# Patient Record
Sex: Female | Born: 1940 | Race: Black or African American | Hispanic: No | Smoking: Never smoker
Health system: Southern US, Community
[De-identification: ages and names within clinical notes are randomized; demographics above are authoritative.]

## PROBLEM LIST (undated history)

## (undated) DIAGNOSIS — D860 Sarcoidosis of lung: Secondary | ICD-10-CM

## (undated) DIAGNOSIS — B029 Zoster without complications: Secondary | ICD-10-CM

## (undated) DIAGNOSIS — D649 Anemia, unspecified: Secondary | ICD-10-CM

## (undated) DIAGNOSIS — F419 Anxiety disorder, unspecified: Secondary | ICD-10-CM

## (undated) DIAGNOSIS — E785 Hyperlipidemia, unspecified: Secondary | ICD-10-CM

## (undated) DIAGNOSIS — I10 Essential (primary) hypertension: Secondary | ICD-10-CM

## (undated) DIAGNOSIS — C50919 Malignant neoplasm of unspecified site of unspecified female breast: Secondary | ICD-10-CM

## (undated) DIAGNOSIS — I472 Ventricular tachycardia, unspecified: Secondary | ICD-10-CM

## (undated) DIAGNOSIS — K219 Gastro-esophageal reflux disease without esophagitis: Secondary | ICD-10-CM

## (undated) DIAGNOSIS — G629 Polyneuropathy, unspecified: Secondary | ICD-10-CM

## (undated) DIAGNOSIS — Z9221 Personal history of antineoplastic chemotherapy: Secondary | ICD-10-CM

## (undated) DIAGNOSIS — I4729 Other ventricular tachycardia: Secondary | ICD-10-CM

## (undated) DIAGNOSIS — Z923 Personal history of irradiation: Secondary | ICD-10-CM

## (undated) DIAGNOSIS — R079 Chest pain, unspecified: Secondary | ICD-10-CM

## (undated) DIAGNOSIS — C801 Malignant (primary) neoplasm, unspecified: Secondary | ICD-10-CM

## (undated) DIAGNOSIS — R251 Tremor, unspecified: Secondary | ICD-10-CM

## (undated) HISTORY — PX: CHOLECYSTECTOMY: SHX55

## (undated) HISTORY — DX: Other ventricular tachycardia: I47.29

## (undated) HISTORY — DX: Chest pain, unspecified: R07.9

## (undated) HISTORY — PX: SKIN BIOPSY: SHX1

## (undated) HISTORY — DX: Tremor, unspecified: R25.1

## (undated) HISTORY — DX: Anemia, unspecified: D64.9

## (undated) HISTORY — DX: Anxiety disorder, unspecified: F41.9

## (undated) HISTORY — DX: Gastro-esophageal reflux disease without esophagitis: K21.9

## (undated) HISTORY — DX: Hyperlipidemia, unspecified: E78.5

## (undated) HISTORY — DX: Polyneuropathy, unspecified: G62.9

## (undated) HISTORY — DX: Ventricular tachycardia, unspecified: I47.20

## (undated) HISTORY — DX: Sarcoidosis of lung: D86.0

## (undated) HISTORY — PX: TUBAL LIGATION: SHX77

## (undated) HISTORY — DX: Zoster without complications: B02.9

## (undated) HISTORY — DX: Ventricular tachycardia: I47.2

---
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
## 1998-02-04 ENCOUNTER — Other Ambulatory Visit: Admission: RE | Admit: 1998-02-04 | Discharge: 1998-02-04 | Payer: Self-pay | Admitting: Emergency Medicine

## 1998-07-25 ENCOUNTER — Emergency Department (HOSPITAL_COMMUNITY): Admission: EM | Admit: 1998-07-25 | Discharge: 1998-07-25 | Payer: Self-pay | Admitting: Emergency Medicine

## 1998-08-24 ENCOUNTER — Emergency Department (HOSPITAL_COMMUNITY): Admission: EM | Admit: 1998-08-24 | Discharge: 1998-08-24 | Payer: Self-pay | Admitting: Emergency Medicine

## 1998-10-03 ENCOUNTER — Observation Stay (HOSPITAL_COMMUNITY): Admission: RE | Admit: 1998-10-03 | Discharge: 1998-10-04 | Payer: Self-pay

## 1999-04-04 ENCOUNTER — Other Ambulatory Visit: Admission: RE | Admit: 1999-04-04 | Discharge: 1999-04-04 | Payer: Self-pay | Admitting: Emergency Medicine

## 1999-12-10 ENCOUNTER — Ambulatory Visit (HOSPITAL_COMMUNITY): Admission: RE | Admit: 1999-12-10 | Discharge: 1999-12-10 | Payer: Self-pay | Admitting: Emergency Medicine

## 1999-12-12 ENCOUNTER — Ambulatory Visit (HOSPITAL_BASED_OUTPATIENT_CLINIC_OR_DEPARTMENT_OTHER): Admission: RE | Admit: 1999-12-12 | Discharge: 1999-12-12 | Payer: Self-pay

## 1999-12-12 ENCOUNTER — Encounter (INDEPENDENT_AMBULATORY_CARE_PROVIDER_SITE_OTHER): Payer: Self-pay | Admitting: Specialist

## 2000-06-03 ENCOUNTER — Encounter: Payer: Self-pay | Admitting: Emergency Medicine

## 2000-06-03 ENCOUNTER — Encounter: Admission: RE | Admit: 2000-06-03 | Discharge: 2000-06-03 | Payer: Self-pay | Admitting: Emergency Medicine

## 2000-06-09 ENCOUNTER — Encounter: Admission: RE | Admit: 2000-06-09 | Discharge: 2000-06-09 | Payer: Self-pay | Admitting: Emergency Medicine

## 2000-06-09 ENCOUNTER — Encounter: Payer: Self-pay | Admitting: Emergency Medicine

## 2001-04-05 ENCOUNTER — Encounter: Payer: Self-pay | Admitting: Emergency Medicine

## 2001-04-05 ENCOUNTER — Encounter: Admission: RE | Admit: 2001-04-05 | Discharge: 2001-04-05 | Payer: Self-pay | Admitting: Emergency Medicine

## 2001-07-26 ENCOUNTER — Ambulatory Visit (HOSPITAL_COMMUNITY): Admission: RE | Admit: 2001-07-26 | Discharge: 2001-07-26 | Payer: Self-pay | Admitting: Internal Medicine

## 2002-08-10 ENCOUNTER — Emergency Department (HOSPITAL_COMMUNITY): Admission: EM | Admit: 2002-08-10 | Discharge: 2002-08-10 | Payer: Self-pay | Admitting: Emergency Medicine

## 2002-10-19 DIAGNOSIS — C50919 Malignant neoplasm of unspecified site of unspecified female breast: Secondary | ICD-10-CM

## 2002-10-19 DIAGNOSIS — Z9221 Personal history of antineoplastic chemotherapy: Secondary | ICD-10-CM

## 2002-10-19 DIAGNOSIS — Z923 Personal history of irradiation: Secondary | ICD-10-CM

## 2002-10-19 HISTORY — DX: Personal history of irradiation: Z92.3

## 2002-10-19 HISTORY — DX: Malignant neoplasm of unspecified site of unspecified female breast: C50.919

## 2002-10-19 HISTORY — PX: OTHER SURGICAL HISTORY: SHX169

## 2002-10-19 HISTORY — DX: Personal history of antineoplastic chemotherapy: Z92.21

## 2002-10-19 HISTORY — PX: BREAST LUMPECTOMY: SHX2

## 2003-04-04 ENCOUNTER — Encounter (INDEPENDENT_AMBULATORY_CARE_PROVIDER_SITE_OTHER): Payer: Self-pay | Admitting: Diagnostic Radiology

## 2003-04-04 ENCOUNTER — Encounter (INDEPENDENT_AMBULATORY_CARE_PROVIDER_SITE_OTHER): Payer: Self-pay | Admitting: *Deleted

## 2003-04-04 ENCOUNTER — Encounter: Payer: Self-pay | Admitting: Internal Medicine

## 2003-04-04 ENCOUNTER — Encounter: Admission: RE | Admit: 2003-04-04 | Discharge: 2003-04-04 | Payer: Self-pay | Admitting: Internal Medicine

## 2003-04-04 ENCOUNTER — Other Ambulatory Visit: Admission: RE | Admit: 2003-04-04 | Discharge: 2003-04-04 | Payer: Self-pay

## 2003-04-20 ENCOUNTER — Encounter: Admission: RE | Admit: 2003-04-20 | Discharge: 2003-04-20 | Payer: Self-pay | Admitting: General Surgery

## 2003-04-20 ENCOUNTER — Encounter: Payer: Self-pay | Admitting: General Surgery

## 2003-04-24 ENCOUNTER — Encounter: Payer: Self-pay | Admitting: General Surgery

## 2003-04-24 ENCOUNTER — Encounter (INDEPENDENT_AMBULATORY_CARE_PROVIDER_SITE_OTHER): Payer: Self-pay | Admitting: *Deleted

## 2003-04-24 ENCOUNTER — Ambulatory Visit (HOSPITAL_BASED_OUTPATIENT_CLINIC_OR_DEPARTMENT_OTHER): Admission: RE | Admit: 2003-04-24 | Discharge: 2003-04-25 | Payer: Self-pay | Admitting: General Surgery

## 2003-05-15 ENCOUNTER — Ambulatory Visit: Admission: RE | Admit: 2003-05-15 | Discharge: 2003-06-29 | Payer: Self-pay | Admitting: *Deleted

## 2003-05-18 ENCOUNTER — Ambulatory Visit (HOSPITAL_COMMUNITY): Admission: RE | Admit: 2003-05-18 | Discharge: 2003-05-18 | Payer: Self-pay | Admitting: Oncology

## 2003-05-18 ENCOUNTER — Encounter: Payer: Self-pay | Admitting: Oncology

## 2003-05-23 ENCOUNTER — Encounter: Payer: Self-pay | Admitting: Cardiology

## 2003-05-23 ENCOUNTER — Ambulatory Visit: Admission: RE | Admit: 2003-05-23 | Discharge: 2003-05-23 | Payer: Self-pay | Admitting: Oncology

## 2003-06-18 ENCOUNTER — Ambulatory Visit (HOSPITAL_COMMUNITY): Admission: RE | Admit: 2003-06-18 | Discharge: 2003-06-18 | Payer: Self-pay | Admitting: General Surgery

## 2003-06-18 ENCOUNTER — Encounter: Payer: Self-pay | Admitting: General Surgery

## 2003-08-15 ENCOUNTER — Other Ambulatory Visit: Admission: RE | Admit: 2003-08-15 | Discharge: 2003-08-15 | Payer: Self-pay | Admitting: Oncology

## 2003-10-23 ENCOUNTER — Ambulatory Visit: Admission: RE | Admit: 2003-10-23 | Discharge: 2004-01-02 | Payer: Self-pay | Admitting: *Deleted

## 2003-10-29 ENCOUNTER — Ambulatory Visit (HOSPITAL_BASED_OUTPATIENT_CLINIC_OR_DEPARTMENT_OTHER): Admission: RE | Admit: 2003-10-29 | Discharge: 2003-10-29 | Payer: Self-pay | Admitting: General Surgery

## 2004-02-01 ENCOUNTER — Ambulatory Visit: Admission: RE | Admit: 2004-02-01 | Discharge: 2004-02-01 | Payer: Self-pay | Admitting: *Deleted

## 2004-04-07 ENCOUNTER — Encounter: Admission: RE | Admit: 2004-04-07 | Discharge: 2004-04-07 | Payer: Self-pay | Admitting: Oncology

## 2004-08-28 ENCOUNTER — Ambulatory Visit (HOSPITAL_COMMUNITY): Admission: RE | Admit: 2004-08-28 | Discharge: 2004-08-28 | Payer: Self-pay | Admitting: Oncology

## 2004-12-08 ENCOUNTER — Ambulatory Visit: Payer: Self-pay | Admitting: Internal Medicine

## 2005-01-05 ENCOUNTER — Ambulatory Visit: Payer: Self-pay | Admitting: Internal Medicine

## 2005-01-19 ENCOUNTER — Ambulatory Visit: Payer: Self-pay | Admitting: Oncology

## 2005-01-23 ENCOUNTER — Emergency Department (HOSPITAL_COMMUNITY): Admission: EM | Admit: 2005-01-23 | Discharge: 2005-01-23 | Payer: Self-pay | Admitting: Emergency Medicine

## 2005-02-06 ENCOUNTER — Ambulatory Visit: Payer: Self-pay | Admitting: Internal Medicine

## 2005-04-08 ENCOUNTER — Encounter: Admission: RE | Admit: 2005-04-08 | Discharge: 2005-04-08 | Payer: Self-pay | Admitting: Oncology

## 2005-04-30 ENCOUNTER — Ambulatory Visit: Payer: Self-pay | Admitting: Pulmonary Disease

## 2005-06-02 ENCOUNTER — Ambulatory Visit: Payer: Self-pay | Admitting: Internal Medicine

## 2005-07-20 ENCOUNTER — Ambulatory Visit: Payer: Self-pay | Admitting: Oncology

## 2005-07-27 ENCOUNTER — Ambulatory Visit (HOSPITAL_COMMUNITY): Admission: RE | Admit: 2005-07-27 | Discharge: 2005-07-27 | Payer: Self-pay | Admitting: Oncology

## 2005-08-26 ENCOUNTER — Ambulatory Visit: Payer: Self-pay | Admitting: Internal Medicine

## 2005-10-01 ENCOUNTER — Ambulatory Visit: Payer: Self-pay | Admitting: Internal Medicine

## 2005-11-10 ENCOUNTER — Ambulatory Visit: Payer: Self-pay | Admitting: Internal Medicine

## 2006-01-19 ENCOUNTER — Ambulatory Visit: Payer: Self-pay | Admitting: Oncology

## 2006-01-20 LAB — COMPREHENSIVE METABOLIC PANEL
AST: 30 U/L (ref 0–37)
Alkaline Phosphatase: 120 U/L — ABNORMAL HIGH (ref 39–117)
BUN: 12 mg/dL (ref 6–23)
Creatinine, Ser: 0.8 mg/dL (ref 0.4–1.2)
Glucose, Bld: 105 mg/dL — ABNORMAL HIGH (ref 70–99)
Potassium: 4.4 mEq/L (ref 3.5–5.3)
Total Bilirubin: 0.5 mg/dL (ref 0.3–1.2)

## 2006-01-20 LAB — CBC WITH DIFFERENTIAL/PLATELET
Basophils Absolute: 0 10*3/uL (ref 0.0–0.1)
EOS%: 2.4 % (ref 0.0–7.0)
Eosinophils Absolute: 0.1 10*3/uL (ref 0.0–0.5)
HGB: 12.9 g/dL (ref 11.6–15.9)
LYMPH%: 20.1 % (ref 14.0–48.0)
MCH: 28.5 pg (ref 26.0–34.0)
MCV: 85.7 fL (ref 81.0–101.0)
MONO%: 12.9 % (ref 0.0–13.0)
Platelets: 181 10*3/uL (ref 145–400)
RDW: 13.7 % (ref 11.3–14.5)

## 2006-01-20 LAB — CANCER ANTIGEN 27.29: CA 27.29: 30 U/mL (ref 0–39)

## 2006-02-23 ENCOUNTER — Ambulatory Visit: Payer: Self-pay | Admitting: Internal Medicine

## 2006-04-09 ENCOUNTER — Encounter: Admission: RE | Admit: 2006-04-09 | Discharge: 2006-04-09 | Payer: Self-pay | Admitting: Oncology

## 2006-06-30 ENCOUNTER — Ambulatory Visit: Payer: Self-pay | Admitting: Internal Medicine

## 2006-07-08 ENCOUNTER — Encounter: Admission: RE | Admit: 2006-07-08 | Discharge: 2006-07-08 | Payer: Self-pay | Admitting: Emergency Medicine

## 2006-07-19 ENCOUNTER — Ambulatory Visit: Payer: Self-pay | Admitting: Oncology

## 2006-08-17 ENCOUNTER — Ambulatory Visit (HOSPITAL_COMMUNITY): Admission: RE | Admit: 2006-08-17 | Discharge: 2006-08-17 | Payer: Self-pay | Admitting: Gastroenterology

## 2006-09-08 ENCOUNTER — Ambulatory Visit: Payer: Self-pay | Admitting: Internal Medicine

## 2006-11-30 ENCOUNTER — Ambulatory Visit: Payer: Self-pay | Admitting: Internal Medicine

## 2007-01-21 ENCOUNTER — Ambulatory Visit: Payer: Self-pay | Admitting: Oncology

## 2007-01-26 LAB — CBC WITH DIFFERENTIAL/PLATELET
BASO%: 0.3 % (ref 0.0–2.0)
Basophils Absolute: 0 10*3/uL (ref 0.0–0.1)
EOS%: 1.8 % (ref 0.0–7.0)
HGB: 13.4 g/dL (ref 11.6–15.9)
MCH: 29 pg (ref 26.0–34.0)
MCHC: 34.4 g/dL (ref 32.0–36.0)
MONO#: 0.6 10*3/uL (ref 0.1–0.9)
RDW: 13.7 % (ref 11.3–14.5)
WBC: 5.5 10*3/uL (ref 3.9–10.0)
lymph#: 1.1 10*3/uL (ref 0.9–3.3)

## 2007-03-07 ENCOUNTER — Ambulatory Visit: Payer: Self-pay | Admitting: Internal Medicine

## 2007-04-12 ENCOUNTER — Encounter: Admission: RE | Admit: 2007-04-12 | Discharge: 2007-04-12 | Payer: Self-pay | Admitting: Oncology

## 2007-05-23 ENCOUNTER — Emergency Department (HOSPITAL_COMMUNITY): Admission: EM | Admit: 2007-05-23 | Discharge: 2007-05-23 | Payer: Self-pay | Admitting: Emergency Medicine

## 2007-07-05 ENCOUNTER — Ambulatory Visit: Payer: Self-pay | Admitting: Internal Medicine

## 2007-07-18 ENCOUNTER — Ambulatory Visit: Payer: Self-pay | Admitting: Oncology

## 2007-07-20 LAB — CBC WITH DIFFERENTIAL/PLATELET
Basophils Absolute: 0 10*3/uL (ref 0.0–0.1)
Eosinophils Absolute: 0.1 10*3/uL (ref 0.0–0.5)
HGB: 13.1 g/dL (ref 11.6–15.9)
NEUT#: 2.6 10*3/uL (ref 1.5–6.5)
RDW: 13.5 % (ref 11.3–14.5)
lymph#: 0.8 10*3/uL — ABNORMAL LOW (ref 0.9–3.3)

## 2007-07-20 LAB — COMPREHENSIVE METABOLIC PANEL
Albumin: 3.6 g/dL (ref 3.5–5.2)
BUN: 14 mg/dL (ref 6–23)
CO2: 33 mEq/L — ABNORMAL HIGH (ref 19–32)
Calcium: 9.4 mg/dL (ref 8.4–10.5)
Chloride: 100 mEq/L (ref 96–112)
Glucose, Bld: 110 mg/dL — ABNORMAL HIGH (ref 70–99)
Potassium: 3.6 mEq/L (ref 3.5–5.3)

## 2007-07-20 LAB — CANCER ANTIGEN 27.29: CA 27.29: 19 U/mL (ref 0–39)

## 2007-11-04 ENCOUNTER — Ambulatory Visit: Payer: Self-pay | Admitting: Internal Medicine

## 2007-11-04 DIAGNOSIS — F419 Anxiety disorder, unspecified: Secondary | ICD-10-CM | POA: Insufficient documentation

## 2007-11-04 DIAGNOSIS — D860 Sarcoidosis of lung: Secondary | ICD-10-CM | POA: Insufficient documentation

## 2007-11-04 DIAGNOSIS — I471 Supraventricular tachycardia: Secondary | ICD-10-CM

## 2007-11-04 DIAGNOSIS — D869 Sarcoidosis, unspecified: Secondary | ICD-10-CM

## 2007-11-04 DIAGNOSIS — C50419 Malignant neoplasm of upper-outer quadrant of unspecified female breast: Secondary | ICD-10-CM

## 2007-11-04 DIAGNOSIS — D649 Anemia, unspecified: Secondary | ICD-10-CM | POA: Insufficient documentation

## 2007-11-04 DIAGNOSIS — K219 Gastro-esophageal reflux disease without esophagitis: Secondary | ICD-10-CM | POA: Insufficient documentation

## 2007-11-04 DIAGNOSIS — F411 Generalized anxiety disorder: Secondary | ICD-10-CM

## 2008-01-16 ENCOUNTER — Ambulatory Visit: Payer: Self-pay | Admitting: Oncology

## 2008-01-17 ENCOUNTER — Inpatient Hospital Stay (HOSPITAL_COMMUNITY): Admission: EM | Admit: 2008-01-17 | Discharge: 2008-01-18 | Payer: Self-pay | Admitting: Emergency Medicine

## 2008-01-18 ENCOUNTER — Encounter: Payer: Self-pay | Admitting: Internal Medicine

## 2008-01-25 ENCOUNTER — Encounter: Payer: Self-pay | Admitting: Internal Medicine

## 2008-01-25 LAB — COMPREHENSIVE METABOLIC PANEL
ALT: 32 U/L (ref 0–35)
AST: 30 U/L (ref 0–37)
CO2: 28 mEq/L (ref 19–32)
Calcium: 9.5 mg/dL (ref 8.4–10.5)
Chloride: 102 mEq/L (ref 96–112)
Sodium: 141 mEq/L (ref 135–145)
Total Bilirubin: 0.4 mg/dL (ref 0.3–1.2)
Total Protein: 7.3 g/dL (ref 6.0–8.3)

## 2008-01-25 LAB — CBC WITH DIFFERENTIAL/PLATELET
BASO%: 0.4 % (ref 0.0–2.0)
EOS%: 1.7 % (ref 0.0–7.0)
MCHC: 33.9 g/dL (ref 32.0–36.0)
MONO#: 0.5 10*3/uL (ref 0.1–0.9)
RBC: 4.38 10*6/uL (ref 3.70–5.32)
WBC: 5.2 10*3/uL (ref 3.9–10.0)
lymph#: 0.8 10*3/uL — ABNORMAL LOW (ref 0.9–3.3)

## 2008-01-25 LAB — LACTATE DEHYDROGENASE: LDH: 163 U/L (ref 94–250)

## 2008-04-12 ENCOUNTER — Encounter: Admission: RE | Admit: 2008-04-12 | Discharge: 2008-04-12 | Payer: Self-pay | Admitting: Internal Medicine

## 2008-07-12 ENCOUNTER — Ambulatory Visit: Payer: Self-pay | Admitting: Oncology

## 2008-07-19 LAB — CBC WITH DIFFERENTIAL/PLATELET
BASO%: 0.1 % (ref 0.0–2.0)
Basophils Absolute: 0 10*3/uL (ref 0.0–0.1)
EOS%: 2.4 % (ref 0.0–7.0)
Eosinophils Absolute: 0.2 10*3/uL (ref 0.0–0.5)
HCT: 40 % (ref 34.8–46.6)
HGB: 13.3 g/dL (ref 11.6–15.9)
LYMPH%: 18.8 % (ref 14.0–48.0)
MCH: 28.4 pg (ref 26.0–34.0)
MCHC: 33.1 g/dL (ref 32.0–36.0)
MCV: 85.7 fL (ref 81.0–101.0)
MONO#: 0.8 10*3/uL (ref 0.1–0.9)
MONO%: 11.9 % (ref 0.0–13.0)
NEUT#: 4.4 10*3/uL (ref 1.5–6.5)
NEUT%: 66.8 % (ref 39.6–76.8)
Platelets: 183 10*3/uL (ref 145–400)
RBC: 4.67 10*6/uL (ref 3.70–5.32)
RDW: 13.5 % (ref 11.3–14.5)
WBC: 6.6 10*3/uL (ref 3.9–10.0)
lymph#: 1.2 10*3/uL (ref 0.9–3.3)

## 2008-07-20 LAB — COMPREHENSIVE METABOLIC PANEL
Albumin: 4.1 g/dL (ref 3.5–5.2)
CO2: 28 mEq/L (ref 19–32)
Calcium: 9.4 mg/dL (ref 8.4–10.5)
Chloride: 98 mEq/L (ref 96–112)
Glucose, Bld: 100 mg/dL — ABNORMAL HIGH (ref 70–99)
Potassium: 3.7 mEq/L (ref 3.5–5.3)
Sodium: 138 mEq/L (ref 135–145)
Total Protein: 7.4 g/dL (ref 6.0–8.3)

## 2008-07-20 LAB — CANCER ANTIGEN 27.29: CA 27.29: 24 U/mL (ref 0–39)

## 2008-07-30 ENCOUNTER — Ambulatory Visit: Payer: Self-pay | Admitting: Internal Medicine

## 2008-07-30 DIAGNOSIS — J441 Chronic obstructive pulmonary disease with (acute) exacerbation: Secondary | ICD-10-CM

## 2008-12-20 ENCOUNTER — Telehealth (INDEPENDENT_AMBULATORY_CARE_PROVIDER_SITE_OTHER): Payer: Self-pay | Admitting: *Deleted

## 2008-12-20 ENCOUNTER — Emergency Department (HOSPITAL_COMMUNITY): Admission: EM | Admit: 2008-12-20 | Discharge: 2008-12-20 | Payer: Self-pay | Admitting: Emergency Medicine

## 2008-12-25 ENCOUNTER — Encounter: Payer: Self-pay | Admitting: Internal Medicine

## 2009-01-07 ENCOUNTER — Ambulatory Visit: Payer: Self-pay | Admitting: Internal Medicine

## 2009-01-22 ENCOUNTER — Ambulatory Visit: Payer: Self-pay | Admitting: Oncology

## 2009-01-24 LAB — COMPREHENSIVE METABOLIC PANEL
Albumin: 4.2 g/dL (ref 3.5–5.2)
Alkaline Phosphatase: 124 U/L — ABNORMAL HIGH (ref 39–117)
BUN: 19 mg/dL (ref 6–23)
Glucose, Bld: 117 mg/dL — ABNORMAL HIGH (ref 70–99)
Potassium: 3.3 mEq/L — ABNORMAL LOW (ref 3.5–5.3)
Total Bilirubin: 0.3 mg/dL (ref 0.3–1.2)

## 2009-01-24 LAB — CBC WITH DIFFERENTIAL/PLATELET
Basophils Absolute: 0 10*3/uL (ref 0.0–0.1)
Eosinophils Absolute: 0.2 10*3/uL (ref 0.0–0.5)
HCT: 37.3 % (ref 34.8–46.6)
HGB: 12.6 g/dL (ref 11.6–15.9)
LYMPH%: 20.9 % (ref 14.0–49.7)
MCV: 85.1 fL (ref 79.5–101.0)
MONO%: 11.4 % (ref 0.0–14.0)
NEUT#: 4.3 10*3/uL (ref 1.5–6.5)
NEUT%: 65.2 % (ref 38.4–76.8)
Platelets: 191 10*3/uL (ref 145–400)

## 2009-01-25 ENCOUNTER — Encounter: Payer: Self-pay | Admitting: Internal Medicine

## 2009-01-31 ENCOUNTER — Encounter: Payer: Self-pay | Admitting: Internal Medicine

## 2009-04-15 ENCOUNTER — Encounter: Admission: RE | Admit: 2009-04-15 | Discharge: 2009-04-15 | Payer: Self-pay | Admitting: Oncology

## 2009-06-02 ENCOUNTER — Emergency Department (HOSPITAL_COMMUNITY): Admission: EM | Admit: 2009-06-02 | Discharge: 2009-06-02 | Payer: Self-pay | Admitting: Emergency Medicine

## 2009-08-28 ENCOUNTER — Telehealth: Payer: Self-pay | Admitting: Internal Medicine

## 2009-08-30 ENCOUNTER — Ambulatory Visit: Payer: Self-pay | Admitting: Internal Medicine

## 2009-08-30 DIAGNOSIS — G250 Essential tremor: Secondary | ICD-10-CM

## 2010-02-27 ENCOUNTER — Ambulatory Visit: Payer: Self-pay | Admitting: Internal Medicine

## 2010-02-27 DIAGNOSIS — H409 Unspecified glaucoma: Secondary | ICD-10-CM | POA: Insufficient documentation

## 2010-04-08 DIAGNOSIS — H6120 Impacted cerumen, unspecified ear: Secondary | ICD-10-CM | POA: Insufficient documentation

## 2010-04-16 ENCOUNTER — Encounter: Admission: RE | Admit: 2010-04-16 | Discharge: 2010-04-16 | Payer: Self-pay | Admitting: Surgery

## 2010-05-05 ENCOUNTER — Emergency Department (HOSPITAL_COMMUNITY): Admission: EM | Admit: 2010-05-05 | Discharge: 2010-05-05 | Payer: Self-pay | Admitting: Emergency Medicine

## 2010-07-22 ENCOUNTER — Telehealth (INDEPENDENT_AMBULATORY_CARE_PROVIDER_SITE_OTHER): Payer: Self-pay | Admitting: *Deleted

## 2010-08-02 ENCOUNTER — Emergency Department (HOSPITAL_COMMUNITY)
Admission: EM | Admit: 2010-08-02 | Discharge: 2010-08-02 | Payer: Self-pay | Source: Home / Self Care | Admitting: Emergency Medicine

## 2010-11-09 ENCOUNTER — Encounter: Payer: Self-pay | Admitting: Oncology

## 2010-11-18 NOTE — Assessment & Plan Note (Signed)
Summary: rov 6 months///kp   Primary Provider/Referring Provider:  R. Avva  CC:  Follow up visit-sarcoidosis;"breathing okay"..  History of Present Illness:  01/07/09- Sarcoid, chronic bronchitis, hx breast ca 2 weeks ago went to ER for rash. they attrib to sarcoid and put her on pred taper. Had seen Dr Felipa Eth first and was given triamcinolone cream which darkened her skin. Went to Dr Terri Piedra for derm bx with result pending. Rash mostly on arms, and some on left leg. Pruritic at first but no longer. Chest ok without cough, fever or night sweats, no swollen glands.  August 30, 2009- Sarcoid, chronic bronchits, hx breast ca, tachycardia Had sharp pains across shoulder blades. She saw cardiologist Dr Lynnea Ferrier yest -"OK" . He had recommended surgical evaluation ? ablation, but for now she is on diltiazem which makes her tired. Pain is now gone. It started while sitting on bed 2 days ago. She is off prednisone, and never filled Xopenex HFA, Denies wheeze or cough. Tremor is chronic- bothersome. We discussed trial of temazepam. Needs flu vax  Feb 27, 2010- Sarcoid, chronic bronchitis, hx breast ca, tachycardia CXR- 08/30/09- Stable sarcoid and XRT changes in right lung. Had heart cath - Dr Lynnea Ferrier- last Fall, and stress test in April " both good". Coughs a little with scant phlegm.. Notes some dyspnea with exertion but unchanged. Denies chest pain, nodes, fever, blood.      Current Medications (verified): 1)  Lotemax 0.5 %  Susp (Loteprednol Etabonate) .... As Directed 2)  Timoptic 0.5 % Soln (Timolol Maleate) .... Place 1 Drop Into Left Eye Every Morning 3)  Hydrochlorothiazide 12.5 Mg  Tabs (Hydrochlorothiazide) .... Take 1 Tablet By Mouth Once A Day 4)  Diovan 160 Mg  Tabs (Valsartan) .... Take 1 Tablet By Mouth Once A Day 5)  Omeprazole 20 Mg Cpdr (Omeprazole) .... Take 1 By Mouth Once Daily For Reflux 6)  Aspirin 81 Mg Tbec (Aspirin) .... Take 1 By Mouth Once Daily 7)  Diazepam 2 Mg  Tabs (Diazepam) .[name].. 1 or 2, Up To Three Times A Day As Needed Tremor  Allergies (verified): 1)  ! Claritin 2)  ! 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  Married- separated x 30 yrs  Risk Factors: Smoking Status: never (01/07/2009)  Review of Systems      See HPI       The patient complains of dyspnea.  The patient denies fever, chills, cough, hempotysis, wheezing, chest discomfort, pleuritic chest pain, cyanosis, weakness, fatigue, joint pain/swelling, muscle pain, photosensitivity, dry eyes, dry mouth, and Raynaud's phenomenon.    Vital Signs:  Patient profile:   70 year old female Height:      65 inches Weight:      144 pounds BMI:     24.05 O2 Sat:      100 % on Room air Pulse rate:   96 / minute BP sitting:   120 / 68  (left arm) Cuff size:   regular  Vitals Entered By: Reynaldo Minium CMA (Feb 27, 2010 9:49 AM)  O2 Flow:  Room air  Physical Exam  Additional Exam:  General: A/Ox3; pleasant and cooperative, NAD, SKIN:clear NODES: no  lymphadenopathy HEENT: [place]/AT, EOM- WNL, Conjuctivae- clear, PERRLA, TM-WNL, Nose- clear, Throat- clear and wnl, missing teeth NECK: Supple w/ fair ROM, JVD- none, normal carotid impulses w/o bruits Thyroid- normal to palpation CHEST: Unlabored diffuse wheeze and squeeks right posterior - she doesn't hear it, old Port A Cath scar left ant chest. HEART: RRR, no m/g/r heard ABDOMEN: ZOX:WRUE, nl pulses, no edema  NEURO: Grossly intact to observation        Impression &  Recommendations:  Problem # 1:  PULMONARY SARCOIDOSIS (ICD-135) Old scarring but relatively asymptomatic. Watching bronchoconstrictor potential from her timoptic, but she seems not to be having a problem. PFT was reviewed from 2009. I don't think we would gain much for her with a bronchodilator. We will continue to watch at long intervals.  Medications Added to Medication List This Visit: 1)  Aspirin 81 Mg Tbec (Aspirin) .... Take 1 by mouth once daily  Other Orders: Est. Patient Level III (45409)  Patient Instructions: 1)  Please schedule a follow-up appointment in 1 year. 2)  Call for help sooner if needed.

## 2010-11-18 NOTE — Progress Notes (Signed)
Summary: diazepam rx > ok per CDY  Phone Note Call from Patient   Caller: Patient Call For: young Summary of Call: pt says her rx ran out [facility] diazepam. [facility] [address] [address] pt # [id number] Initial call taken by: Tivis Ringer, CNA,  July 22, 2010 2:12 PM  Follow-up for Phone Call        pt last seen by [facility] 02/27/2010.  Next appt scheduled for 02/28/2011.  Pt was last given rx for Diazepam 08/30/2009 for # 30 x 5 refills.  Please advise if ok to refill or not.  Thank you. Arman Filter LPN  July 22, 2010 2:22 PM   Additional Follow-up for Phone Call Additional follow up Details #1::        OK to refill the diazepam script as we did las November Additional Follow-up by: Waymon Budge MD,  July 22, 2010 5:25 PM    Additional Follow-up for Phone Call Additional follow up Details #2::    called spoke with patient, advised of pending rx okayed per CDY.  pt verbalized her understanding. Boone Master CNA/MA  July 22, 2010 5:30 PM   Prescriptions: DIAZEPAM 2 MG TABS (DIAZEPAM) 1 or 2, up to three times a day as needed tremor  #30 x 5   Entered by:   Boone Master CNA/MA   Authorized by:   Waymon Budge MD   Signed by:   Boone Master CNA/MA on 07/22/2010   Method used:   Telephoned to ...       [facility]  [phone]* (retail)       [address] [place], [place]  [address]       Ph: [phone] or [phone]       Fax: [phone]   RxID:   [id number]

## 2010-12-29 ENCOUNTER — Ambulatory Visit (INDEPENDENT_AMBULATORY_CARE_PROVIDER_SITE_OTHER): Payer: Medicare Other | Admitting: Internal Medicine

## 2010-12-29 ENCOUNTER — Ambulatory Visit (INDEPENDENT_AMBULATORY_CARE_PROVIDER_SITE_OTHER)
Admission: RE | Admit: 2010-12-29 | Discharge: 2010-12-29 | Disposition: A | Discharge status: Home / Self Care | Payer: Medicare Other | Source: Ambulatory Visit | Attending: Internal Medicine | Admitting: Internal Medicine

## 2010-12-29 ENCOUNTER — Encounter: Payer: Self-pay | Admitting: Internal Medicine

## 2010-12-29 ENCOUNTER — Other Ambulatory Visit: Payer: Self-pay | Admitting: Internal Medicine

## 2010-12-29 DIAGNOSIS — D869 Sarcoidosis, unspecified: Secondary | ICD-10-CM

## 2010-12-29 DIAGNOSIS — J42 Unspecified chronic bronchitis: Secondary | ICD-10-CM

## 2010-12-29 DIAGNOSIS — C50919 Malignant neoplasm of unspecified site of unspecified female breast: Secondary | ICD-10-CM

## 2010-12-29 DIAGNOSIS — K219 Gastro-esophageal reflux disease without esophagitis: Secondary | ICD-10-CM

## 2010-12-30 ENCOUNTER — Telehealth (INDEPENDENT_AMBULATORY_CARE_PROVIDER_SITE_OTHER): Payer: Self-pay | Admitting: *Deleted

## 2010-12-31 LAB — DIFFERENTIAL
Basophils Absolute: 0 10*3/uL (ref 0.0–0.1)
Basophils Relative: 0 % (ref 0–1)
Eosinophils Absolute: 0.1 10*3/uL (ref 0.0–0.7)
Eosinophils Relative: 2 % (ref 0–5)
Monocytes Absolute: 0.6 10*3/uL (ref 0.1–1.0)
Neutro Abs: 5.1 10*3/uL (ref 1.7–7.7)

## 2010-12-31 LAB — URINALYSIS, ROUTINE W REFLEX MICROSCOPIC
Glucose, UA: NEGATIVE mg/dL
Protein, ur: NEGATIVE mg/dL

## 2010-12-31 LAB — URINE CULTURE
Colony Count: 75000
Culture  Setup Time: 201110151920

## 2010-12-31 LAB — CBC
HCT: 39.9 % (ref 36.0–46.0)
MCH: 28.2 pg (ref 26.0–34.0)
MCHC: 33.1 g/dL (ref 30.0–36.0)
MCV: 85.1 fL (ref 78.0–100.0)
RDW: 14.1 % (ref 11.5–15.5)

## 2010-12-31 LAB — URINE MICROSCOPIC-ADD ON

## 2010-12-31 LAB — GLUCOSE, CAPILLARY: Glucose-Capillary: 114 mg/dL — ABNORMAL HIGH (ref 70–99)

## 2011-01-03 LAB — URINALYSIS, ROUTINE W REFLEX MICROSCOPIC
Bilirubin Urine: NEGATIVE
Ketones, ur: NEGATIVE mg/dL
Nitrite: NEGATIVE
Urobilinogen, UA: 0.2 mg/dL (ref 0.0–1.0)

## 2011-01-03 LAB — COMPREHENSIVE METABOLIC PANEL
ALT: 19 U/L (ref 0–35)
Albumin: 3.9 g/dL (ref 3.5–5.2)
Alkaline Phosphatase: 105 U/L (ref 39–117)
BUN: 13 mg/dL (ref 6–23)
Chloride: 102 mEq/L (ref 96–112)
Glucose, Bld: 115 mg/dL — ABNORMAL HIGH (ref 70–99)
Potassium: 3.4 mEq/L — ABNORMAL LOW (ref 3.5–5.1)
Sodium: 141 mEq/L (ref 135–145)
Total Bilirubin: 1 mg/dL (ref 0.3–1.2)

## 2011-01-03 LAB — GLUCOSE, CAPILLARY: Glucose-Capillary: 125 mg/dL — ABNORMAL HIGH (ref 70–99)

## 2011-01-06 NOTE — Progress Notes (Signed)
Summary: pt would like results of chest xray from yesterday  Phone Note Call from Patient   Caller: Patient Call For: Young Summary of Call: Patient phoned stated that she was seen yesterday and had a chest xray and she would like the results. She can be reached at [phone] Initial call taken by: Vedia Coffer,  December 30, 2010 3:11 PM  Follow-up for Phone Call        called and spoke with pt.  pt aware of cxr results.  Arman Filter LPN  December 30, 2010 3:37 PM

## 2011-01-06 NOTE — Assessment & Plan Note (Signed)
Summary: cough///kp   Primary Provider/Referring Provider:  R. Avva  CC:  Acute visit-cough-white in color and pain on both sides under rib cage area and as well as chills; denies any SOB or wheezing or fever.[name]  History of Present Illness: August 30, 2009- Sarcoid, chronic bronchits, hx breast ca, tachycardia Had sharp pains across shoulder blades. She saw cardiologist Dr Lynnea Ferrier yest -"OK" . He had recommended surgical evaluation ? ablation, but for now she is on diltiazem which makes her tired. Pain is now gone. It started while sitting on bed 2 days ago. She is off prednisone, and never filled Xopenex HFA, Denies wheeze or cough. Tremor is chronic- bothersome. We discussed trial of temazepam. Needs flu vax  Feb 27, 2010- Sarcoid, chronic bronchitis, hx breast ca, tachycardia CXR- 08/30/09- Stable sarcoid and XRT changes in right lung. Had heart cath - Dr Lynnea Ferrier- last Fall, and stress test in April " both good". Coughs a little with scant phlegm.. Notes some dyspnea with exertion but unchanged. Denies chest pain, nodes, fever, blood.  01-15-2011- Sarcoid, chronic bronchitis, hx breast ca, tachycardia Nurse-CC: Acute visit-cough-white in color and pain on both sides under rib cage area, as well as chills; denies any SOB or wheezing or fever. acute- 2 weeks w/o distinct onset. Pains under lateral rib edges, dry cough, eyes water, blowing nose. No sneeze or shortness of breath, sore throat, fever, nodes or hearburn. Sputum white. Chill w/o fever. GI ok.        Preventive Screening-Counseling & Management  Alcohol-Tobacco     Smoking Status: never  Current Medications (verified): 1)  Lotemax 0.5 %  Susp (Loteprednol Etabonate) .... As Directed 2)  Timoptic 0.5 % Soln (Timolol Maleate) .... Place 1 Drop Into Left Eye Every Morning 3)  Hydrochlorothiazide 25 Mg Tabs (Hydrochlorothiazide) .... Take 1 By Mouth Once Daily 4)  Diovan 320 Mg Tabs (Valsartan) .... Take 1 By Mouth  Once Daily 5)  Omeprazole 20 Mg Cpdr (Omeprazole) .... Take 1 By Mouth Once Daily For Reflux 6)  Aspirin 81 Mg Tbec (Aspirin) .... Take 1 By Mouth Once Daily 7)  Simvastatin 40 Mg Tabs (Simvastatin) .... Take 1 By Mouth At Bedtime  Allergies (verified): 1)  ! Claritin 2)  ! 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  The patient denies anorexia, fever, weight gain, vision loss, decreased hearing, hoarseness, syncope, peripheral edema, prolonged cough, headaches, hemoptysis, abdominal pain, melena, severe indigestion/heartburn, muscle weakness, difficulty walking, depression, unusual weight change, and abnormal bleeding.         Dieting weigtht down 5 lbs  Vital Signs:  Patient profile:   70 year old female Height:      65 inches Weight:      139.25 pounds BMI:     23.26 O2 Sat:  99 % on Room air Pulse rate:   87 / minute BP sitting:   126 / 78  (left arm) Cuff size:   regular  Vitals Entered By: Reynaldo Minium CMA (December 29, 2010 10:10 AM)  O2 Flow:  Room air CC: Acute visit-cough-white in color and pain on both sides under rib cage area, as well as chills; denies any SOB or  wheezing or fever.   Physical Exam  Additional Exam:  General: A/Ox3; pleasant and cooperative, NAD, SKIN:clear NODES: no lymphadenopathy HEENT: [place]/AT, EOM- WNL, Conjuctivae- clear, PERRLA, TM-WNL, Nose- clear, Throat- clear and wnl, missing teeth NECK: Supple w/ fair ROM, JVD- none, normal carotid impulses w/o bruits Thyroid- normal to palpation CHEST:Unlabored, faint wheeze, no rub. , old Port A Cath scar left ant chest. HEART: RRR, no m/g/r heard ABDOMEN:not overweight ZOX:WRUE, nl pulses, no edema  NEURO: Grossly intact to observation        Impression & Recommendations:  Problem # 1:  PULMONARY SARCOIDOSIS (ICD-135) Doubt reactivation. current symptoms more typical of a mild bronchitis from viral or allergic trigger.  I reassured her. she doesn't feel sick or worried. We will get CXr and treat symptomatically with tylenol, heating pad and fluids.   Problem # 2:  ESOPHAGEAL REFLUX (ICD-530.81) I reminded her ot typical reflux symptoms and prevention measures.  Her updated medication list for this problem includes:    Omeprazole 20 Mg Cpdr (Omeprazole) .[name]... Take 1 by mouth once daily for reflux  Problem # 3:  CARCINOMA, BREAST (ICD-174.9) She says there has been no sign of recurence and she is off treatment now.   Medications Added to Medication List This Visit: 1)  Hydrochlorothiazide 25 Mg Tabs (Hydrochlorothiazide) .... Take 1 by mouth once daily 2)  Diovan 320 Mg Tabs (Valsartan) .... Take 1 by mouth once daily 3)  Simvastatin 40 Mg Tabs (Simvastatin) .... Take 1 by mouth at bedtime  Other Orders: Est. Patient Level III (45409) T-2 View CXR (71020TC)  Patient Instructions: 1)  Keep scheduled appointment in May unless needed sooner. 2)  A chest x-ray has been recommended.  Your imaging study may require preauthorization.  3)  Treat for comfort- rest, fluids, tylenol, heating pad as needed.

## 2011-01-27 ENCOUNTER — Encounter (HOSPITAL_COMMUNITY): Payer: Self-pay | Admitting: Radiology

## 2011-01-27 ENCOUNTER — Emergency Department (HOSPITAL_COMMUNITY): Payer: Medicare Other

## 2011-01-27 ENCOUNTER — Emergency Department (HOSPITAL_COMMUNITY)
Admission: EM | Admit: 2011-01-27 | Discharge: 2011-01-27 | Disposition: A | Discharge status: Home / Self Care | Payer: Medicare Other | Attending: Emergency Medicine | Admitting: Emergency Medicine

## 2011-01-27 DIAGNOSIS — F411 Generalized anxiety disorder: Secondary | ICD-10-CM | POA: Insufficient documentation

## 2011-01-27 DIAGNOSIS — R079 Chest pain, unspecified: Secondary | ICD-10-CM | POA: Insufficient documentation

## 2011-01-27 DIAGNOSIS — Z79899 Other long term (current) drug therapy: Secondary | ICD-10-CM | POA: Insufficient documentation

## 2011-01-27 DIAGNOSIS — D869 Sarcoidosis, unspecified: Secondary | ICD-10-CM | POA: Insufficient documentation

## 2011-01-27 DIAGNOSIS — I10 Essential (primary) hypertension: Secondary | ICD-10-CM | POA: Insufficient documentation

## 2011-01-27 DIAGNOSIS — R0989 Other specified symptoms and signs involving the circulatory and respiratory systems: Secondary | ICD-10-CM | POA: Insufficient documentation

## 2011-01-27 DIAGNOSIS — R0609 Other forms of dyspnea: Secondary | ICD-10-CM | POA: Insufficient documentation

## 2011-01-27 DIAGNOSIS — R799 Abnormal finding of blood chemistry, unspecified: Secondary | ICD-10-CM | POA: Insufficient documentation

## 2011-01-27 DIAGNOSIS — M25519 Pain in unspecified shoulder: Secondary | ICD-10-CM | POA: Insufficient documentation

## 2011-01-27 DIAGNOSIS — Z853 Personal history of malignant neoplasm of breast: Secondary | ICD-10-CM | POA: Insufficient documentation

## 2011-01-27 DIAGNOSIS — M79609 Pain in unspecified limb: Secondary | ICD-10-CM | POA: Insufficient documentation

## 2011-01-27 HISTORY — DX: Essential (primary) hypertension: I10

## 2011-01-27 HISTORY — DX: Malignant (primary) neoplasm, unspecified: C80.1

## 2011-01-27 LAB — POCT I-STAT, CHEM 8
BUN: 15 mg/dL (ref 6–23)
Calcium, Ion: 1.06 mmol/L — ABNORMAL LOW (ref 1.12–1.32)
HCT: 42 % (ref 36.0–46.0)
Hemoglobin: 14.3 g/dL (ref 12.0–15.0)
Sodium: 136 mEq/L (ref 135–145)
TCO2: 27 mmol/L (ref 0–100)

## 2011-01-27 LAB — DIFFERENTIAL
Basophils Relative: 0 % (ref 0–1)
Eosinophils Absolute: 0.1 10*3/uL (ref 0.0–0.7)
Eosinophils Relative: 2 % (ref 0–5)
Monocytes Relative: 7 % (ref 3–12)
Neutrophils Relative %: 67 % (ref 43–77)

## 2011-01-27 LAB — CBC
MCH: 27.4 pg (ref 26.0–34.0)
MCHC: 32.7 g/dL (ref 30.0–36.0)
MCV: 83.6 fL (ref 78.0–100.0)
Platelets: 164 10*3/uL (ref 150–400)
RBC: 4.64 MIL/uL (ref 3.87–5.11)
RDW: 13.4 % (ref 11.5–15.5)

## 2011-01-27 LAB — POCT CARDIAC MARKERS
CKMB, poc: <1 ng/mL — ABNORMAL LOW (ref 1.0–8.0)
Troponin i, poc: <0.05 ng/mL (ref 0.00–0.09)

## 2011-01-27 MED ORDER — IOHEXOL 300 MG/ML  SOLN
70.0000 mL | Freq: Once | INTRAMUSCULAR | Status: AC | PRN
  Administered 2011-01-27: 70 mL via INTRAVENOUS

## 2011-02-27 ENCOUNTER — Ambulatory Visit: Payer: Self-pay | Admitting: Internal Medicine

## 2011-03-03 NOTE — Discharge Summary (Signed)
NAMEHYDIE, LANGAN NO.:  [id number]   MEDICAL RECORD NO.:  [id number]          PATIENT TYPE:  INP   LOCATION:  3709                         FACILITY:  [facility]   PHYSICIAN:  Lezlie Octave, N.P.     DATE OF BIRTH:  08-Sep-1941   DATE OF ADMISSION:  01/17/2008  DATE OF DISCHARGE:  01/18/2008                               DISCHARGE SUMMARY   Ms. Danielle Peters is a 70 year old African American female patient who  was seen in the office of Dr. Ritta Slot, she was in SVT.  In the  office, she was given carotid massage which did not bring her heart  rate.  She was given metoprolol 50 mg and waited for 1 hour and a half.  However, her heart rate still did not break then she was admitted to the  hospital.  In the ER, she was again in SVT, which was probably atrial  flutter, and she was given 20 mg bolus of diltiazem, and she converted  to sinus rhythm.  It was decided to keep her overnight and adjust her  medications.  We started p.o. diltiazem and beta-blocker, held her HCTZ  and her Diovan.  The following day, she was seen by Dr. Lynnea Ferrier, who  decided she could be discharged home with outpatient follow up.   LABORATORY DATA:  Hemoglobin 13, hematocrit 38.5, WBCs 5.6, and  platelets 140.  Sodium 140, potassium 3.5, BUN 9, creatinine 0.82,  chloride 104, and CO2 30.  Her AST was 58 and ALT was 28.  CK-MBs and  troponins were negative x3.  TSH was 0.812.  Chest x-ray showed a  bihilar adenopathy, compatible sarcoid, scarring in the right anterior  lung, probably due to the prior radiation and no cardiopulmonary  disease.   DISCHARGE MEDICATIONS:  1. Metoprolol 25 mg twice per day.  2. Diltiazem 30 mg twice per day.  3. Aspirin 81 mg a day.  4. Lotemax eye drops.  5. Atenolol eye drops.  6. Prilosec 20 mg a day.   She will follow up in our office for Persantine Myoview on January 30, 2008, and then follow up with Dr. Lynnea Ferrier on January 31, 2008.   DISCHARGE  DIAGNOSES:  1. PA flutter with rapid ventricular response.  2. History of supraventricular tachycardia since 2004.  3. Right breast lumpectomy for breast cancer.  4. Sarcoidosis followed by Dr. Jetty Duhamel.  5. Hypertension.  6. Gastroesophageal reflux disease.      Lezlie Octave, N.P.     BB/MEDQ  D:  02/24/2008  T:  02/25/2008  Job:  [id number]   cc:   Joni Fears D. Young, MD, FCCP, FACP  Avvva R. Joylene Draft, MD

## 2011-03-03 NOTE — Assessment & Plan Note (Signed)
[facility]                             PULMONARY OFFICE NOTE   NAME:Grisso, BELLAMARIE PFLUG                     MRN:          [id number]  DATE:07/05/2007                            DOB:          1941-05-02    PROBLEM LIST:  1. Pulmonary sarcoidosis.  2. Right breast cancer/radiation therapy/chemotherapy.  3. Anemia.  4. Esophageal reflux.  5. Paroxysmal ventricular tachycardia.  6. Anxiety.   HISTORY:  She went to the emergency room in early August because of  chest pain.  Workup there included a chest x-ray with no acute findings  and a chest CT, negative for pulmonary embolism, but now showing changes  of sarcoid.  She was diagnosed with musculoskeletal pain.  She now  thinks she has a cold, mainly because she has been coughing some with  the current cool weather.  There is no fever, adenopathy, sore throat,  or significant sputum.  Her nose has been a bit congested.  She is going  to an aerobic exercise class once a week at the [facility].   MEDICATIONS:  1. Mobic 15 mg.  2. Lotemax.  3. Timolol eye drops.  4. Hydrochlorothiazide 12.5 mg.  5. Diovan 160 mg .  6. Lorazepam 0.5 mg b.i.d.  7. Celexa 20 mg.  8. Flexeril p.r.n. use.  9. Codeine cough syrup.  10.Atrovent inhaler.   Drug intolerant to ASPIRIN, CLARITIN, and ALBUTEROL which had sent her  to the emergency room with tachycardia in the past.   OBJECTIVE:  Weight is up 138 pounds, BP 124/80, pulse 84, room air  saturation 99%.  Squeaks and rhonchi in both posterior bases.  Work of breathing is not  increased.  I do not hear pleural rub or dullness.  I do not find  adenopathy.  Heart sounds are regular without murmur or gallop.  There is no tremor.  Oropharynx is clear.  Nasal airway is not  substantially obstructed.   IMPRESSION:  Anxiety component persists.  She does have significant  pulmonary parenchymal scarring from stage IV sarcoid.  Minor increase in  cough now is  nonspecific, but I do not think it requires we do very  much.  She does ask that we change her cough medicine prescription back  to Tussionex, and after some discussion I did so.   PLAN:  Schedule pulmonary function test.  Tussionex 5 ml q.12 h. p.r.n.  occasional use for cough.  Schedule return 4 months, earlier p.r.n.     Clinton D. Maple Hudson, MD, Tonny Bollman, FACP  Electronically Signed    CDY/MedQ  DD: 07/05/2007  DT: 07/06/2007  Job #: [id number]   cc:   Larina Earthly, M.D.

## 2011-03-03 NOTE — Assessment & Plan Note (Signed)
[facility]                             PULMONARY OFFICE NOTE   NAME:Danielle Peters, Danielle ELLIOTT                     MRN:          [id number]  DATE:03/07/2007                            DOB:          May 04, 1941    PULMONARY OFFICE FOLLOWUP.   PROBLEM LIST:  1. Pulmonary sarcoidosis.  2. Right breast cancer/radiation therapy/chemotherapy.  3. Anemia.  4. Esophageal reflux.  5. Paroxysmal ventricular tachycardia.  6. Anxiety.   HISTORY:  We gave her some 2-mg diazepam for anxiety in February.  She  tried it.  Dr. Felipa Eth preferred that she try Celexa but she says that  gives heartburn.  I would rather leave this management to him.  She  notices some aching in the left intercostal margin and says chest x-ray  and EKG were done to evaluate and that she is due for a followup chest x-  ray.  She was told the blood work is quote excellent.  She had an  upper endoscopy with Dr. Randa Evens in September.   MEDICATIONS:  Her list is reviewed and charted.  Significant today for  Atrovent HFA used p.r.n.  She does timolol eye drops which can aggravate  bronchospasm but she seems to be tolerating it well.   OBJECTIVE:  Weight 122 pounds, blood pressure 130/84, pulse 85, room air  saturation 97%.  A few scattered squeaks and wheezes.  Heart sounds:  Regular without murmur.  I do not find adenopathy.  She is tender to  pressure along the left side of her sternum consistent with  costochondritis, and this seems to duplicate the soreness she was  talking about currently.   IMPRESSION:  1. Burned out sarcoid with mediastinal and hilar adenopathy and some      nodular pleural thickening in the right apex which needs to be      watched.  Chest x-rays are being done elsewhere.  2. Musculoskeletal chest wall pain consistent with costochondritis.   PLAN:  We are scheduling a pulmonary function test.  She will return in  four months.  She is encouraged to follow closely with Dr.  Felipa Eth.    Clinton D. Maple Hudson, MD, Tonny Bollman, FACP  Electronically Signed   CDY/MedQ  DD: 03/14/2007  DT: 03/14/2007  Job #: [id number]   cc:   Larina Earthly, M.D.

## 2011-03-06 NOTE — Op Note (Signed)
[name], Danielle Peters                        ACCOUNT NO.:  [id number]   MEDICAL RECORD NO.:  [id number]                   PATIENT TYPE:  AMB   LOCATION:  DSC                                  FACILITY:  [facility]   PHYSICIAN:  Rose Phi. Maple Hudson, M.D.                DATE OF BIRTH:  18-Jul-1941   DATE OF PROCEDURE:  DATE OF DISCHARGE:                                 OPERATIVE REPORT   PREOPERATIVE DIAGNOSIS:  Carcinoma of the breast.   POSTOPERATIVE DIAGNOSIS:  Carcinoma of the breast.   OPERATION PERFORMED:  Removal of Port-A-Cath.   SURGEON:  Rose Phi. Maple Hudson, M.D.   ANESTHESIA:  Local.   DESCRIPTION OF PROCEDURE:  The patient was placed on the operating table and  the left upper chest prepped and draped in the usual fashion.  After  infiltrating the area with 1% Xylocaine with Adrenalin, an incision was made  and we exposed the port.  I could identify the two sutures holding it in  place, divided those, grasped the catheter and removed it and then removed  the port.  There was no bleeding.  Subcuticular closure with 4-0 Monocryl  and Steri-Strips.  Dressing applied.  The patient was then allowed to go  home.                                               Rose Phi. Maple Hudson, M.D.    PRY/MEDQ  D:  10/29/2003  T:  10/29/2003  Job:  [id number]

## 2011-03-24 ENCOUNTER — Other Ambulatory Visit: Payer: Self-pay | Admitting: Internal Medicine

## 2011-03-24 DIAGNOSIS — Z1231 Encounter for screening mammogram for malignant neoplasm of breast: Secondary | ICD-10-CM

## 2011-04-20 ENCOUNTER — Ambulatory Visit
Admission: RE | Admit: 2011-04-20 | Discharge: 2011-04-20 | Disposition: A | Discharge status: Home / Self Care | Payer: Medicare Other | Source: Ambulatory Visit | Attending: Internal Medicine | Admitting: Internal Medicine

## 2011-04-20 DIAGNOSIS — Z1231 Encounter for screening mammogram for malignant neoplasm of breast: Secondary | ICD-10-CM

## 2011-07-13 LAB — DIFFERENTIAL
Basophils Absolute: 0
Basophils Relative: 0
Eosinophils Absolute: 0
Monocytes Absolute: 0.5
Monocytes Relative: 8
Neutro Abs: 5.3
Neutrophils Relative %: 76

## 2011-07-13 LAB — CARDIAC PANEL(CRET KIN+CKTOT+MB+TROPI)
Relative Index: INVALID
Relative Index: INVALID
Total CK: 78
Troponin I: 0.05

## 2011-07-13 LAB — COMPREHENSIVE METABOLIC PANEL
ALT: 28
Alkaline Phosphatase: 107
BUN: 11
CO2: 24
Chloride: 101
Glucose, Bld: 128 — ABNORMAL HIGH
Potassium: 4.1
Sodium: 138
Total Bilirubin: 0.9
Total Protein: 7.9

## 2011-07-13 LAB — TSH: TSH: 0.88

## 2011-07-13 LAB — CBC
HCT: 45.3
Hemoglobin: 15.1 — ABNORMAL HIGH
RBC: 5.32 — ABNORMAL HIGH
WBC: 7

## 2011-07-13 LAB — PROTIME-INR
INR: 0.9
Prothrombin Time: 12.7

## 2011-07-14 ENCOUNTER — Telehealth: Payer: Self-pay | Admitting: Internal Medicine

## 2011-07-14 LAB — BASIC METABOLIC PANEL
BUN: 9
CO2: 30
Calcium: 9.3
Chloride: 104
Creatinine, Ser: 0.82

## 2011-07-14 LAB — CBC
MCHC: 33.7
MCV: 84.7
Platelets: 140 — ABNORMAL LOW
RDW: 13.2

## 2011-07-14 LAB — CARDIAC PANEL(CRET KIN+CKTOT+MB+TROPI)
Relative Index: INVALID
Troponin I: 0.03

## 2011-07-14 NOTE — Telephone Encounter (Signed)
Spoke with patient-aware to be here Thursday at 11am for 1115am appt with CY for allergies.

## 2011-07-22 ENCOUNTER — Ambulatory Visit: Payer: Medicare Other | Admitting: Rehabilitative and Restorative Service Providers"

## 2011-07-23 ENCOUNTER — Encounter: Payer: Self-pay | Admitting: Internal Medicine

## 2011-07-23 ENCOUNTER — Ambulatory Visit (INDEPENDENT_AMBULATORY_CARE_PROVIDER_SITE_OTHER)
Admission: RE | Admit: 2011-07-23 | Discharge: 2011-07-23 | Disposition: A | Discharge status: Home / Self Care | Payer: Medicare Other | Source: Ambulatory Visit | Attending: Internal Medicine | Admitting: Internal Medicine

## 2011-07-23 ENCOUNTER — Other Ambulatory Visit: Payer: Medicare Other

## 2011-07-23 ENCOUNTER — Ambulatory Visit (INDEPENDENT_AMBULATORY_CARE_PROVIDER_SITE_OTHER): Payer: Medicare Other | Admitting: Internal Medicine

## 2011-07-23 VITALS — BP 138/80 | HR 86 | Ht 65.0 in | Wt 142.0 lb

## 2011-07-23 DIAGNOSIS — D869 Sarcoidosis, unspecified: Secondary | ICD-10-CM

## 2011-07-23 DIAGNOSIS — C50919 Malignant neoplasm of unspecified site of unspecified female breast: Secondary | ICD-10-CM

## 2011-07-23 DIAGNOSIS — Z23 Encounter for immunization: Secondary | ICD-10-CM

## 2011-07-23 NOTE — Progress Notes (Signed)
07/23/11- 70 year old female never smoker followed for sarcoid, chronic bronchitis complicated by history of right breast cancer, tachycardia, GERD, anxiety, glaucoma.. Last here- 12/29/2010. She is here today with concern about an intermittent pain at the inferior aspect of her right scapula, noticed over the past month. It is increased by stress. She also feels tender across the lower left inferior costal margin and she notices occasional heartburn. She recently restarted exercise classes at the [facility]. He denies fever productive cough sore throat nodes or exertional chest pain. She recently had cardiology followup at [facility] where she was told heart problems might be from sarcoid. She doesn't understand anything specific about that.  ROS- Constitutional:   No-   weight loss, night sweats, fevers, chills, fatigue, lassitude. HEENT:   No-  headaches, difficulty swallowing, tooth/dental problems, sore throat,       No-  sneezing, itching, ear ache, nasal congestion, post nasal drip,  CV:  No-   chest pain, orthopnea, PND, swelling in lower extremities, anasarca, dizziness, palpitations Resp: No-   shortness of breath with exertion or at rest.              No-   productive cough,  No non-productive cough,  No-  coughing up of blood.              No-   change in color of mucus.  No- wheezing.   Skin: No-   rash or lesions. GI:  No-   heartburn, indigestion, abdominal pain, nausea, vomiting, diarrhea,                 change in bowel habits, loss of appetite GU: No-   dysuria, change in color of urine, nocturia x3.  No- flank pain. MS:  No-   joint pain or swelling.  No- decreased range of motion.  No- back pain. 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 No depression or anxiety.  No memory loss.  Obj General- Alert, Oriented, Affect-appropriate, Distress- none acute Skin- rash-none, lesions- none, excoriation-  none Lymphadenopathy- none Head- atraumatic            Eyes- Gross vision intact, PERRLA, conjunctivae clear secretions            Ears- Hearing, canals-normal            Nose- Clear, no-Septal dev, mucus, polyps, erosion, perforation             Throat- Mallampati II , mucosa clear , drainage- none, tonsils- atrophic. Missing teeth Neck- flexible , trachea midline, no stridor , thyroid nl, carotid no bruit Chest - symmetrical excursion , unlabored           Heart/CV- RRR with no extra beats , no murmur , no gallop  , no rub, nl s1 s2                           - JVD- none , edema- none, stasis changes- none, varices- none           Lung- coarse wet sounding rhonchi bilaterally to the mid lung zones. Work of breathing is not increased. cough- none , dullness-none, rub- none           Chest wall-  Abd- tender-no, distended-no, bowel sounds-present, HSM- no Br/ Gen/ Rectal- Not done, not indicated Extrem- cyanosis- none, clubbing, none, atrophy- none, strength- nl Neuro- grossly intact to  observation, except for resting tremor of both hands

## 2011-07-23 NOTE — Assessment & Plan Note (Signed)
Treated with radiation therapy chemotherapy and lumpectomy 2004

## 2011-07-23 NOTE — Patient Instructions (Addendum)
Order- lab- ACE level for dx sarcoid                    CXR  Flu vax

## 2011-07-23 NOTE — Assessment & Plan Note (Signed)
At her age I would expect sarcoid to be burned out. We need to understand better. 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 There may be some radiation change underlying the treatment area of her right breast. She has had similar musculoskeletal pains for at least 2 years and I don't think this is related to the sarcoid, or progressive. Plan angiotensin-converting enzyme level, chest x-ray, flu vaccine.

## 2011-08-03 LAB — POCT I-STAT CREATININE
Creatinine, Ser: 0.8
Operator id: 285841

## 2011-08-03 LAB — POCT CARDIAC MARKERS
CKMB, poc: 1.3
Myoglobin, poc: 35.8
Operator id: 285841
Troponin i, poc: <0.05

## 2011-08-03 LAB — I-STAT 8, (EC8 V) (CONVERTED LAB)
Acid-Base Excess: 5 — ABNORMAL HIGH
BUN: 11
Bicarbonate: 30.1 — ABNORMAL HIGH
Chloride: 102
Glucose, Bld: 116 — ABNORMAL HIGH
HCT: 47 — ABNORMAL HIGH
Hemoglobin: 16 — ABNORMAL HIGH
Operator id: 285841
Potassium: 3.9
Sodium: 136
TCO2: 31
pCO2, Ven: 42.9 — ABNORMAL LOW
pH, Ven: 7.455 — ABNORMAL HIGH

## 2011-09-03 ENCOUNTER — Ambulatory Visit (INDEPENDENT_AMBULATORY_CARE_PROVIDER_SITE_OTHER): Payer: Medicare Other | Admitting: Internal Medicine

## 2011-09-03 ENCOUNTER — Encounter: Payer: Self-pay | Admitting: Internal Medicine

## 2011-09-03 VITALS — BP 122/66 | HR 85 | Ht 65.0 in | Wt 142.6 lb

## 2011-09-03 DIAGNOSIS — D869 Sarcoidosis, unspecified: Secondary | ICD-10-CM

## 2011-09-03 NOTE — Patient Instructions (Signed)
Consider Delsym or your favorite otc cough syrup if needed  Please call as needed  Ask your doctor if another medicine could be tried for tremor  We will recheck your chest xray and sarcoid test next time unless you have problems sooner.

## 2011-09-03 NOTE — Progress Notes (Signed)
07/23/11- 70 year old female never smoker followed for sarcoid, chronic bronchitis complicated by history of right breast cancer, tachycardia, GERD, anxiety, glaucoma.. Last here- 12/29/2010. She is here today with concern about an intermittent pain at the inferior aspect of her right scapula, noticed over the past month. It is increased by stress. She also feels tender across the lower left inferior costal margin and she notices occasional heartburn. She recently restarted exercise classes at the [facility]. He denies fever productive cough sore throat nodes or exertional chest pain. She recently had cardiology followup at [facility] heart and vascular where she was told heart problems might be from sarcoid. She doesn't understand anything specific about that.  09/03/11-  70 year old female never smoker followed for sarcoid, chronic bronchitis complicated by history of right breast cancer, tachycardia, GERD, anxiety, glaucoma.. She has felt well with no acute issues. She does comment on annoying tremor of her hands. Treated by neurology with primidone which caused headache and rapid heart rate. Cold air increased her cough a little bit. She does not think she has an infection and denies fever, sweats, swollen glands, pain or purulent discharge. ACE level -07/23/11- 61 which may indicate low grade activity despite her age. CXR- 07/23/2011-patchy density right middle lobe, suspected to be sarcoid by the radiologist. We discussed the fact that this is in the field of radiation therapy for right breast cancer and maybe radiation change in the underlying lung.   ROS- see HPI Constitutional:   No-   weight loss, night sweats, fevers, chills, fatigue, lassitude. HEENT:   No-  headaches, difficulty swallowing, tooth/dental problems, sore throat,       No-  sneezing, itching, ear ache, nasal congestion, post nasal drip,  CV:  No-   chest pain, orthopnea, PND, swelling in lower extremities, anasarca,  dizziness, palpitations Resp: No-   shortness of breath with exertion or at rest.              No-   productive cough,  No non-productive cough,  No-  coughing up of blood.              No-   change in color of mucus.  No- wheezing.   Skin: No-   rash or lesions. GI:  No-   heartburn, indigestion, abdominal pain, nausea, vomiting, diarrhea,                 change in bowel habits, loss of appetite GU: No-   dysuria, change in color of urine, nocturia x3.  No- flank pain. MS:  No-   joint pain or swelling.  No- decreased range of motion.  No- back pain. 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 No depression or anxiety.  No memory loss.  Obj General- Alert, Oriented, Affect-appropriate, Distress- none acute Skin- rash-none, lesions- none, excoriation- none Lymphadenopathy- none Head- atraumatic            Eyes- Gross vision intact, PERRLA, conjunctivae clear secretions            Ears- Hearing, canals-normal            Nose- Clear, no-Septal dev, mucus, polyps, erosion, perforation             Throat- Mallampati II , mucosa clear , drainage- none, tonsils- atrophic. Missing teeth Neck- flexible , trachea midline, no stridor , thyroid nl, carotid no bruit Chest - symmetrical excursion , unlabored  Heart/CV- RRR with no extra beats , no murmur , no gallop  , no rub, nl s1 s2                           - JVD- none , edema- none, stasis changes- none, varices- none           Lung- coarse wet sounding rhonchi especially in the right mid lung. Work of breathing is not increased. cough- none , dullness-none, rub- none           Chest wall-  Abd- tender-no, distended-no, bowel sounds-present, HSM- no Br/ Gen/ Rectal- Not done, not indicated Extrem- cyanosis- none, clubbing, none, atrophy- none, strength- nl Neuro- grossly intact to observation, except for resting tremor of both hands

## 2011-09-06 NOTE — Assessment & Plan Note (Signed)
Right middle lobe infiltrate of uncertain etiology. Sarcoid is possible but at this age, less likely despite her mildly elevated ACE level. This seems to relate to be seen progressive changes related to her radiation therapy, but we need to watch for definite evidence of progression. She understands and agrees to this.

## 2011-12-04 ENCOUNTER — Ambulatory Visit (INDEPENDENT_AMBULATORY_CARE_PROVIDER_SITE_OTHER)
Admission: RE | Admit: 2011-12-04 | Discharge: 2011-12-04 | Disposition: A | Discharge status: Home / Self Care | Payer: Medicare Other | Source: Ambulatory Visit | Attending: Internal Medicine | Admitting: Internal Medicine

## 2011-12-04 ENCOUNTER — Encounter: Payer: Self-pay | Admitting: Internal Medicine

## 2011-12-04 ENCOUNTER — Ambulatory Visit (INDEPENDENT_AMBULATORY_CARE_PROVIDER_SITE_OTHER): Payer: Medicare Other | Admitting: Internal Medicine

## 2011-12-04 ENCOUNTER — Other Ambulatory Visit: Payer: Medicare Other

## 2011-12-04 VITALS — BP 126/74 | HR 85 | Ht 65.0 in | Wt 141.0 lb

## 2011-12-04 DIAGNOSIS — J42 Unspecified chronic bronchitis: Secondary | ICD-10-CM

## 2011-12-04 DIAGNOSIS — H409 Unspecified glaucoma: Secondary | ICD-10-CM

## 2011-12-04 DIAGNOSIS — D869 Sarcoidosis, unspecified: Secondary | ICD-10-CM

## 2011-12-04 NOTE — Progress Notes (Signed)
07/23/11- 71 year old female never smoker followed for sarcoid, chronic bronchitis complicated by history of right breast cancer, tachycardia, GERD, anxiety, glaucoma.. Last here- 12/29/2010. She is here today with concern about an intermittent pain at the inferior aspect of her right scapula, noticed over the past month. It is increased by stress. She also feels tender across the lower left inferior costal margin and she notices occasional heartburn. She recently restarted exercise classes at the [facility]. She denies fever productive cough sore throat nodes or exertional chest pain. She recently had cardiology followup at [facility] heart and vascular where she was told heart problems might be from sarcoid. She doesn't understand anything specific about that.  09/03/11-  71 year old female never smoker followed for sarcoid, chronic bronchitis complicated by history of right breast cancer, tachycardia, GERD, anxiety, glaucoma.. She has felt well with no acute issues. She does comment on annoying tremor of her hands. Treated by neurology with primidone which caused headache and rapid heart rate. Cold air increased her cough a little bit. She does not think she has an infection and denies fever, sweats, swollen glands, pain or purulent discharge. ACE level -07/23/11- 61 which may indicate low grade activity despite her age. CXR- 07/23/2011-patchy density right middle lobe, suspected to be sarcoid by the radiologist. We discussed the fact that this is in the field of radiation therapy for right breast cancer and may be radiation change in the underlying lung.  12/04/11-  71 year old female never smoker followed for sarcoid, chronic bronchitis complicated by history of right breast cancer, tachycardia, GERD, anxiety, glaucoma.[name]  PCP Dr Felipa Eth She keeps a mild cough which she feels in the left upper chest, productive of small amounts of white sputum with no pain fever or blood. Lites night sweats especially  if she is before bedtime.  Cough syrup causes "funny breathing" without exertional dyspnea. [facility] With exercise she gets an ache in the left upper chest which will persist for about a day. Continues Timoptic eyedrops for glaucoma.  ROS- see HPI Constitutional:   No-   weight loss, night sweats, fevers, chills, fatigue, lassitude. HEENT:   No-  headaches, difficulty swallowing, tooth/dental problems, sore throat,       No-  sneezing, itching, ear ache, nasal congestion, post nasal drip,  CV:  + chest pain, orthopnea, PND, swelling in lower extremities, anasarca, dizziness, palpitations Resp: No-   shortness of breath with exertion or at rest.             +  productive cough,  No non-productive cough,  No-  coughing up of blood.              No-   change in color of mucus.  No- wheezing.   Skin: No-   rash or lesions. GI:  No-   heartburn, indigestion, abdominal pain, nausea, vomiting, diarrhea,                 change in bowel habits, loss of appetite GU:. MS:  No-   joint pain or swelling.  No- decreased range of motion.  No- back pain. 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 No depression or anxiety.  No memory loss.  Obj General- Alert, Oriented, Affect-appropriate, Distress- none acute Skin- rash-none, lesions- none, excoriation- none Lymphadenopathy- none Head- atraumatic            Eyes- Gross vision intact, PERRLA, conjunctivae clear secretions  Ears- Hearing, canals-normal            Nose- Clear, no-Septal dev, mucus, polyps, erosion, perforation             Throat- Mallampati II , mucosa clear , drainage- none, tonsils- atrophic. Missing teeth Neck- flexible , trachea midline, no stridor , thyroid nl, carotid no bruit Chest - symmetrical excursion , unlabored           Heart/CV- RRR with no extra beats , no murmur , no gallop  , no rub, nl s1 s2                           - JVD- none ,  edema- none, stasis changes- none, varices- none           Lung- bilateral diffuse squeaks, unlabored. Work of breathing is not increased. cough- none , dullness-none, rub- none           Chest wall-  Abd-  Br/ Gen/ Rectal- Not done, not indicated Extrem- cyanosis- none, clubbing, none, atrophy- none, strength- nl Neuro- grossly intact to observation, except for resting tremor of both hands

## 2011-12-04 NOTE — Patient Instructions (Signed)
Order - lab ACE level                         dx sarcoid              CXR                Schedule PFT and 6 MWT

## 2011-12-05 LAB — ANGIOTENSIN CONVERTING ENZYME: Angiotensin-Converting Enzyme: 5 U/L — ABNORMAL LOW (ref 8–52)

## 2011-12-07 ENCOUNTER — Encounter: Payer: Self-pay | Admitting: Internal Medicine

## 2011-12-07 NOTE — Assessment & Plan Note (Signed)
We will continue to track for sarcoid activity but her age makes it unlikely.

## 2011-12-07 NOTE — Assessment & Plan Note (Signed)
We discussed Timoptic as a potentiator of bronchospasm.

## 2011-12-07 NOTE — Assessment & Plan Note (Signed)
Chronic bronchitis, partly due to her sarcoid scarring. Question if density seen in right lung reflected radiation therapy.

## 2011-12-21 ENCOUNTER — Ambulatory Visit (INDEPENDENT_AMBULATORY_CARE_PROVIDER_SITE_OTHER): Payer: Medicare Other | Admitting: Internal Medicine

## 2011-12-21 DIAGNOSIS — R0600 Dyspnea, unspecified: Secondary | ICD-10-CM

## 2011-12-21 DIAGNOSIS — D869 Sarcoidosis, unspecified: Secondary | ICD-10-CM

## 2011-12-21 DIAGNOSIS — R0609 Other forms of dyspnea: Secondary | ICD-10-CM

## 2011-12-21 LAB — PULMONARY FUNCTION TEST

## 2011-12-21 NOTE — Progress Notes (Signed)
PFT done today. 

## 2011-12-25 NOTE — Progress Notes (Signed)
Documentation for 6 minute walk test 

## 2012-01-01 ENCOUNTER — Ambulatory Visit (INDEPENDENT_AMBULATORY_CARE_PROVIDER_SITE_OTHER): Payer: Medicare Other | Admitting: Internal Medicine

## 2012-01-01 ENCOUNTER — Encounter: Payer: Self-pay | Admitting: Internal Medicine

## 2012-01-01 VITALS — BP 130/74 | HR 94 | Ht 65.0 in | Wt 140.8 lb

## 2012-01-01 DIAGNOSIS — J42 Unspecified chronic bronchitis: Secondary | ICD-10-CM

## 2012-01-01 DIAGNOSIS — D869 Sarcoidosis, unspecified: Secondary | ICD-10-CM

## 2012-01-01 MED ORDER — BECLOMETHASONE DIPROPIONATE 80 MCG/ACT IN AERS: 2.0000 | INHALATION_SPRAY | Freq: Two times a day (BID) | RESPIRATORY_TRACT | Status: DC

## 2012-01-01 MED ORDER — BENZONATATE 200 MG PO CAPS: 200.0000 mg | ORAL_CAPSULE | Freq: Three times a day (TID) | ORAL | Status: AC | PRN

## 2012-01-01 NOTE — Patient Instructions (Signed)
Script for benzonatate pearls to use for cough as needed  Sample and script for Qvar steroid inhaler - 2 puffs then rinse mouth well, twice a day, every day  I will ask your opthalmologist to consider using something for your glaucoma more selective that Timolol drops, to see if your cough gets better.

## 2012-01-01 NOTE — Progress Notes (Signed)
07/23/11- 71 year old female never smoker followed for sarcoid, chronic bronchitis complicated by history of right breast cancer, tachycardia, GERD, anxiety, glaucoma.. Last here- 12/29/2010. She is here today with concern about an intermittent pain at the inferior aspect of her right scapula, noticed over the past month. It is increased by stress. She also feels tender across the lower left inferior costal margin and she notices occasional heartburn. She recently restarted exercise classes at the [facility]. She denies fever productive cough sore throat nodes or exertional chest pain. She recently had cardiology followup at [facility] heart and vascular where she was told heart problems might be from sarcoid. She doesn't understand anything specific about that.  09/03/11-  71 year old female never smoker followed for sarcoid, chronic bronchitis complicated by history of right breast cancer, tachycardia, GERD, anxiety, glaucoma.. She has felt well with no acute issues. She does comment on annoying tremor of her hands. Treated by neurology with primidone which caused headache and rapid heart rate. Cold air increased her cough a little bit. She does not think she has an infection and denies fever, sweats, swollen glands, pain or purulent discharge. ACE level -07/23/11- 61 which may indicate low grade activity despite her age. CXR- 07/23/2011-patchy density right middle lobe, suspected to be sarcoid by the radiologist. We discussed the fact that this is in the field of radiation therapy for right breast cancer and may be radiation change in the underlying lung.  12/04/11-  71 year old female never smoker followed for sarcoid, chronic bronchitis complicated by history of right breast cancer, tachycardia, GERD, anxiety, glaucoma.[name]  PCP Dr Felipa Eth She keeps a mild cough which she feels in the left upper chest, productive of small amounts of white sputum with no pain fever or blood. Lites night sweats especially  if she is before bedtime.  Cough syrup causes "funny breathing", without exertional dyspnea. She does aerobics at the [employer]. With exercise she gets an ache in the left upper chest which will persist for about a day. Continues Timoptic eyedrops for glaucoma.  01/01/12- 71 year old female never smoker followed for sarcoid, chronic bronchitis complicated by history of right breast cancer, tachycardia, GERD, anxiety, glaucoma.[name]  PCP Dr Felipa Eth CXR 12/08/11- images reviewed with her Comparison: Chest x-ray of 07/23/2011  Findings: Vague opacity in the right mid lung and hilar and  mediastinal adenopathy is stable in this patient with a history of  sarcoidosis. No definite active process is seen. Heart size is  stable. No bony abnormality is noted.  IMPRESSION:  Stable hilar - medial and adenopathy with the opacity in the right  mid lung in this patient with sarcoidosis. No active process.  Original Report Authenticated By: Juline Patch, M.D.  Still coughs, white phlegm or dry. Primidone was changed to diastolic for tremor without increased chest tightness or wheeze. Continues timolol for glaucoma. PFT-12/21/11- normal spirometry with insignificant response to bronchodilator, normal lung volumes, diffusion at lower limits of normal. 6 minute walk test-96%, 96%, 99%, 324 m. Excellent oxygenation with no desaturation during this exercise.  ROS- see HPI Constitutional:   No-   weight loss, night sweats, fevers, chills, fatigue, lassitude. HEENT:   No-  headaches, difficulty swallowing, tooth/dental problems, sore throat,       No-  sneezing, itching, ear ache, nasal congestion, post nasal drip,  CV:  + chest pain, orthopnea, PND, swelling in lower extremities, anasarca, dizziness, palpitations Resp: No-   shortness of breath with exertion or at rest.             +  productive cough,  No non-productive cough,  No-  coughing up of blood.              No-   change in color of mucus.  No- wheezing.     Skin: No-   rash or lesions. GI:  No-   heartburn, indigestion, abdominal pain, nausea, vomiting,  GU:. MS:  No-   joint pain or swelling.  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 No depression or anxiety.  No memory loss.  Obj General- Alert, Oriented, Affect-appropriate, Distress- none acute Skin- rash-none, lesions- none, excoriation- none Lymphadenopathy- none Head- atraumatic            Eyes- Gross vision intact, PERRLA, conjunctivae clear secretions            Ears- Hearing, canals-normal            Nose- Clear, no-Septal dev, mucus, polyps, erosion, perforation             Throat- Mallampati II , mucosa clear , drainage- none, tonsils- atrophic. Missing teeth Neck- flexible , trachea midline, no stridor , thyroid nl, carotid no bruit Chest - symmetrical excursion , unlabored           Heart/CV- RRR with no extra beats , no murmur , no gallop  , no rub, nl s1 s2                           - JVD- none , edema- none, stasis changes- none, varices- none           Lung- bilateral diffuse "wet" squeaks, unlabored. Work of breathing is not increased. cough- none , dullness-none, rub- none           Chest wall-  Abd- No HSM Br/ Gen/ Rectal- Not done, not indicated Extrem- cyanosis- none, clubbing, none, atrophy- none, strength- nl Neuro- grossly intact to observation, except for resting tremor of both hands

## 2012-01-05 NOTE — Assessment & Plan Note (Addendum)
Breath sounds indicate generalized bronchitis despite her well-preserved pulmonary function scores. Glaucoma prevents trial of anticholinergic meds. I suggested she ask her ophthalmologist whether a beta blocker more selective than timolol could be tried. Adrenergic bronchodilators have made no difference. Pla Plan-trial of steroid inhaler. Benzonatate for cough.

## 2012-01-05 NOTE — Assessment & Plan Note (Addendum)
Inactive Persistent density through right mid lung is not likely to be sarcoid which is usually more symmetrical. I think it is residual from radiation therapy to right breast.

## 2012-02-09 ENCOUNTER — Ambulatory Visit (INDEPENDENT_AMBULATORY_CARE_PROVIDER_SITE_OTHER): Payer: Medicare Other | Admitting: Internal Medicine

## 2012-02-09 ENCOUNTER — Encounter: Payer: Self-pay | Admitting: Internal Medicine

## 2012-02-09 VITALS — BP 118/68 | HR 74 | Ht 65.0 in | Wt 140.8 lb

## 2012-02-09 DIAGNOSIS — D869 Sarcoidosis, unspecified: Secondary | ICD-10-CM

## 2012-02-09 DIAGNOSIS — C50919 Malignant neoplasm of unspecified site of unspecified female breast: Secondary | ICD-10-CM

## 2012-02-09 NOTE — Progress Notes (Signed)
07/23/11- 71 year old female never smoker followed for sarcoid, chronic bronchitis complicated by history of right breast cancer, tachycardia, GERD, anxiety, glaucoma.. Last here- 12/29/2010. She is here today with concern about an intermittent pain at the inferior aspect of her right scapula, noticed over the past month. It is increased by stress. She also feels tender across the lower left inferior costal margin and she notices occasional heartburn. She recently restarted exercise classes at the [facility]. She denies fever productive cough sore throat nodes or exertional chest pain. She recently had cardiology followup at [facility] heart and vascular where she was told heart problems might be from sarcoid. She doesn't understand anything specific about that.  09/03/11-  71 year old female never smoker followed for sarcoid, chronic bronchitis complicated by history of right breast cancer, tachycardia, GERD, anxiety, glaucoma.. She has felt well with no acute issues. She does comment on annoying tremor of her hands. Treated by neurology with primidone which caused headache and rapid heart rate. Cold air increased her cough a little bit. She does not think she has an infection and denies fever, sweats, swollen glands, pain or purulent discharge. ACE level -07/23/11- 61 which may indicate low grade activity despite her age. CXR- 07/23/2011-patchy density right middle lobe, suspected to be sarcoid by the radiologist. We discussed the fact that this is in the field of radiation therapy for right breast cancer and may be radiation change in the underlying lung.  12/04/11-  71 year old female never smoker followed for sarcoid, chronic bronchitis complicated by history of right breast cancer, tachycardia, GERD, anxiety, glaucoma.[name]  PCP Dr Felipa Eth She keeps a mild cough which she feels in the left upper chest, productive of small amounts of white sputum with no pain fever or blood. Lites night sweats especially  if she is before bedtime.  Cough syrup causes "funny breathing", without exertional dyspnea. She does aerobics at the [employer] With exercise she gets an ache in the left upper chest which will persist for about a day. Continues Timoptic eyedrops for glaucoma.  01/01/12- 71 year old female never smoker followed for sarcoid, chronic bronchitis complicated by history of right breast cancer, tachycardia, GERD, anxiety, glaucoma.[name]  PCP Dr Felipa Eth CXR 12/08/11- images reviewed with her Comparison: Chest x-ray of 07/23/2011  Findings: Vague opacity in the right mid lung and hilar and  mediastinal adenopathy is stable in this patient with a history of  sarcoidosis. No definite active process is seen. Heart size is  stable. No bony abnormality is noted.  IMPRESSION:  Stable hilar - medial and adenopathy with the opacity in the right  mid lung in this patient with sarcoidosis. No active process.  Original Report Authenticated By: Juline Patch, M.D.  Still coughs, white phlegm or dry. Primidone was changed to diastolic for tremor without increased chest tightness or wheeze. Continues timolol for glaucoma. PFT-12/21/11- normal spirometry with insignificant response to bronchodilator, normal lung volumes, diffusion at lower limits of normal. 6 minute walk test-96%, 96%, 99%, 324 m. Excellent oxygenation with no desaturation during this exercise.  02/09/12- 71 year old female never smoker followed for sarcoid, chronic bronchitis complicated by history of right breast cancer, tachycardia, GERD, anxiety, glaucoma.[name]  PCP Dr Felipa Eth No longer feels she needs Qvar inhaler. No longer needs benzonatate Perles. Not much cough. She did not need to ask her eye doctor about Timolol eyedrops because she was not coughing. Asks about a tender point at the left lower costal margin Last ACE level 12/04/11- 5(L)  ROS- see HPI Constitutional:  No-   weight loss, night sweats, fevers, chills, fatigue, lassitude. HEENT:   No-   headaches, difficulty swallowing, tooth/dental problems, sore throat,       No-  sneezing, itching, ear ache, nasal congestion, post nasal drip,  CV:  + chest pain, orthopnea, PND, swelling in lower extremities, anasarca, dizziness, palpitations Resp: No-   shortness of breath with exertion or at rest.             +  productive cough,  No non-productive cough,  No-  coughing up of blood.              No-   change in color of mucus.  No- wheezing.   Skin: No-   rash or lesions. GI:  No-   heartburn, indigestion, abdominal pain, nausea, vomiting,  GU:. MS:  No-   joint pain or swelling.  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 No depression or anxiety.  No memory loss.  Obj General- Alert, Oriented, Affect-appropriate, Distress- none acute Skin- rash-none, lesions- none, excoriation- none Lymphadenopathy- none Head- atraumatic            Eyes- Gross vision intact, PERRLA, conjunctivae clear secretions            Ears- Hearing, canals-normal            Nose- Clear, no-Septal dev, mucus, polyps, erosion, perforation             Throat- Mallampati II , mucosa clear , drainage- none, tonsils- atrophic. Missing teeth Neck- flexible , trachea midline, no stridor , thyroid nl, carotid no bruit Chest - symmetrical excursion , unlabored           Heart/CV- RRR with no extra beats , no murmur , no gallop  , no rub, nl s1 s2                           - JVD- none , edema- none, stasis changes- none, varices- none           Lung- bilateral diffuse "wet" squeaks still heard, unlabored. Work of breathing is not increased. cough- none , dullness-none, rub- none           Chest wall-  Abd- No HSM Br/ Gen/ Rectal- Not done, not indicated Extrem- cyanosis- none, clubbing, none, atrophy- none, strength- nl Neuro- grossly intact to observation, except for resting tremor of both hands

## 2012-02-12 NOTE — Patient Instructions (Signed)
No changes. Please call as needed.

## 2012-02-12 NOTE — Assessment & Plan Note (Signed)
Chronic bronchitic wet squeaks on chest exam most likely reflect sarcoid scarring of her airways. Nothing seems progressive. I would try Spiriva but with her glaucoma we elected not to do anything as long as she is comfortable.

## 2012-02-12 NOTE — Assessment & Plan Note (Signed)
Previous radiation of right chest wall may explain the parenchymal density deep to that area. I think post radiation changes are better explanation that localized sarcoid for her.

## 2012-02-19 DIAGNOSIS — B351 Tinea unguium: Secondary | ICD-10-CM | POA: Insufficient documentation

## 2012-03-28 ENCOUNTER — Other Ambulatory Visit: Payer: Self-pay | Admitting: Internal Medicine

## 2012-03-28 DIAGNOSIS — M79621 Pain in right upper arm: Secondary | ICD-10-CM

## 2012-03-28 DIAGNOSIS — Z853 Personal history of malignant neoplasm of breast: Secondary | ICD-10-CM

## 2012-04-01 ENCOUNTER — Ambulatory Visit
Admission: RE | Admit: 2012-04-01 | Discharge: 2012-04-01 | Disposition: A | Discharge status: Home / Self Care | Payer: Medicare Other | Source: Ambulatory Visit | Attending: Internal Medicine | Admitting: Internal Medicine

## 2012-04-01 ENCOUNTER — Other Ambulatory Visit: Payer: Self-pay | Admitting: Internal Medicine

## 2012-04-01 DIAGNOSIS — Z853 Personal history of malignant neoplasm of breast: Secondary | ICD-10-CM

## 2012-04-01 DIAGNOSIS — M79621 Pain in right upper arm: Secondary | ICD-10-CM

## 2012-04-01 DIAGNOSIS — Z1231 Encounter for screening mammogram for malignant neoplasm of breast: Secondary | ICD-10-CM

## 2012-04-22 ENCOUNTER — Ambulatory Visit
Admission: RE | Admit: 2012-04-22 | Discharge: 2012-04-22 | Disposition: A | Discharge status: Home / Self Care | Payer: Medicare Other | Source: Ambulatory Visit | Attending: Internal Medicine | Admitting: Internal Medicine

## 2012-04-22 DIAGNOSIS — Z853 Personal history of malignant neoplasm of breast: Secondary | ICD-10-CM

## 2012-04-22 DIAGNOSIS — Z1231 Encounter for screening mammogram for malignant neoplasm of breast: Secondary | ICD-10-CM

## 2012-07-11 ENCOUNTER — Ambulatory Visit: Payer: Medicare Other | Admitting: Internal Medicine

## 2012-12-16 ENCOUNTER — Telehealth: Payer: Self-pay | Admitting: Internal Medicine

## 2012-12-16 NOTE — Telephone Encounter (Signed)
Spoke with patient-seen by Dr Felipa Eth at [facility] and dx'd with PNA and was told to follow up with [facility] for this; CXR and EKG done there as well as pt given ABX and cough syrup. Pt added on Monday 12-19-12 at 11:15am slot.

## 2012-12-16 NOTE — Telephone Encounter (Signed)
Katie please advise if you can work the pt in on Monday.  Pt is requesting this date instead of appt on Tuesday.  thanks

## 2012-12-19 ENCOUNTER — Ambulatory Visit (INDEPENDENT_AMBULATORY_CARE_PROVIDER_SITE_OTHER): Payer: Medicare Other | Admitting: Internal Medicine

## 2012-12-19 ENCOUNTER — Encounter: Payer: Self-pay | Admitting: Internal Medicine

## 2012-12-19 ENCOUNTER — Ambulatory Visit (INDEPENDENT_AMBULATORY_CARE_PROVIDER_SITE_OTHER)
Admission: RE | Admit: 2012-12-19 | Discharge: 2012-12-19 | Disposition: A | Discharge status: Home / Self Care | Payer: Medicare Other | Source: Ambulatory Visit | Attending: Internal Medicine | Admitting: Internal Medicine

## 2012-12-19 VITALS — BP 138/80 | HR 114 | Ht 65.0 in | Wt 140.6 lb

## 2012-12-19 DIAGNOSIS — J42 Unspecified chronic bronchitis: Secondary | ICD-10-CM

## 2012-12-19 MED ORDER — LEVALBUTEROL HCL 0.63 MG/3ML IN NEBU
0.6300 mg | INHALATION_SOLUTION | Freq: Once | RESPIRATORY_TRACT | Status: AC
  Administered 2012-12-19: 0.63 mg via RESPIRATORY_TRACT

## 2012-12-19 MED ORDER — METHYLPREDNISOLONE ACETATE 80 MG/ML IJ SUSP
80.0000 mg | Freq: Once | INTRAMUSCULAR | Status: AC
  Administered 2012-12-19: 80 mg via INTRAMUSCULAR

## 2012-12-19 NOTE — Patient Instructions (Addendum)
Order- CXR  Dx sarcoid, recent pneumonia  Neb xop   Depo 85  Ok to continue using the Qvar inhaler for now  We will ask Dr Vicente Males office for report of the chest xray he did recently

## 2012-12-19 NOTE — Progress Notes (Signed)
07/23/11- 72 year old female never smoker followed for sarcoid, chronic bronchitis complicated by history of right breast cancer, tachycardia, GERD, anxiety, glaucoma.. Last here- 12/29/2010. She is here today with concern about an intermittent pain at the inferior aspect of her right scapula, noticed over the past month. It is increased by stress. She also feels tender across the lower left inferior costal margin and she notices occasional heartburn. She recently restarted exercise classes at the [facility]. She denies fever productive cough sore throat nodes or exertional chest pain. She recently had cardiology followup at [facility] where she was told heart problems might be from sarcoid. She doesn't understand anything specific about that.  09/03/11-  72 year old female never smoker followed for sarcoid, chronic bronchitis complicated by history of right breast cancer, tachycardia, GERD, anxiety, glaucoma.. She has felt well with no acute issues. She does comment on annoying tremor of her hands. Treated by neurology with primidone which caused headache and rapid heart rate. Cold air increased her cough a little bit. She does not think she has an infection and denies fever, sweats, swollen glands, pain or purulent discharge. ACE level -07/23/11- 61 which may indicate low grade activity despite her age. CXR- 07/23/2011-patchy density right middle lobe, suspected to be sarcoid by the radiologist. We discussed the fact that this is in the field of radiation therapy for right breast cancer and may be radiation change in the underlying lung.  12/04/11-  73 year old female never smoker followed for sarcoid, chronic bronchitis complicated by history of right breast cancer, tachycardia, GERD, anxiety, glaucoma.[name]  PCP Dr Dagmar Hait She keeps a mild cough which she feels in the left upper chest, productive of small amounts of white sputum with no pain fever or blood. Lites night sweats especially  if she is before bedtime.  Cough syrup causes "funny breathing", without exertional dyspnea. She does aerobics at the [employer]. With exercise she gets an ache in the left upper chest which will persist for about a day. Continues Timoptic eyedrops for glaucoma.  01/01/12- 72 year old female never smoker followed for sarcoid, chronic bronchitis complicated by history of right breast cancer, tachycardia, GERD, anxiety, glaucoma.[name]  PCP Dr Dagmar Hait CXR 12/08/11- images reviewed with her Comparison: Chest x-ray of 07/23/2011  Findings: Vague opacity in the right mid lung and hilar and  mediastinal adenopathy is stable in this patient with a history of  sarcoidosis. No definite active process is seen. Heart size is  stable. No bony abnormality is noted.  IMPRESSION:  Stable hilar - medial and adenopathy with the opacity in the right  mid lung in this patient with sarcoidosis. No active process.  Original Report Authenticated By: Joretta Bachelor, M.D.  Still coughs, white phlegm or dry. Primidone was changed to diastolic for tremor without increased chest tightness or wheeze. Continues timolol for glaucoma. PFT-12/21/11- normal spirometry with insignificant response to bronchodilator, normal lung volumes, diffusion at lower limits of normal. 6 minute walk test-96%, 96%, 99%, 324 m. Excellent oxygenation with no desaturation during this exercise.  02/09/12- 72 year old female never smoker followed for sarcoid, chronic bronchitis complicated by history of right breast cancer, tachycardia, GERD, anxiety, glaucoma.[name]  PCP Dr Dagmar Hait No longer feels she needs Qvar inhaler. No longer needs benzonatate Perles. Not much cough. She did not need to ask her eye doctor about Timolol eyedrops because she was not coughing. Asks about a tender point at the left lower costal margin Last ACE level 12/04/11- 5(L)  12/19/12- 72 year old female never smoker followed  for sarcoid, chronic bronchitis complicated by history of right  breast cancer, tachycardia, GERD, anxiety, glaucoma.[name]  PCP Dr Felipa Eth ACUTE VISIT: recently dx'd by  Dr. Felipa Eth with PNA; wanted to be rechecked by CY and at the request of Dr. Felipa Eth. His CXR report not yet available.  She had incr cough with white sputum, no fever starting 2 weeks ago. Finished 7 days of levaquin 5 days ago. Now little dry cough. Using Qvar- had been wheezing. Feels washed out. Bilateral lower rib ache from cough. CXR 12/08/11 IMPRESSION:  Stable hilar - medial and adenopathy with the opacity in the right  mid lung in this patient with sarcoidosis. No active process.  Original Report Authenticated By: Juline Patch, M.D.  ROS- see HPI Constitutional:   No-   weight loss, night sweats, fevers, chills,+ fatigue, lassitude. HEENT:   No-  headaches, difficulty swallowing, tooth/dental problems, sore throat,       No-  sneezing, itching, ear ache, nasal congestion, post nasal drip,  CV:  + chest pain, orthopnea, PND, swelling in lower extremities, anasarca, dizziness, palpitations Resp: No-   shortness of breath with exertion or at rest.             +  productive cough,  No non-productive cough,  No-  coughing up of blood.              No-   change in color of mucus.  No- wheezing.   Skin: No-   rash or lesions. GI:  No-   heartburn, indigestion, abdominal pain, nausea, vomiting,  GU:. MS:  No-   joint pain or swelling.  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 No depression or anxiety.  No memory loss.  Obj General- Alert, Oriented, Affect-appropriate, Distress- none acute Skin- rash-none, lesions- none, excoriation- none Lymphadenopathy- none Head- atraumatic            Eyes- Gross vision intact, PERRLA, conjunctivae clear secretions            Ears- Hearing, canals-normal            Nose- Clear, no-Septal dev, mucus, polyps, erosion, perforation             Throat- Mallampati II , mucosa clear , drainage- none, tonsils- atrophic.  Missing teeth Neck- flexible , trachea midline, no stridor , thyroid nl, carotid no bruit Chest - symmetrical excursion , unlabored           Heart/CV- RRR with no extra beats , no murmur , no gallop  , no rub, nl s1 s2                           - JVD- none , edema- none, stasis changes- none, varices- none           Lung- + bilateral coarse, unlabored wheeze,  Work of breathing is not increased. cough- none , dullness-none, rub- none           Chest wall- Scar R breast Abd- No HSM Br/ Gen/ Rectal- Not done, not indicated Extrem- cyanosis- none, clubbing, none, atrophy- none, strength- nl Neuro- Tremor getting worse.

## 2012-12-20 ENCOUNTER — Ambulatory Visit: Payer: Medicare Other | Admitting: Internal Medicine

## 2012-12-20 NOTE — Assessment & Plan Note (Signed)
Coarse breath sounds may be from recent infection/ bronchitis or pneumonia. Underlying sarcoid scarring.  Plan- neb xop, depo

## 2012-12-20 NOTE — Assessment & Plan Note (Signed)
Acute exacerbation. 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 We will get CXR now that can show status of pneumonia and radiation related density by conmparison with prior studies in our system.

## 2012-12-21 ENCOUNTER — Telehealth: Payer: Self-pay | Admitting: Internal Medicine

## 2012-12-21 NOTE — Progress Notes (Signed)
Quick Note:  Spoke with pt and notified of results per Dr. Young. Pt verbalized understanding and denied any questions.  ______ 

## 2012-12-21 NOTE — Telephone Encounter (Signed)
Notes Recorded by Waymon Budge, MD on 12/19/2012 at 8:04 PM No active process. Stable post radiation changes in the right lung, but no signof pneumonia now  Spoke with pt and notified of results per Dr. Sherene Sires. Pt verbalized understanding and denied any questions

## 2013-01-09 ENCOUNTER — Telehealth: Payer: Self-pay | Admitting: Internal Medicine

## 2013-01-09 MED ORDER — PREDNISONE 20 MG PO TABS: 20.0000 mg | ORAL_TABLET | Freq: Every day | ORAL | Status: DC

## 2013-01-09 NOTE — Telephone Encounter (Signed)
Per CY: try changing claritin to allegra 180mg  (or fexofenadine) and prednisone 20mg  #5, 1 daily x 5 days then stop.  Called spoke with patient, advised of CY's recs as stated above.  Pt verbalized her understanding and denied any questions.  Pt was advised to call if she develops discolored mucus.  Allegra is otc and prednisone has been sent to verified pharmacy.

## 2013-01-09 NOTE — Telephone Encounter (Signed)
Called, spoke with pt.  C/o watery eyes, runny nose with clear drainage, sneezing, and nonprod cough.  Symptoms started on Friday.  Denies PND, increased SOB, wheezing, or chest tightness.  Has taken claritin with not much relief and used hycodan with some relief.  Requesting further recs.  Dr. Maple Hudson, pls advise.  Thank you.  Last OV with CDY: 12/19/12; asked to f/u in 3 months Pending OV with [facility] 03/21/13  [facility] [address]  Allergies verified with pt: Allergies  Allergen Reactions  . Dextromethorphan Polistirex Er Other (See Comments)    SOB  . Primidone     Severe Headaches  . Albuterol     REACTION: tachycardia  . Clonazepam Hives  . Loratadine

## 2013-03-02 ENCOUNTER — Encounter: Payer: Self-pay | Admitting: Cardiovascular Disease

## 2013-03-02 ENCOUNTER — Ambulatory Visit (INDEPENDENT_AMBULATORY_CARE_PROVIDER_SITE_OTHER): Payer: Medicare Other | Admitting: Cardiovascular Disease

## 2013-03-02 VITALS — BP 130/84 | HR 94 | Resp 16 | Ht 63.0 in | Wt 137.8 lb

## 2013-03-02 DIAGNOSIS — F411 Generalized anxiety disorder: Secondary | ICD-10-CM

## 2013-03-02 DIAGNOSIS — I472 Ventricular tachycardia, unspecified: Secondary | ICD-10-CM

## 2013-03-02 DIAGNOSIS — I10 Essential (primary) hypertension: Secondary | ICD-10-CM

## 2013-03-02 DIAGNOSIS — G252 Other specified forms of tremor: Secondary | ICD-10-CM

## 2013-03-02 DIAGNOSIS — G250 Essential tremor: Secondary | ICD-10-CM

## 2013-03-02 DIAGNOSIS — M79604 Pain in right leg: Secondary | ICD-10-CM

## 2013-03-02 DIAGNOSIS — D869 Sarcoidosis, unspecified: Secondary | ICD-10-CM

## 2013-03-02 DIAGNOSIS — M79609 Pain in unspecified limb: Secondary | ICD-10-CM

## 2013-03-02 MED ORDER — TRAMADOL-ACETAMINOPHEN 37.5-325 MG PO TABS: 1.0000 | ORAL_TABLET | Freq: Four times a day (QID) | ORAL | Status: DC | PRN

## 2013-03-02 MED ORDER — LOSARTAN POTASSIUM 50 MG PO TABS: 50.0000 mg | ORAL_TABLET | Freq: Every day | ORAL | Status: DC

## 2013-03-02 NOTE — Assessment & Plan Note (Signed)
Dyspnea seems to be at baseline

## 2013-03-02 NOTE — Assessment & Plan Note (Signed)
No improvement since she was last seen. She is reluctant to start medical therapy

## 2013-03-02 NOTE — Assessment & Plan Note (Signed)
At her previous appointment we switched from Diovan to losartan for financial reasons. She feels dizzy on the current dose of losartan and typical blood pressure that she has seen when checking it at the drug store has been around 115/70. Have asked her to reduce the dose of losartan to 50 mg once daily

## 2013-03-02 NOTE — Assessment & Plan Note (Signed)
The symptoms do not suggest intermittent claudication and she has excellent pedal pulses. Will evaluate for deep venous thrombosis of the left lower extremity. I prescribed her Ultracet for symptom relief

## 2013-03-02 NOTE — Patient Instructions (Signed)
Return for ultrasound of right leg to evaluate for venous clot. No change in medications.

## 2013-03-02 NOTE — Assessment & Plan Note (Signed)
No clinical episodes of syncope or palpitations since her last appointment
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
## 2013-03-03 ENCOUNTER — Ambulatory Visit (HOSPITAL_COMMUNITY)
Admission: RE | Admit: 2013-03-03 | Discharge: 2013-03-03 | Disposition: A | Discharge status: Home / Self Care | Payer: Medicare Other | Source: Ambulatory Visit | Attending: Cardiovascular Disease | Admitting: Cardiovascular Disease

## 2013-03-03 DIAGNOSIS — M79609 Pain in unspecified limb: Secondary | ICD-10-CM | POA: Insufficient documentation

## 2013-03-03 DIAGNOSIS — M79604 Pain in right leg: Secondary | ICD-10-CM

## 2013-03-03 NOTE — Progress Notes (Signed)
Lower extremity venous duplex limited complete. GMG

## 2013-03-21 ENCOUNTER — Encounter: Payer: Self-pay | Admitting: Internal Medicine

## 2013-03-21 ENCOUNTER — Ambulatory Visit (INDEPENDENT_AMBULATORY_CARE_PROVIDER_SITE_OTHER): Payer: Medicare Other | Admitting: Internal Medicine

## 2013-03-21 VITALS — BP 130/80 | HR 117 | Ht 65.0 in | Wt 141.6 lb

## 2013-03-21 DIAGNOSIS — D869 Sarcoidosis, unspecified: Secondary | ICD-10-CM

## 2013-03-21 DIAGNOSIS — J42 Unspecified chronic bronchitis: Secondary | ICD-10-CM

## 2013-03-21 DIAGNOSIS — F411 Generalized anxiety disorder: Secondary | ICD-10-CM

## 2013-03-21 MED ORDER — LORAZEPAM 0.5 MG PO TABS: ORAL_TABLET | ORAL | Status: DC

## 2013-03-21 NOTE — Patient Instructions (Addendum)
Script for lorazepam/ Ativan for occasional use for nerves as needed, instead of valium/diazepam  Please call as needed

## 2013-03-21 NOTE — Progress Notes (Signed)
07/23/11- 72 year old female never smoker followed for sarcoid, chronic bronchitis complicated by history of right breast cancer, tachycardia, GERD, anxiety, glaucoma.. Last here- 12/29/2010. She is here today with concern about an intermittent pain at the inferior aspect of her right scapula, noticed over the past month. It is increased by stress. She also feels tender across the lower left inferior costal margin and she notices occasional heartburn. She recently restarted exercise classes at the [facility]. She denies fever productive cough sore throat nodes or exertional chest pain. She recently had cardiology followup at [facility] where she was told heart problems might be from sarcoid. She doesn't understand anything specific about that.  09/03/11-  72 year old female never smoker followed for sarcoid, chronic bronchitis complicated by history of right breast cancer, tachycardia, GERD, anxiety, glaucoma.. She has felt well with no acute issues. She does comment on annoying tremor of her hands. Treated by neurology with primidone which caused headache and rapid heart rate. Cold air increased her cough a little bit. She does not think she has an infection and denies fever, sweats, swollen glands, pain or purulent discharge. ACE level -07/23/11- 61 which may indicate low grade activity despite her age. CXR- 07/23/2011-patchy density right middle lobe, suspected to be sarcoid by the radiologist. We discussed the fact that this is in the field of radiation therapy for right breast cancer and may be radiation change in the underlying lung.  12/04/11-  73 year old female never smoker followed for sarcoid, chronic bronchitis complicated by history of right breast cancer, tachycardia, GERD, anxiety, glaucoma.[name]  PCP Dr Dagmar Hait She keeps a mild cough which she feels in the left upper chest, productive of small amounts of white sputum with no pain fever or blood. Lites night sweats especially  if she is before bedtime.  Cough syrup causes "funny breathing", without exertional dyspnea. She does aerobics at the [employer]. With exercise she gets an ache in the left upper chest which will persist for about a day. Continues Timoptic eyedrops for glaucoma.  01/01/12- 72 year old female never smoker followed for sarcoid, chronic bronchitis complicated by history of right breast cancer, tachycardia, GERD, anxiety, glaucoma.[name]  PCP Dr Dagmar Hait CXR 12/08/11- images reviewed with her Comparison: Chest x-ray of 07/23/2011  Findings: Vague opacity in the right mid lung and hilar and  mediastinal adenopathy is stable in this patient with a history of  sarcoidosis. No definite active process is seen. Heart size is  stable. No bony abnormality is noted.  IMPRESSION:  Stable hilar - medial and adenopathy with the opacity in the right  mid lung in this patient with sarcoidosis. No active process.  Original Report Authenticated By: Joretta Bachelor, M.D.  Still coughs, white phlegm or dry. Primidone was changed to diastolic for tremor without increased chest tightness or wheeze. Continues timolol for glaucoma. PFT-12/21/11- normal spirometry with insignificant response to bronchodilator, normal lung volumes, diffusion at lower limits of normal. 6 minute walk test-96%, 96%, 99%, 324 m. Excellent oxygenation with no desaturation during this exercise.  02/09/12- 72 year old female never smoker followed for sarcoid, chronic bronchitis complicated by history of right breast cancer, tachycardia, GERD, anxiety, glaucoma.[name]  PCP Dr Dagmar Hait No longer feels she needs Qvar inhaler. No longer needs benzonatate Perles. Not much cough. She did not need to ask her eye doctor about Timolol eyedrops because she was not coughing. Asks about a tender point at the left lower costal margin Last ACE level 12/04/11- 5(L)  12/19/12- 72 year old female never smoker followed  for sarcoid, chronic bronchitis complicated by history of right  breast cancer, tachycardia, GERD, anxiety, glaucoma.[name]  PCP Dr Felipa Eth ACUTE VISIT: recently dx'd by  Dr. Felipa Eth with PNA; wanted to be rechecked by CY and at the request of Dr. Felipa Eth. His CXR report not yet available.  She had incr cough with white sputum, no fever starting 2 weeks ago. Finished 7 days of levaquin 5 days ago. Now little dry cough. Using Qvar- had been wheezing. Feels washed out. Bilateral lower rib ache from cough. CXR 12/08/11 IMPRESSION:  Stable hilar - medial and adenopathy with the opacity in the right  mid lung in this patient with sarcoidosis. No active process.  Original Report Authenticated By: Juline Patch, M.D.  03/21/13- 72 year old female never smoker followed for sarcoid, chronic bronchitis complicated by history of right breast cancer/ XRT, tachycardia, GERD, anxiety, glaucoma.[name]  PCP Dr Felipa Eth Husband just died of lung cancer She had Valium but dislikes the rebound and asks if I could give her something for anxiety for a few days. Breathing has been stable with some cough, no sputum, no fever. CXR 12/21/12 IMPRESSION:  No active disease. Stable hilar adenopathy. Again noted post  radiation changes in right upper lobe.  Original Report Authenticated By: Natasha Mead, M.D.   ROS- see HPI Constitutional:   No-   weight loss, night sweats, fevers, chills,+ fatigue, lassitude. HEENT:   No-  headaches, difficulty swallowing, tooth/dental problems, sore throat,       No-  sneezing, itching, ear ache, nasal congestion, post nasal drip,  CV:  No- chest pain, orthopnea, PND, swelling in lower extremities, anasarca, dizziness, palpitations Resp: No-   shortness of breath with exertion or at rest.             No-  productive cough,  + non-productive cough,  No-  coughing up of blood.              No-   change in color of mucus.  No- wheezing.   Skin: No-   rash or lesions. GI:  No-   heartburn, indigestion, abdominal pain, nausea, vomiting,  GU:. MS:  No-   joint pain or swelling.   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 No depression or anxiety.  No memory loss.  Obj General- Alert, Oriented, Affect-appropriate, Distress- none acute Skin- rash-none, lesions- none, excoriation- none Lymphadenopathy- none Head- atraumatic            Eyes- Gross vision intact, PERRLA, conjunctivae clear secretions            Ears- Hearing, canals-normal            Nose- Clear, no-Septal dev, mucus, polyps, erosion, perforation             Throat- Mallampati II , mucosa clear , drainage- none, tonsils- atrophic. Missing teeth Neck- flexible , trachea midline, no stridor , thyroid nl, carotid no bruit Chest - symmetrical excursion , unlabored           Heart/CV- RRR with no extra beats , no murmur , no gallop  , no rub, nl s1 s2                           - JVD- none , edema- none, stasis changes- none, varices- none           Lung- + bilateral unlabored squeaks,  Work of breathing  is not increased. cough- none , dullness-none, rub- none           Chest wall- Scar R breast Abd- No HSM Br/ Gen/ Rectal- Not done, not indicated Extrem- cyanosis- none, clubbing, none, atrophy- none, strength- nl Neuro- Tremor getting worse.

## 2013-03-27 ENCOUNTER — Encounter: Payer: Self-pay | Admitting: Cardiovascular Disease

## 2013-03-31 ENCOUNTER — Other Ambulatory Visit: Payer: Self-pay

## 2013-03-31 DIAGNOSIS — Z1231 Encounter for screening mammogram for malignant neoplasm of breast: Secondary | ICD-10-CM

## 2013-04-02 NOTE — Assessment & Plan Note (Signed)
Residual hilar adenopathy and scarring ,but no active process on chest x-ray Clinically in long-term remission.

## 2013-04-02 NOTE — Assessment & Plan Note (Signed)
She has kept some cough and noisy breath sounds, but nothing new

## 2013-04-02 NOTE — Assessment & Plan Note (Signed)
Situational exacerbation due to husband's death. Plan-try Ativan instead of Valium, directed at both anxiety and tremor

## 2013-05-05 ENCOUNTER — Ambulatory Visit: Payer: Medicare Other

## 2013-05-15 ENCOUNTER — Telehealth: Payer: Self-pay | Admitting: Cardiovascular Disease

## 2013-05-15 NOTE — Telephone Encounter (Signed)
Returned call and informed pt per instructions by MD/PA.  Pt verbalized understanding and agreed w/ plan.  

## 2013-05-15 NOTE — Telephone Encounter (Signed)
Agree that symptoms sound more likely to be musculoskeletal. Note very recent normal nuclear perfusion study.

## 2013-05-15 NOTE — Telephone Encounter (Signed)
Returned call.  Pt c/o pain under breasts near ribcage and in both arms x 3 days.  Stated she has been doing exercises and didn't know if that has anything to do with it.  Stated she has been working out for a couple of weeks, about three times a week.  Pt described pain as aching.  Stated she had pain in her R arm on yesterday and that is the side she had her breast surgery on.  Denied SOB w/ CP.  C/o tingling in hands and is constant.  Denied any change in tingling w/ CP.  Rated CP 5/10 now on pain scale.  Denied having NTG.  Denied abd or back pain.  Stated pain is just on both sides under her breasts by the ribcage.  Pt informed symptoms sound like they may be more musculoskeletal and RN will discuss with MD/PA/NP for further instructions.  Pt verbalized understanding and agreed w/ plan.

## 2013-05-15 NOTE — Telephone Encounter (Signed)
Patient states pain in chest and ribcage since Saturday.  Both arms hurt. No SOB. Wants to talk to the nurse.

## 2013-05-18 ENCOUNTER — Ambulatory Visit
Admission: RE | Admit: 2013-05-18 | Discharge: 2013-05-18 | Disposition: A | Discharge status: Home / Self Care | Payer: Medicare Other | Source: Ambulatory Visit

## 2013-05-18 DIAGNOSIS — Z1231 Encounter for screening mammogram for malignant neoplasm of breast: Secondary | ICD-10-CM

## 2013-06-13 ENCOUNTER — Telehealth: Payer: Self-pay | Admitting: Internal Medicine

## 2013-06-13 NOTE — Telephone Encounter (Signed)
I spoke with the pt and she is c/o having a rash and itching on her arms and under her eyes. It is a small red rash that itches. Pt denies any swelling. 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 Carron Curie, CMA

## 2013-06-21 ENCOUNTER — Encounter: Payer: Self-pay | Admitting: Internal Medicine

## 2013-06-21 ENCOUNTER — Ambulatory Visit (INDEPENDENT_AMBULATORY_CARE_PROVIDER_SITE_OTHER): Payer: Medicare Other | Admitting: Internal Medicine

## 2013-06-21 VITALS — BP 124/70 | HR 98 | Ht 65.5 in | Wt 144.2 lb

## 2013-06-21 DIAGNOSIS — G250 Essential tremor: Secondary | ICD-10-CM

## 2013-06-21 DIAGNOSIS — L299 Pruritus, unspecified: Secondary | ICD-10-CM

## 2013-06-21 DIAGNOSIS — D869 Sarcoidosis, unspecified: Secondary | ICD-10-CM

## 2013-06-21 DIAGNOSIS — J42 Unspecified chronic bronchitis: Secondary | ICD-10-CM

## 2013-06-21 MED ORDER — LORAZEPAM 0.5 MG PO TABS: ORAL_TABLET | ORAL | Status: DC

## 2013-06-21 NOTE — Patient Instructions (Addendum)
Ok to try off Qvar, to see how much difference it makes. If it helps, then you can continue it.  Any dry skin lotion at the drug store may help the itching.  Given short term script for loraxzepam to help with nervousness and tremor until you can see the neurologist. They and Dr Felipa Eth should be the ones to treat those problems.

## 2013-06-21 NOTE — Progress Notes (Signed)
07/23/11- 72 year old female never smoker followed for sarcoid, chronic bronchitis complicated by history of right breast cancer, tachycardia, GERD, anxiety, glaucoma.. Last here- 12/29/2010. She is here today with concern about an intermittent pain at the inferior aspect of her right scapula, noticed over the past month. It is increased by stress. She also feels tender across the lower left inferior costal margin and she notices occasional heartburn. She recently restarted exercise classes at the [facility]. She denies fever productive cough sore throat nodes or exertional chest pain. She recently had cardiology followup at [facility] where she was told heart problems might be from sarcoid. She doesn't understand anything specific about that.  09/03/11-  72 year old female never smoker followed for sarcoid, chronic bronchitis complicated by history of right breast cancer, tachycardia, GERD, anxiety, glaucoma.. She has felt well with no acute issues. She does comment on annoying tremor of her hands. Treated by neurology with primidone which caused headache and rapid heart rate. Cold air increased her cough a little bit. She does not think she has an infection and denies fever, sweats, swollen glands, pain or purulent discharge. ACE level -07/23/11- 61 which may indicate low grade activity despite her age. CXR- 07/23/2011-patchy density right middle lobe, suspected to be sarcoid by the radiologist. We discussed the fact that this is in the field of radiation therapy for right breast cancer and may be radiation change in the underlying lung.  12/04/11-  73 year old female never smoker followed for sarcoid, chronic bronchitis complicated by history of right breast cancer, tachycardia, GERD, anxiety, glaucoma.[name]  PCP Dr Dagmar Hait She keeps a mild cough which she feels in the left upper chest, productive of small amounts of white sputum with no pain fever or blood. Lites night sweats especially  if she is before bedtime.  Cough syrup causes "funny breathing", without exertional dyspnea. She does aerobics at the [employer]. With exercise she gets an ache in the left upper chest which will persist for about a day. Continues Timoptic eyedrops for glaucoma.  01/01/12- 72 year old female never smoker followed for sarcoid, chronic bronchitis complicated by history of right breast cancer, tachycardia, GERD, anxiety, glaucoma.[name]  PCP Dr Dagmar Hait CXR 12/08/11- images reviewed with her Comparison: Chest x-ray of 07/23/2011  Findings: Vague opacity in the right mid lung and hilar and  mediastinal adenopathy is stable in this patient with a history of  sarcoidosis. No definite active process is seen. Heart size is  stable. No bony abnormality is noted.  IMPRESSION:  Stable hilar - medial and adenopathy with the opacity in the right  mid lung in this patient with sarcoidosis. No active process.  Original Report Authenticated By: Joretta Bachelor, M.D.  Still coughs, white phlegm or dry. Primidone was changed to diastolic for tremor without increased chest tightness or wheeze. Continues timolol for glaucoma. PFT-12/21/11- normal spirometry with insignificant response to bronchodilator, normal lung volumes, diffusion at lower limits of normal. 6 minute walk test-96%, 96%, 99%, 324 m. Excellent oxygenation with no desaturation during this exercise.  02/09/12- 72 year old female never smoker followed for sarcoid, chronic bronchitis complicated by history of right breast cancer, tachycardia, GERD, anxiety, glaucoma.[name]  PCP Dr Dagmar Hait No longer feels she needs Qvar inhaler. No longer needs benzonatate Perles. Not much cough. She did not need to ask her eye doctor about Timolol eyedrops because she was not coughing. Asks about a tender point at the left lower costal margin Last ACE level 12/04/11- 5(L)  12/19/12- 72 year old female never smoker followed  for sarcoid, chronic bronchitis complicated by history of right  breast cancer, tachycardia, GERD, anxiety, glaucoma.[name]  PCP Dr Felipa Eth ACUTE VISIT: recently dx'd by  Dr. Felipa Eth with PNA; wanted to be rechecked by CY and at the request of Dr. Felipa Eth. His CXR report not yet available.  She had incr cough with white sputum, no fever starting 2 weeks ago. Finished 7 days of levaquin 5 days ago. Now little dry cough. Using Qvar- had been wheezing. Feels washed out. Bilateral lower rib ache from cough. CXR 12/08/11 IMPRESSION:  Stable hilar - medial and adenopathy with the opacity in the right  mid lung in this patient with sarcoidosis. No active process.  Original Report Authenticated By: Juline Patch, M.D.  03/21/13- 72 year old female never smoker followed for sarcoid, chronic bronchitis complicated by history of right breast cancer/ XRT, tachycardia, GERD, anxiety, glaucoma.[name]  PCP Dr Felipa Eth Husband just died of lung cancer She had Valium but dislikes the rebound and asks if I could give her something for anxiety for a few days. Breathing has been stable with some cough, no sputum, no fever. CXR 12/21/12 IMPRESSION:  No active disease. Stable hilar adenopathy. Again noted post  radiation changes in right upper lobe.  Original Report Authenticated By: Natasha Mead, M.D.  06/21/13- 72 year old female never smoker followed for sarcoid, chronic bronchitis complicated by history of right breast cancer/ XRT, tachycardia, GERD, anxiety, glaucoma.[name]  PCP Dr Felipa Eth  FOLLOWS FOR:  Dry cough. Denies wheezing or SOB otherwise unchanged since last OV Denies change in her dry cough without significant shortness of breath.  Valium did not help her tremor-has a neurology appointment. New complaint of diffuse itching especially upper chest and hands without rash. Tried Aquaphor. Wants to delay flu vaccine.  ROS- see HPI Constitutional:   No-   weight loss, night sweats, fevers, chills,+ fatigue, lassitude. HEENT:   No-  headaches, difficulty swallowing, tooth/dental problems, sore throat,        No-  sneezing, itching, ear ache, nasal congestion, post nasal drip,  CV:  No- chest pain, orthopnea, PND, swelling in lower extremities, anasarca, dizziness, palpitations Resp: No-   shortness of breath with exertion or at rest.             No-  productive cough,  + non-productive cough,  No-  coughing up of blood.              No-   change in color of mucus.  No- wheezing.   Skin: +pruritus without rash GI:  No-   heartburn, indigestion, abdominal pain, nausea, vomiting,  GU:. MS:  No-   joint pain or swelling.  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 No depression or anxiety.  No memory loss.  Obj General- Alert, Oriented, Affect-appropriate, Distress- none acute Skin- rash-none, lesions- none, excoriation- none Lymphadenopathy- none Head- atraumatic            Eyes- Gross vision intact, PERRLA, conjunctivae clear secretions            Ears- Hearing, canals-normal            Nose- Clear, no-Septal dev, mucus, polyps, erosion, perforation             Throat- Mallampati II , mucosa clear , drainage- none, tonsils- atrophic. Missing teeth Neck- flexible , trachea midline, no stridor , thyroid nl, carotid no bruit Chest - symmetrical excursion , unlabored  Heart/CV- RRR with no extra beats , no murmur , no gallop  , no rub, nl s1 s2                           - JVD- none , edema- none, stasis changes- none, varices- none           Lung- + bilateral unlabored squeaks,  Work of breathing is not increased. cough- none ,                     dullness-none, rub- none           Chest wall- treatment Scar R breast Abd- No HSM Br/ Gen/ Rectal- Not done, not indicated Extrem- cyanosis- none, clubbing, none, atrophy- none, strength- nl Neuro- Tremor getting worse.

## 2013-06-28 ENCOUNTER — Telehealth: Payer: Self-pay | Admitting: *Deleted

## 2013-06-28 DIAGNOSIS — L299 Pruritus, unspecified: Secondary | ICD-10-CM | POA: Insufficient documentation

## 2013-06-28 NOTE — Telephone Encounter (Signed)
Left pt a reminder message about appt on 06-29-13.

## 2013-06-28 NOTE — Assessment & Plan Note (Signed)
We have felt most of the parenchymal density in right lung was secondary to her cancer therapy. Any sarcoid is likely in long-term remission

## 2013-06-28 NOTE — Assessment & Plan Note (Signed)
Mild dry cough has been fairly well controlled. Plan-she is going to try off of Qvar, unsure if it is doing anything

## 2013-06-28 NOTE — Assessment & Plan Note (Signed)
This may just be dry skin with localized areas of itching. Plan-skin moisturizers as discussed

## 2013-06-28 NOTE — Assessment & Plan Note (Signed)
Plan- short-term lorazepam prescription just as a bridge until her neurology appointment

## 2013-06-29 ENCOUNTER — Ambulatory Visit (INDEPENDENT_AMBULATORY_CARE_PROVIDER_SITE_OTHER): Payer: Medicare Other | Admitting: Nurse Practitioner

## 2013-06-29 ENCOUNTER — Encounter: Payer: Self-pay | Admitting: Nurse Practitioner

## 2013-06-29 VITALS — BP 142/79 | HR 99 | Ht 63.5 in | Wt 141.0 lb

## 2013-06-29 DIAGNOSIS — I10 Essential (primary) hypertension: Secondary | ICD-10-CM

## 2013-06-29 DIAGNOSIS — F411 Generalized anxiety disorder: Secondary | ICD-10-CM

## 2013-06-29 DIAGNOSIS — G250 Essential tremor: Secondary | ICD-10-CM

## 2013-06-29 NOTE — Patient Instructions (Addendum)
Pt to continue Antivan as needed for tremor, she has only taken 1 dose Try using weighted utensils when eating F/U in 3 months

## 2013-06-29 NOTE — Progress Notes (Signed)
[facility]  PATIENT: Danielle Peters DOB: May 08, 1941   REASON FOR VISIT: Essential tremor   HISTORY OF PRESENT ILLNESS:Danielle Peters, 72 year old black female returns for followup. She has a history of benign essential tremor was initially evaluated by Dr. Terrace Arabia in 2011.  She has tried primadone but only intermittently, at most was taking half tablets of 50 mg once a day, without significant improvement, but with complaints of headache and therefore the medication was stopped .Her tremor causes  social embarrassment, and interferes with her ability to  perform  ADLs particularly feeding herself. Previous medications  include diazepam, she is no longer taking it, she was taking  Zoloft for her depression and anxiety. She has also been on clonazepam and got a rash. She is driving without problems. She denied loss of smell, REM sleep disorder, has anxiety symptoms, denied family history of tremor. No gait instability. She was recently placed on Ativan by her pulmonologist for her tremor, she is only taken one dose since it was ordered. She goes to the [facility] 3 times a week for an aerobics class.   History: Patient is a 72 year old right-handed [place] American female, she is accompanied by her daughter at today's clinical visit. 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 She presenting with few years history of bilateral hands tremor, getting worse when she stretched out her hands, or using utensils, she has describe difficulty feeding herself, cause a lot of social embarrasement, she denies significant gait difficulty, but described one episode of falling in October 2011, without clear triggering event, she denied loss of consciousness, but stumbled  and fell, she complains bilateral knee pain, which has posed limitation for ambulating, She denies incontinence, she denied bilateral lower exremity paresthesia weakness, but does complain fingertips numbness tingling. She denied loss of smell, REM sleep disorder, has anxiety symptoms, denied family history of tremor   REVIEW OF SYSTEMS: Full 14 system review of systems performed and notable only for:  Constitutional: N/A  Cardiovascular: N/A  Ear/Nose/Throat: Hearing loss left ear  Skin: N/A  Eyes: N/A  Respiratory: N/A  Gastroitestinal: N/A  Hematology/Lymphatic: N/A  Endocrine: N/A Musculoskeletal:N/A  Allergy/Immunology: N/A  Neurological: Tremor Psychiatric: N/A   ALLERGIES: Allergies  Allergen Reactions  . Dextromethorphan Polistirex Er Other (See Comments)    SOB  . Primidone     Severe Headaches  . Albuterol     REACTION: tachycardia  . Asa [Aspirin]     Adult dose  . Clonazepam Hives  . Loratadine   . Simvastatin     Myalgias     HOME MEDICATIONS: Outpatient Prescriptions Prior to Visit  Medication Sig Dispense Refill  . aspirin 81 MG tablet Take 81 mg by mouth daily.        . diazepam (VALIUM) 5 MG tablet Take 0.5 tablets by mouth every 8 (eight) hours as needed.      . hydrochlorothiazide 25 MG tablet Take 25 mg by mouth daily.       [name] LORazepam (ATIVAN) 0.5 MG tablet 1/2-1 tab every 8 hours if needed for nerves and tremor  20 tablet  0  . losartan (COZAAR) 50 MG tablet Take 1 tablet (50 mg total) by mouth daily.  30 tablet  11  . loteprednol (LOTEMAX) 0.5 % ophthalmic suspension As directed       .  meclizine (ANTIVERT) 25 MG tablet 1 tablet daily.      [name] omeprazole (PRILOSEC) 20 MG capsule Take 20 mg by mouth 2 (two) times daily.       . beclomethasone (QVAR) 80 MCG/ACT inhaler Inhale 2 puffs into the lungs 2 (two) times daily. Rinse mouth well  1 Inhaler  2   No facility-administered medications prior to visit.    PAST MEDICAL HISTORY: Past Medical  History  Diagnosis Date  . Cancer     breast ca  . Hypertension   . Pulmonary sarcoidosis   . Anxiety   . Esophageal reflux   . Anemia   . 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 Dyslipidemia     myaligia with statins  . Tremor     upper extremities  . Chest pain     echo 01/08/10- nl lv; mild aortic sclerosis; myoview 01/08/10- no ischemia    PAST SURGICAL HISTORY: Past Surgical History  Procedure Laterality Date  . Left breast lumpectomy,chemo  2004    xrt  . Skin biopsy      FAMILY HISTORY: History reviewed. No pertinent family history.  SOCIAL HISTORY: History   Social History  . Marital Status: Divorced    Spouse Name: N/A    Number of Children: 2  . Years of Education: 12   Occupational History  . school Engineer, petroleum    Social History Main Topics  . Smoking status: Never Smoker   . Smokeless tobacco: Never Used  . Alcohol Use: No  . Drug Use: No  . Sexual Activity: Not on file   Other Topics Concern  . Not on file   Social History Narrative   Patient lives at home alone. Patient is retired.Pateint has high school education. And went to [employer].   Right handed.   Caffeine- one cup daily.     PHYSICAL EXAM  Filed Vitals:   06/29/13 0856  BP: 142/79  Pulse: 99  Height: 5' 3.5" (1.613 m)  Weight: 141 lb (63.957 kg)   Body mass index is 24.58 kg/(m^2).  Generalized: Well developed, in no acute distress  Head: normocephalic and atraumatic,. Oropharynx benign  Neck: Supple, no carotid bruits  Cardiac: Regular rate rhythm, no murmur  Musculoskeletal: No deformity   Neurological examination   Mentation: Alert oriented to time, place, history taking. Follows all commands speech and language fluent  Cranial nerve II-XII: Pupils were equal round reactive to light extraocular movements were full, visual field were full on confrontational test. Facial sensation and  strength were normal. hearing loss left ear. Uvula tongue midline. head turning and shoulder shrug and were normal and symmetric.Tongue protrusion into cheek strength was normal. Motor: normal bulk and tone, full strength in the BUE, BLE, fine finger movements normal, no pronator drift. No focal weakness. Mild bilateral essential tremor right more than left Sensory: normal and symmetric to light touch, pinprick, and  vibration  Coordination: finger-nose-finger, heel-to-shin bilaterally, no dysmetria Reflexes: Brachioradialis 2/2, biceps 2/2, triceps 2/2, patellar 2/2, Achilles 2/2, plantar responses were flexor bilaterally. Gait and Station: Rising up from seated position without assistance, normal stance,  moderate stride, good arm swing, smooth turning, able to perform tiptoe, and heel walking without difficulty. Tandem without difficulty   DIAGNOSTIC DATA (LABS, IMAGING, TESTING) None to review    ASSESSMENT AND PLAN  72 y.o. year old female  has a past medical history of Cancer;  Hypertension; Pulmonary sarcoidosis; Anxiety; Esophageal reflux; Anemia; Ventricular tachycardia (paroxysmal); Dyslipidemia; Tremor. She has stopped her Valium since last seen. She's been placed on Ativan by her pulmonologist but has only taken one dose so she does not know if this will be effective for her tremor. She is asking about going back on primidone, however discussed with her that she had severe headaches previously, reviewed all the medications that she has failed in the past with her. Additional 15 minutes spent face-to-face with patient, discussing   her options for tremor control. She is going to try the Ativan for now Try using weighted utensils when eating F/U in 3 months  Nilda Riggs, [facility], [facility], APRN  [facility] [address], [address] [place], [place] [address] [phone]

## 2013-07-21 ENCOUNTER — Encounter: Payer: Self-pay | Admitting: Cardiovascular Disease

## 2013-07-21 ENCOUNTER — Ambulatory Visit (INDEPENDENT_AMBULATORY_CARE_PROVIDER_SITE_OTHER): Payer: Medicare Other | Admitting: Cardiovascular Disease

## 2013-07-21 VITALS — BP 130/70 | HR 110 | Resp 20 | Ht 63.5 in | Wt 139.0 lb

## 2013-07-21 DIAGNOSIS — I10 Essential (primary) hypertension: Secondary | ICD-10-CM

## 2013-07-21 DIAGNOSIS — G250 Essential tremor: Secondary | ICD-10-CM

## 2013-07-21 MED ORDER — HYDROCHLOROTHIAZIDE 25 MG PO TABS: 12.5000 mg | ORAL_TABLET | Freq: Every day | ORAL | Status: DC

## 2013-07-21 NOTE — Progress Notes (Signed)
Patient ID: Danielle Peters, female   DOB: 1941/06/01, 72 y.o.   MRN: [id number]     Reason for office visit HTN, history of SVT  Danielle Peters continues to have problems with dizziness only when she is upright for longer periods of time. She has to hold onto the railing when singing at church. She has also noticed some heart racing, especially after she takes her morning antihypertensive medications. She no longer takes inhalers. Her neurologist has switched her from primidone to buspirone for worsening essential tremor.  Her blood pressure drops by roughly 15-20 mm Hg when she stands up in our office. Her weight is unchanged.  She was quite upset when she came into the office today, as she almost had an accident in the parking lot. Her heart rate was fast when she first checked in, reduced to roughly 90 beats per minute by the time I saw her.  Allergies  Allergen Reactions  . Dextromethorphan Polistirex Er Other (See Comments)    SOB  . Primidone     Severe Headaches  . Albuterol     REACTION: tachycardia  . Asa [Aspirin]     Adult dose  . Clonazepam Hives  . Loratadine   . Simvastatin     Myalgias     Current Outpatient Prescriptions  Medication Sig Dispense Refill  . aspirin 81 MG tablet Take 81 mg by mouth every other day.       . fluticasone (CUTIVATE) 0.05 % cream       . hydrochlorothiazide 25 MG tablet Take 25 mg by mouth daily.       . meclizine (ANTIVERT) 25 MG tablet 1 tablet daily.      [name] omeprazole (PRILOSEC) 20 MG capsule Take 20 mg by mouth 2 (two) times daily.       . prednisoLONE sodium phosphate (INFLAMASE FORTE) 1 % ophthalmic solution 1 drop. One drop in both eye every morning.      . traMADol-acetaminophen (ULTRACET) 37.5-325 MG per tablet Take 1 tablet by mouth every 6 (six) hours as needed for pain.      . valsartan (DIOVAN) 80 MG tablet Take 80 mg by mouth daily.      . busPIRone (BUSPAR) 7.5 MG tablet Take 7.5 mg by mouth 2 (two) times daily.        No current facility-administered medications for this visit.    Past Medical History  Diagnosis Date  . Cancer     breast ca  . Hypertension   . Pulmonary sarcoidosis   . Anxiety   . Esophageal reflux   . Anemia   . 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 Dyslipidemia     myaligia with statins  . Tremor     upper extremities  . Chest pain     echo 01/08/10- nl lv; mild aortic sclerosis; myoview 01/08/10- no ischemia    Past Surgical History  Procedure Laterality Date  . Left breast lumpectomy,chemo  2004    xrt  . Skin biopsy      No family history of cardiovascular illness or other inheritable disorders. Her mother died at age 78.[name]  History   Social History  . Marital Status: Divorced    Spouse Name: N/A    Number of Children: 2  . Years of Education: 12   Occupational History  . school Engineer, petroleum    Social History Main Topics  .  Smoking status: Never Smoker   . Smokeless tobacco: Never Used  . Alcohol Use: No  . Drug Use: No  . Sexual Activity: Not on file   Other Topics Concern  . Not on file   Social History Narrative   Patient lives at home alone. Patient is retired.Pateint has high school education. And went to [employer].   Right handed.   Caffeine- one cup daily.    Review of systems: The patient specifically denies any chest pain at rest or with exertion, dyspnea at rest or with exertion, orthopnea, paroxysmal nocturnal dyspnea, syncope, palpitations, focal neurological deficits, intermittent claudication, lower extremity edema, unexplained weight gain, cough, hemoptysis or wheezing.  The patient also denies abdominal pain, nausea, vomiting, dysphagia, diarrhea, constipation, polyuria, polydipsia, dysuria, hematuria, frequency, urgency, abnormal bleeding or bruising, fever, chills, unexpected weight changes, mood swings, change in skin or hair texture, change in voice  quality, auditory or visual problems, allergic reactions or rashes, new musculoskeletal complaints other than usual "aches and pains".   PHYSICAL EXAM BP 130/70  Pulse 110  Resp 20  Ht 5' 3.5" (1.613 m)  Wt 139 lb (63.05 kg)  BMI 24.23 kg/m2  General: Alert, oriented x3, no distress Head: no evidence of trauma, PERRL, EOMI, no exophtalmos or lid lag, no myxedema, no xanthelasma; normal ears, nose and oropharynx Neck: normal jugular venous pulsations and no hepatojugular reflux; brisk carotid pulses without delay and no carotid bruits Chest: clear to auscultation, no signs of consolidation by percussion or palpation, normal fremitus, symmetrical and full respiratory excursions Cardiovascular: normal position and quality of the apical impulse, regular rhythm, normal first and second heart sounds, no murmurs, rubs or gallops Abdomen: no tenderness or distention, no masses by palpation, no abnormal pulsatility or arterial bruits, normal bowel sounds, no hepatosplenomegaly Extremities: no clubbing, cyanosis or edema; 2+ radial, ulnar and brachial pulses bilaterally; 2+ right femoral, posterior tibial and dorsalis pedis pulses; 2+ left femoral, posterior tibial and dorsalis pedis pulses; no subclavian or femoral bruits Neurological: grossly nonfocal except prominent symmetrical tremor of both hands both at rest and intentional   EKG: Sinus tachycardia otherwise normal  Lipid Panel  No results found for this basename: chol, trig, hdl, cholhdl, vldl, ldlcalc    BMET    Component Value Date/Time   NA 136 01/27/2011 1554   K 3.7 01/27/2011 1554   CL 101 01/27/2011 1554   CO2 31 05/05/2010 1438   GLUCOSE 115* 01/27/2011 1554   BUN 15 01/27/2011 1554   CREATININE 1.2 01/27/2011 1554   CALCIUM 10.0 05/05/2010 1438   GFRNONAA 59* 05/05/2010 1438   GFRAA  Value: >60        The eGFR has been calculated using the MDRD equation. This calculation has not been validated in all clinical situations. eGFR's  persistently <60 mL/min signify possible Chronic Kidney Disease. 05/05/2010 1438     ASSESSMENT AND PLAN TREMOR, ESSENTIAL She asked for my opinion regarding her essential tremor and whether she should switch back to lorazepam. It seems that this medication did help her. She is worried about addiction potential. I have told her that she should take her neurologist advised on this topic and that she should give any new medication prescribed for the tremor at chance to fully work. In the end he'll be a trial and error approach and should use a medicine that provides the most relief. She is aware of the fact that there is no cure for her essential tremor.  Essential hypertension  I think her dizziness is caused by orthostatic hypotension, in turn potentiated by diuretic therapy. I've asked her to decrease the hydrochlorothiazide to only 12.5 mg daily.  Paroxysmal supraventricular tachycardia Her "racing heart" today is clearly sinus tachycardia. The pattern of her palpitations is also consistent with sinus tachycardia since the episodes have a gradual onset and termination and they are clearly associated with increased adrenergic tone. I do not think she has a recurrence of true SVT.   Orders Placed This Encounter  Procedures  . EKG 12-Lead   Meds ordered this encounter  Medications  . busPIRone (BUSPAR) 7.5 MG tablet    Sig: Take 7.5 mg by mouth 2 (two) times daily.  . traMADol-acetaminophen (ULTRACET) 37.5-325 MG per tablet    Sig: Take 1 tablet by mouth every 6 (six) hours as needed for pain.    Junious Silk, MD, [facility] [phone] office [phone] pager

## 2013-07-21 NOTE — Patient Instructions (Signed)
Your physician has recommended you make the following change in your medication: reduce hydrochlorothiazide to 12.5 mg daily Your physician recommends that you schedule a follow-up appointment in: 12 months

## 2013-07-21 NOTE — Assessment & Plan Note (Signed)
Her "racing heart" today is clearly sinus tachycardia. The pattern of her palpitations is also consistent with sinus tachycardia since the episodes have a gradual onset and termination and they are clearly associated with increased adrenergic tone. I do not think she has a recurrence of true SVT.

## 2013-07-21 NOTE — Assessment & Plan Note (Signed)
I think her dizziness is caused by orthostatic hypotension, in turn potentiated by diuretic therapy. I've asked her to decrease the hydrochlorothiazide to only 12.5 mg daily.

## 2013-07-21 NOTE — Assessment & Plan Note (Signed)
She asked for my opinion regarding her essential tremor and whether she should switch back to lorazepam. It seems that this medication did help her. She is worried about addiction potential. I have told her that she should take her neurologist advised on this topic and that she should give any new medication prescribed for the tremor at chance to fully work. In the end he'll be a trial and error approach and should use a medicine that provides the most relief. She is aware of the fact that there is no cure for her essential tremor.

## 2013-07-24 ENCOUNTER — Encounter: Payer: Self-pay | Admitting: Cardiovascular Disease

## 2013-09-04 ENCOUNTER — Encounter: Payer: Self-pay | Admitting: Podiatry

## 2013-09-04 ENCOUNTER — Ambulatory Visit (INDEPENDENT_AMBULATORY_CARE_PROVIDER_SITE_OTHER): Payer: Medicare Other | Admitting: Podiatry

## 2013-09-04 VITALS — BP 163/78 | HR 94 | Resp 12 | Ht 63.0 in | Wt 144.0 lb

## 2013-09-04 DIAGNOSIS — B351 Tinea unguium: Secondary | ICD-10-CM

## 2013-09-04 DIAGNOSIS — M79609 Pain in unspecified limb: Secondary | ICD-10-CM

## 2013-09-05 NOTE — Progress Notes (Signed)
Subjective:     Patient ID: Danielle Peters, female   DOB: [birth year], 72 y.o.   MRN: [id number]  HPI patient states cut my toenails they are bothering me and painful   Review of Systems     Objective:   Physical Exam Neurovascular status intact with thick nail disease 1-5 both feet with mycotic   infection occurring within nailbeds Assessment:     Mycotic nail infection with pain 1-5 both feet    Plan:     Debridement nailbeds 1-5 both feet with no iatrogenic bleeding noted

## 2013-09-28 ENCOUNTER — Encounter: Payer: Self-pay | Admitting: Neurology

## 2013-09-28 ENCOUNTER — Ambulatory Visit (INDEPENDENT_AMBULATORY_CARE_PROVIDER_SITE_OTHER): Payer: Medicare Other | Admitting: Neurology

## 2013-09-28 VITALS — BP 148/85 | HR 110 | Ht 63.0 in | Wt 145.0 lb

## 2013-09-28 DIAGNOSIS — G250 Essential tremor: Secondary | ICD-10-CM

## 2013-09-28 DIAGNOSIS — I10 Essential (primary) hypertension: Secondary | ICD-10-CM

## 2013-09-28 DIAGNOSIS — F411 Generalized anxiety disorder: Secondary | ICD-10-CM

## 2013-09-28 MED ORDER — TRAMADOL-ACETAMINOPHEN 37.5-325 MG PO TABS: ORAL_TABLET | ORAL | Status: DC

## 2013-09-28 MED ORDER — PRIMIDONE 50 MG PO TABS: 50.0000 mg | ORAL_TABLET | Freq: Three times a day (TID) | ORAL | Status: DC

## 2013-09-28 NOTE — Progress Notes (Signed)
[facility]  HISTORY OF PRESENT ILLNESS: Mrs. 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  She presenting with few years history of bilateral hands tremor, getting worse when she stretched out her hands, or using utensils, she has describe difficulty feeding herself, cause a lot of social embarrasement, she denies significant gait difficulty.  She denied bilateral lower exremity paresthesia weakness, but does complain fingertips numbness tingling.  She denied loss of smell, REM sleep disorder, has anxiety symptoms, denied family history of tremor  Over the past few years, we have tried primadone, while taking 50mg  1/2 once a day , there was no significant improvement at that time, she also complains of headaches, but also tried diazepam, Ativan, without significant improvement  Today she complains continued bilateral hand shaking, causing social embarrassment, design treatment, stating tramadol compared to the others seems to help her more,  REVIEW OF SYSTEMS: Full 14 system review of systems performed and notable only for tremor, dizziness, Dizziness, tremor  ALLERGIES: Allergies  Allergen Reactions  . Dextromethorphan Polistirex Er Other (See Comments)    SOB  . Primidone     Severe Headaches  . Albuterol     REACTION: tachycardia  . Asa [Aspirin]     Adult dose  . Clonazepam Hives  . Loratadine   . Simvastatin     Myalgias     HOME MEDICATIONS: Outpatient Prescriptions Prior to Visit  Medication Sig Dispense Refill  . aspirin 81 MG tablet Take 81 mg by mouth every other day.       . busPIRone (BUSPAR) 7.5 MG tablet Take 7.5  mg by mouth 2 (two) times daily.      . fluticasone (CUTIVATE) 0.05 % cream       . gabapentin (NEURONTIN) 300 MG capsule 2 (two) times daily.       . hydrochlorothiazide (HYDRODIURIL) 25 MG tablet Take 0.5 tablets (12.5 mg total) by mouth daily.  45 tablet  3  . meclizine (ANTIVERT) 25 MG tablet 1 tablet daily.      [name] omeprazole (PRILOSEC) 20 MG capsule Take 20 mg by mouth 2 (two) times daily.       . traMADol-acetaminophen (ULTRACET) 37.5-325 MG per tablet Take 1 tablet by mouth every 6 (six) hours as needed for pain.      . valsartan (DIOVAN) 80 MG tablet Take 80 mg by mouth daily.      . prednisoLONE sodium phosphate (INFLAMASE FORTE) 1 % ophthalmic solution 1 drop. One drop in both eye every morning.       No facility-administered medications prior to visit.    PAST MEDICAL HISTORY: Past Medical History  Diagnosis Date  . Cancer     breast ca  . Hypertension   . Pulmonary sarcoidosis   . Anxiety   . Esophageal reflux   . Anemia   . 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 Dyslipidemia     myaligia with statins  . Tremor     upper extremities  . Chest pain     echo 01/08/10- nl lv; mild aortic sclerosis; myoview 01/08/10- no ischemia    PAST SURGICAL HISTORY: Past Surgical History  Procedure Laterality  Date  . Left breast lumpectomy,chemo  2004    xrt  . Skin biopsy      FAMILY HISTORY: No family history on file.  SOCIAL HISTORY: History   Social History  . Marital Status: Divorced    Spouse Name: N/A    Number of Children: 2  . Years of Education: 12   Occupational History  . school Engineer, petroleum    Social History Main Topics  . Smoking status: Never Smoker   . Smokeless tobacco: Never Used  . Alcohol Use: No  . Drug Use: No  . Sexual Activity: Not on file   Other Topics Concern  . Not on file   Social History Narrative   Patient lives at home alone. Patient is retired.Pateint  has high school education. And went to [employer].   Right handed.   Caffeine- one cup daily.     PHYSICAL EXAM  Filed Vitals:   09/28/13 1309  BP: 148/85  Pulse: 110  Height: 5\' 3"  (1.6 m)  Weight: 145 lb (65.772 kg)   Body mass index is 25.69 kg/(m^2).  Generalized: Well developed, in no acute distress  Head: normocephalic and atraumatic,. Oropharynx benign  Neck: Supple, no carotid bruits  Cardiac: Regular rate rhythm, no murmur  Musculoskeletal: No deformity   Neurological examination   Mentation: Alert oriented to time, place, history taking. Follows all commands speech and language fluent  Cranial nerve II-XII: Pupils were equal round reactive to light extraocular movements were full, visual field were full on confrontational test. Facial sensation and strength were normal. hearing loss left ear. Uvula tongue midline. head turning and shoulder shrug and were normal and symmetric.Tongue protrusion into cheek strength was normal. She has occasional head No-No tremor. Motor: normal bulk and tone, full strength in bilateral upper and lower extremity muscles, she has bilateral upper extremity postural tremor, right more than left,   There was no significant rigidity  Sensory: normal and symmetric to light touch, pinprick, and  vibration  Coordination: finger-nose-finger, heel-to-shin bilaterally, no dysmetria Reflexes: Brachioradialis 2/2, biceps 2/2, triceps 2/2, patellar 2/2, Achilles 2/2, plantar responses were flexor bilaterally. Gait and Station: Rising up from seated position without assistance,  Leaning forward,  moderate stride, good arm swing, smooth turning, able to perform tiptoe, and heel walking without difficulty. Tandem without difficulty   ASSESSMENT AND PLAN  72 y.o. year old female with essential tremor, there was no parkinsonian features on examination  1. She wants to retry primidone again, I had prescribed 50 mg 3 times a day potential side effects was  explained,, 2.  may also consider beta blocker propranolol at next visit,  3 tramadol as needed for headaches  4, Return to clinic with Eber Jones in 6 months   Levert Feinstein, M.D. [facility] [address], [address] [place], [place] [address] [phone]

## 2013-09-28 NOTE — Patient Instructions (Signed)
Primidone 50 mg, one tablet every morning for one week, then one tablet twice a day for one week,, then one tablet 3 times a day

## 2013-09-28 NOTE — Addendum Note (Signed)
Addended by: Levert Feinstein on: 09/28/2013 01:44 PM   Modules accepted: Orders

## 2013-09-29 ENCOUNTER — Other Ambulatory Visit: Payer: Self-pay

## 2013-09-29 NOTE — Telephone Encounter (Signed)
Rx faxed

## 2013-11-07 DIAGNOSIS — R002 Palpitations: Secondary | ICD-10-CM | POA: Insufficient documentation

## 2013-11-10 ENCOUNTER — Ambulatory Visit (INDEPENDENT_AMBULATORY_CARE_PROVIDER_SITE_OTHER)
Admission: RE | Admit: 2013-11-10 | Discharge: 2013-11-10 | Disposition: A | Discharge status: Home / Self Care | Payer: Medicare Other | Source: Ambulatory Visit | Attending: Internal Medicine | Admitting: Internal Medicine

## 2013-11-10 ENCOUNTER — Encounter: Payer: Self-pay | Admitting: Internal Medicine

## 2013-11-10 ENCOUNTER — Ambulatory Visit (INDEPENDENT_AMBULATORY_CARE_PROVIDER_SITE_OTHER): Payer: Medicare Other | Admitting: Internal Medicine

## 2013-11-10 VITALS — BP 122/66 | HR 99 | Ht 65.5 in | Wt 145.6 lb

## 2013-11-10 DIAGNOSIS — D869 Sarcoidosis, unspecified: Secondary | ICD-10-CM

## 2013-11-10 DIAGNOSIS — G250 Essential tremor: Secondary | ICD-10-CM

## 2013-11-10 DIAGNOSIS — R0789 Other chest pain: Secondary | ICD-10-CM

## 2013-11-10 DIAGNOSIS — G252 Other specified forms of tremor: Secondary | ICD-10-CM

## 2013-11-10 DIAGNOSIS — Z853 Personal history of malignant neoplasm of breast: Secondary | ICD-10-CM

## 2013-11-10 MED ORDER — DIAZEPAM 2 MG PO TABS: 2.0000 mg | ORAL_TABLET | Freq: Four times a day (QID) | ORAL | Status: DC | PRN

## 2013-11-10 NOTE — Progress Notes (Signed)
07/23/11- 73 year old female never smoker followed for sarcoid, chronic bronchitis complicated by history of right breast cancer, tachycardia, GERD, anxiety, glaucoma.. Last here- 12/29/2010. She is here today with concern about an intermittent pain at the inferior aspect of her right scapula, noticed over the past month. It is increased by stress. She also feels tender across the lower left inferior costal margin and she notices occasional heartburn. She recently restarted exercise classes at the [facility]. She denies fever productive cough sore throat nodes or exertional chest pain. She recently had cardiology followup at [facility] where she was told heart problems might be from sarcoid. She doesn't understand anything specific about that.  09/03/11-  73 year old female never smoker followed for sarcoid, chronic bronchitis complicated by history of right breast cancer, tachycardia, GERD, anxiety, glaucoma.. She has felt well with no acute issues. She does comment on annoying tremor of her hands. Treated by neurology with primidone which caused headache and rapid heart rate. Cold air increased her cough a little bit. She does not think she has an infection and denies fever, sweats, swollen glands, pain or purulent discharge. ACE level -07/23/11- 61 which may indicate low grade activity despite her age. CXR- 07/23/2011-patchy density right middle lobe, suspected to be sarcoid by the radiologist. We discussed the fact that this is in the field of radiation therapy for right breast cancer and may be radiation change in the underlying lung.  12/04/11-  73 year old female never smoker followed for sarcoid, chronic bronchitis complicated by history of right breast cancer, tachycardia, GERD, anxiety, glaucoma.[name]  PCP Dr Dagmar Hait She keeps a mild cough which she feels in the left upper chest, productive of small amounts of white sputum with no pain fever or blood. Lites night sweats especially  if she is before bedtime.  Cough syrup causes "funny breathing", without exertional dyspnea. She does aerobics at the [employer]. With exercise she gets an ache in the left upper chest which will persist for about a day. Continues Timoptic eyedrops for glaucoma.  01/01/12- 73 year old female never smoker followed for sarcoid, chronic bronchitis complicated by history of right breast cancer, tachycardia, GERD, anxiety, glaucoma.[name]  PCP Dr Dagmar Hait CXR 12/08/11- images reviewed with her Comparison: Chest x-ray of 07/23/2011  Findings: Vague opacity in the right mid lung and hilar and  mediastinal adenopathy is stable in this patient with a history of  sarcoidosis. No definite active process is seen. Heart size is  stable. No bony abnormality is noted.  IMPRESSION:  Stable hilar - medial and adenopathy with the opacity in the right  mid lung in this patient with sarcoidosis. No active process.  Original Report Authenticated By: Joretta Bachelor, M.D.  Still coughs, white phlegm or dry. Primidone was changed to diastolic for tremor without increased chest tightness or wheeze. Continues timolol for glaucoma. PFT-12/21/11- normal spirometry with insignificant response to bronchodilator, normal lung volumes, diffusion at lower limits of normal. 6 minute walk test-96%, 96%, 99%, 324 m. Excellent oxygenation with no desaturation during this exercise.  02/09/12- 73 year old female never smoker followed for sarcoid, chronic bronchitis complicated by history of right breast cancer, tachycardia, GERD, anxiety, glaucoma.[name]  PCP Dr Dagmar Hait No longer feels she needs Qvar inhaler. No longer needs benzonatate Perles. Not much cough. She did not need to ask her eye doctor about Timolol eyedrops because she was not coughing. Asks about a tender point at the left lower costal margin Last ACE level 12/04/11- 5(L)  12/19/12- 73 year old female never smoker followed  for sarcoid, chronic bronchitis complicated by history of right  breast cancer, tachycardia, GERD, anxiety, glaucoma.[name]  PCP Dr Dagmar Hait ACUTE VISIT: recently dx'd by  Dr. Dagmar Hait with PNA; wanted to be rechecked by CY and at the request of Dr. Dagmar Hait. His CXR report not yet available.  She had incr cough with white sputum, no fever starting 2 weeks ago. Finished 7 days of levaquin 5 days ago. Now little dry cough. Using Qvar- had been wheezing. Feels washed out. Bilateral lower rib ache from cough. CXR 12/08/11 IMPRESSION:  Stable hilar - medial and adenopathy with the opacity in the right  mid lung in this patient with sarcoidosis. No active process.  Original Report Authenticated By: Joretta Bachelor, M.D.  03/21/13- 73 year old female never smoker followed for sarcoid, chronic bronchitis complicated by history of right breast cancer/ XRT, tachycardia, GERD, anxiety, glaucoma.[name]  PCP Dr Dagmar Hait Husband just died of lung cancer She had Valium but dislikes the rebound and asks if I could give her something for anxiety for a few days. Breathing has been stable with some cough, no sputum, no fever. CXR 12/21/12 IMPRESSION:  No active disease. Stable hilar adenopathy. Again noted post  radiation changes in right upper lobe.  Original Report Authenticated By: Lahoma Crocker, M.D.  06/21/13- 73 year old female never smoker followed for sarcoid, chronic bronchitis complicated by history of right breast cancer/ XRT, tachycardia, GERD, anxiety, glaucoma.[name]  PCP Dr Dagmar Hait  FOLLOWS FOR:  Dry cough. Denies wheezing or SOB otherwise unchanged since last OV Denies change in her dry cough without significant shortness of breath.  Valium did not help her tremor-has a neurology appointment. New complaint of diffuse itching especially upper chest and hands without rash. Tried Aquaphor. Wants to delay flu vaccine.  11/10/13- 73 year old female never smoker followed for sarcoid, chronic bronchitis complicated by history of right breast cancer/ XRT, tachycardia, GERD, anxiety, glaucoma, tremor.[name]  PCP Dr  Dagmar Hait ACUTE VISIT: has been having chest pain x 2 weeks and breathing hard. Runny nose and eyes as well. Seen by PCP-EKG neg. Patient describes aching pain left anterior chest wall, originally intermittent and now constant. PCP did swab negative for flu and recommended Tylenol. She took tramadol for headache, and says that's when the chest started hurting. Tremor is bothering her more. Neurologist in November restarted primidone which has not helped, although she admits not following directions. She does not plan to followup with that neurologist.  ROS- see HPI Constitutional:   No-   weight loss, night sweats, fevers, chills,+ fatigue, lassitude. HEENT:   No-  headaches, difficulty swallowing, tooth/dental problems, sore throat,       No-  sneezing, itching, ear ache, nasal congestion, post nasal drip,  CV:  + chest pain, orthopnea, PND, swelling in lower extremities, anasarca, dizziness, palpitations Resp: No-   shortness of breath with exertion or at rest.             No-  productive cough,  + non-productive cough,  No-  coughing up of blood.              No-   change in color of mucus.  No- wheezing.   Skin: no-pruritus or rash  GI:  No-   heartburn, indigestion, abdominal pain, nausea, vomiting,  GU:. MS:  No-   joint pain or swelling.  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 No depression or anxiety.  No memory loss.  Obj General- Alert, Oriented, Affect-appropriate,  Distress- none acute Skin- rash-none, lesions- none, excoriation- none Lymphadenopathy- none Head- atraumatic            Eyes- Gross vision intact, PERRLA, conjunctivae clear secretions            Ears- Hearing, canals-normal            Nose- Clear, no-Septal dev, mucus, polyps, erosion, perforation             Throat- Mallampati II , mucosa clear , drainage- none, tonsils- atrophic. Missing teeth Neck- flexible , trachea midline, no stridor , thyroid nl, carotid no  bruit Chest - symmetrical excursion , unlabored           Heart/CV- RRR with no extra beats , no murmur , no gallop  , no rub, nl s1 s2                           - JVD- none , edema- none, stasis changes- none, varices- none           Lung- + bilateral unlabored squeaks,  Work of breathing is not increased. cough- none ,                     dullness-none, rub- none           Chest wall- treatment Scar R breast, L upper anterior chest Abd- No HSM Br/ Gen/ Rectal- Not done, not indicated Extrem- cyanosis- none, clubbing, none, atrophy- none, strength- nl Neuro- +Tremor getting worse.

## 2013-11-10 NOTE — Patient Instructions (Addendum)
Order- CXR dx atypical left anterior chest pain, sarcoid, hx breast Ca  Script to try diazepam for tremor.

## 2013-12-04 ENCOUNTER — Ambulatory Visit (INDEPENDENT_AMBULATORY_CARE_PROVIDER_SITE_OTHER): Payer: Medicare Other | Admitting: Podiatry

## 2013-12-04 ENCOUNTER — Encounter: Payer: Self-pay | Admitting: Podiatry

## 2013-12-04 VITALS — BP 156/82 | HR 100 | Resp 12

## 2013-12-04 DIAGNOSIS — M79609 Pain in unspecified limb: Secondary | ICD-10-CM

## 2013-12-04 DIAGNOSIS — B351 Tinea unguium: Secondary | ICD-10-CM

## 2013-12-04 NOTE — Progress Notes (Signed)
Subjective:     Patient ID: Danielle Peters, female   DOB: 05-02-1941, 73 y.o.   MRN: [id number]  HPI patient presents with nail pain and thickness 1-5 of both feet that she cannot cut herself   Review of Systems     Objective:   Physical Exam Neurovascular status unchanged patient is well oriented x3 with thick painful nail bed 1-5 of both feet    Assessment:     Mycotic nail infection with pain 1-5 both feet    Plan:     Debridement painful nailbeds 1-5 both feet with no bleeding noted

## 2013-12-09 DIAGNOSIS — R0789 Other chest pain: Secondary | ICD-10-CM | POA: Insufficient documentation

## 2013-12-09 NOTE — Assessment & Plan Note (Signed)
She only took primidone intermittently and did not build to maintenance as the neurologist her record. She can go back to that, but I offered to let her try low-dose Valium which had helped her in past years.

## 2013-12-09 NOTE — Assessment & Plan Note (Addendum)
This is not angina. Doubt PE. Possibly musculoskeletal. Plan-chest x-ray

## 2013-12-09 NOTE — Assessment & Plan Note (Signed)
Chronic scarring but disease is inactive

## 2013-12-11 ENCOUNTER — Ambulatory Visit: Payer: Medicare Other | Admitting: Internal Medicine

## 2013-12-19 ENCOUNTER — Ambulatory Visit: Payer: Medicare Other | Admitting: Internal Medicine

## 2014-01-08 ENCOUNTER — Ambulatory Visit (INDEPENDENT_AMBULATORY_CARE_PROVIDER_SITE_OTHER): Payer: Medicare Other | Admitting: Internal Medicine

## 2014-01-08 ENCOUNTER — Encounter: Payer: Self-pay | Admitting: Internal Medicine

## 2014-01-08 VITALS — BP 118/70 | HR 97 | Ht 65.5 in | Wt 136.8 lb

## 2014-01-08 DIAGNOSIS — G250 Essential tremor: Secondary | ICD-10-CM

## 2014-01-08 DIAGNOSIS — D869 Sarcoidosis, unspecified: Secondary | ICD-10-CM

## 2014-01-08 DIAGNOSIS — G252 Other specified forms of tremor: Secondary | ICD-10-CM

## 2014-01-08 DIAGNOSIS — J42 Unspecified chronic bronchitis: Secondary | ICD-10-CM

## 2014-01-08 MED ORDER — AZITHROMYCIN 250 MG PO TABS: ORAL_TABLET | ORAL | Status: DC

## 2014-01-08 MED ORDER — BECLOMETHASONE DIPROPIONATE 80 MCG/ACT IN AERS: INHALATION_SPRAY | RESPIRATORY_TRACT | Status: DC

## 2014-01-08 NOTE — Progress Notes (Signed)
07/23/11- 73 year old female never smoker followed for sarcoid, chronic bronchitis complicated by history of right breast cancer, tachycardia, GERD, anxiety, glaucoma.. Last here- 12/29/2010. She is here today with concern about an intermittent pain at the inferior aspect of her right scapula, noticed over the past month. It is increased by stress. She also feels tender across the lower left inferior costal margin and she notices occasional heartburn. She recently restarted exercise classes at the [facility]. She denies fever productive cough sore throat nodes or exertional chest pain. She recently had cardiology followup at [facility] where she was told heart problems might be from sarcoid. She doesn't understand anything specific about that.  09/03/11-  73 year old female never smoker followed for sarcoid, chronic bronchitis complicated by history of right breast cancer, tachycardia, GERD, anxiety, glaucoma.. She has felt well with no acute issues. She does comment on annoying tremor of her hands. Treated by neurology with primidone which caused headache and rapid heart rate. Cold air increased her cough a little bit. She does not think she has an infection and denies fever, sweats, swollen glands, pain or purulent discharge. ACE level -07/23/11- 61 which may indicate low grade activity despite her age. CXR- 07/23/2011-patchy density right middle lobe, suspected to be sarcoid by the radiologist. We discussed the fact that this is in the field of radiation therapy for right breast cancer and may be radiation change in the underlying lung.  12/04/11-  73 year old female never smoker followed for sarcoid, chronic bronchitis complicated by history of right breast cancer, tachycardia, GERD, anxiety, glaucoma.[name]  PCP Dr Dagmar Hait She keeps a mild cough which she feels in the left upper chest, productive of small amounts of white sputum with no pain fever or blood. Lites night sweats especially  if she is before bedtime.  Cough syrup causes "funny breathing", without exertional dyspnea. She does aerobics at the [employer]. With exercise she gets an ache in the left upper chest which will persist for about a day. Continues Timoptic eyedrops for glaucoma.  01/01/12- 73 year old female never smoker followed for sarcoid, chronic bronchitis complicated by history of right breast cancer, tachycardia, GERD, anxiety, glaucoma.[name]  PCP Dr Dagmar Hait CXR 12/08/11- images reviewed with her Comparison: Chest x-ray of 07/23/2011  Findings: Vague opacity in the right mid lung and hilar and  mediastinal adenopathy is stable in this patient with a history of  sarcoidosis. No definite active process is seen. Heart size is  stable. No bony abnormality is noted.  IMPRESSION:  Stable hilar - medial and adenopathy with the opacity in the right  mid lung in this patient with sarcoidosis. No active process.  Original Report Authenticated By: Joretta Bachelor, M.D.  Still coughs, white phlegm or dry. Primidone was changed to diastolic for tremor without increased chest tightness or wheeze. Continues timolol for glaucoma. PFT-12/21/11- normal spirometry with insignificant response to bronchodilator, normal lung volumes, diffusion at lower limits of normal. 6 minute walk test-96%, 96%, 99%, 324 m. Excellent oxygenation with no desaturation during this exercise.  02/09/12- 73 year old female never smoker followed for sarcoid, chronic bronchitis complicated by history of right breast cancer, tachycardia, GERD, anxiety, glaucoma.[name]  PCP Dr Dagmar Hait No longer feels she needs Qvar inhaler. No longer needs benzonatate Perles. Not much cough. She did not need to ask her eye doctor about Timolol eyedrops because she was not coughing. Asks about a tender point at the left lower costal margin Last ACE level 12/04/11- 5(L)  12/19/12- 73 year old female never smoker followed  for sarcoid, chronic bronchitis complicated by history of right  breast cancer, tachycardia, GERD, anxiety, glaucoma.[name]  PCP Dr Dagmar Hait ACUTE VISIT: recently dx'd by  Dr. Dagmar Hait with PNA; wanted to be rechecked by CY and at the request of Dr. Dagmar Hait. His CXR report not yet available.  She had incr cough with white sputum, no fever starting 2 weeks ago. Finished 7 days of levaquin 5 days ago. Now little dry cough. Using Qvar- had been wheezing. Feels washed out. Bilateral lower rib ache from cough. CXR 12/08/11 IMPRESSION:  Stable hilar - medial and adenopathy with the opacity in the right  mid lung in this patient with sarcoidosis. No active process.  Original Report Authenticated By: Joretta Bachelor, M.D.  03/21/13- 73 year old female never smoker followed for sarcoid, chronic bronchitis complicated by history of right breast cancer/ XRT, tachycardia, GERD, anxiety, glaucoma.[name]  PCP Dr Dagmar Hait Husband just died of lung cancer She had Valium but dislikes the rebound and asks if I could give her something for anxiety for a few days. Breathing has been stable with some cough, no sputum, no fever. CXR 12/21/12 IMPRESSION:  No active disease. Stable hilar adenopathy. Again noted post  radiation changes in right upper lobe.  Original Report Authenticated By: Lahoma Crocker, M.D.  06/21/13- 73 year old female never smoker followed for sarcoid, chronic bronchitis complicated by history of right breast cancer/ XRT, tachycardia, GERD, anxiety, glaucoma.[name]  PCP Dr Dagmar Hait  FOLLOWS FOR:  Dry cough. Denies wheezing or SOB otherwise unchanged since last OV Denies change in her dry cough without significant shortness of breath.  Valium did not help her tremor-has a neurology appointment. New complaint of diffuse itching especially upper chest and hands without rash. Tried Aquaphor. Wants to delay flu vaccine.  11/10/13- 73 year old female never smoker followed for sarcoid, chronic bronchitis complicated by history of right breast cancer/ XRT, tachycardia, GERD, anxiety, glaucoma, tremor.[name]  PCP Dr  Dagmar Hait ACUTE VISIT: has been having chest pain x 2 weeks and breathing hard. Runny nose and eyes as well. Seen by PCP-EKG neg. Patient describes aching pain left anterior chest wall, originally intermittent and now constant. PCP did swab negative for flu and recommended Tylenol. She took tramadol for headache, and says that's when the chest started hurting. Tremor is bothering her more. Neurologist in November restarted primidone which has not helped, although she admits not following directions. She does not plan to followup with that neurologist.  01/08/14- 73 year old female never smoker followed for sarcoid, chronic bronchitis complicated by history of right breast cancer/ XRT, tachycardia, GERD, anxiety, glaucoma, tremor.[name]  PCP Dr Dagmar Hait FOLLOWS FOR:  Breathing doing well, some wheezing-Began Breo Ellipta-reports does not like. Dr Dagmar Hait Rx'd viral bronchitis-Tamiflu. She reports persistent cough productive green and asks Z-Pak. She is frustrated by chronic medications and wants office many she can. Does want to restart Qvar. Complains about primidone given for tremor. Asks alternative since I have treated from her in the past. She is not following regularly with neurology now. CXR 11/10/13- IMPRESSION:  1. There is hyperinflation consistent with COPD. Stable increased  interstitial markings in the right perihilar region may be related  to the patient's sarcoidosis for reflect scarring.  2. There is stable enlargement of hilar lymph nodes bilaterally.  There is no evidence of CHF nor acute alveolar pneumonia.  3. Chest CT scanning may be useful if the patient's symptoms  persist.  Electronically Signed  By: David Martinique  On: 11/10/2013 11:36   ROS- see HPI Constitutional:  No-   weight loss, night sweats, fevers, chills,+ fatigue, lassitude. HEENT:   No-  headaches, difficulty swallowing, tooth/dental problems, sore throat,       No-  sneezing, itching, ear ache, nasal congestion, post nasal  drip,  CV:  + chest pain, orthopnea, PND, swelling in lower extremities, anasarca, dizziness, palpitations Resp: No-   shortness of breath with exertion or at rest.             +  productive cough,  + non-productive cough,  No-  coughing up of blood.              +change in color of mucus.  No- wheezing.   Skin: no-pruritus or rash  GI:  No-   heartburn, indigestion, abdominal pain, nausea, vomiting,  GU:. MS:  No-   joint pain or swelling.  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 No depression or anxiety.  No memory loss.  Obj General- Alert, Oriented, Affect-appropriate, Distress- none acute Skin- rash-none, lesions- none, excoriation- none Lymphadenopathy- none Head- atraumatic            Eyes- Gross vision intact, PERRLA, conjunctivae clear secretions            Ears- Hearing, canals-normal            Nose- Clear, no-Septal dev, mucus, polyps, erosion, perforation             Throat- Mallampati II , mucosa clear , drainage- none, tonsils- atrophic. Missing teeth Neck- flexible , trachea midline, no stridor , thyroid nl, carotid no bruit Chest - symmetrical excursion , unlabored           Heart/CV- RRR with no extra beats , no murmur , no gallop  , no rub, nl s1 s2                           - JVD- none , edema- none, stasis changes- none, varices- none           Lung- + bilateral wet squeaks,  Work of breathing is not increased. cough- none ,                     dullness-none, rub- none           Chest wall- treatment Scar R breast, L upper anterior chest Abd- No HSM Br/ Gen/ Rectal- Not done, not indicated Extrem- cyanosis- none, clubbing, none, atrophy- none, strength- nl Neuro- +Tremor .

## 2014-01-08 NOTE — Patient Instructions (Signed)
Script sent for Z pak  Script sent to return to Qvar 80 inhaler as you request, so stop the [facility]   We had let you try some diazepam for your tremor. You say you like that better than Primodone, so I suggest you discuss that with Dr Dagmar Hait.

## 2014-01-11 ENCOUNTER — Other Ambulatory Visit (HOSPITAL_COMMUNITY): Payer: Self-pay | Admitting: Internal Medicine

## 2014-01-11 ENCOUNTER — Ambulatory Visit (HOSPITAL_COMMUNITY)
Admission: RE | Admit: 2014-01-11 | Discharge: 2014-01-11 | Disposition: A | Discharge status: Home / Self Care | Payer: Medicare Other | Source: Ambulatory Visit | Attending: Vascular Surgery | Admitting: Vascular Surgery

## 2014-01-11 DIAGNOSIS — R0989 Other specified symptoms and signs involving the circulatory and respiratory systems: Secondary | ICD-10-CM | POA: Insufficient documentation

## 2014-01-18 ENCOUNTER — Telehealth: Payer: Self-pay | Admitting: Internal Medicine

## 2014-01-18 NOTE — Telephone Encounter (Signed)
Called and spoke with pt and she is aware of CY recs.  Pt to call back on Monday if not feeling any better.

## 2014-01-18 NOTE — Telephone Encounter (Signed)
Try allegra-D over the weekend and see if it helps.

## 2014-01-18 NOTE — Telephone Encounter (Signed)
Spoke with pt. Reports still having cough and bilateral ear pain. Mucus is white in color. Has not tried any OTC meds. Would like something called in.  Allergies  Allergen Reactions  . Dextromethorphan Polistirex Er Other (See Comments)    SOB  . Primidone     Severe Headaches  . Albuterol     REACTION: tachycardia  . Asa [Aspirin]     Adult dose  . Clonazepam Hives  . Loratadine   . Simvastatin     Myalgias    Current Outpatient Prescriptions on File Prior to Visit  Medication Sig Dispense Refill  . aspirin 81 MG tablet Take 81 mg by mouth every other day.       . beclomethasone (QVAR) 80 MCG/ACT inhaler 2 puffs then rinse mouth, twice daily  1 Inhaler  prn  . diazepam (VALIUM) 2 MG tablet Take 1 tablet (2 mg total) by mouth every 6 (six) hours as needed. For tremor  30 tablet  1  . fluticasone (FLONASE) 50 MCG/ACT nasal spray Place 2 sprays into both nostrils daily.      . Fluticasone Furoate-Vilanterol (BREO ELLIPTA) 100-25 MCG/INH AEPB Inhale 1 puff into the lungs daily.      . hydrochlorothiazide (HYDRODIURIL) 25 MG tablet Take 0.5 tablets (12.5 mg total) by mouth daily.  45 tablet  3  . LOTEMAX 0.5 % GEL       . meclizine (ANTIVERT) 25 MG tablet 1 tablet daily.      . pantoprazole (PROTONIX) 40 MG tablet 1 tablet daily.      . primidone (MYSOLINE) 50 MG tablet Take 1 tablet (50 mg total) by mouth 3 (three) times daily.  90 tablet  6  . traMADol-acetaminophen (ULTRACET) 37.5-325 MG per tablet Take one as needed for headache.  30 tablet  5  . valsartan (DIOVAN) 80 MG tablet Take 80 mg by mouth daily.       No current facility-administered medications on file prior to visit.    CY - please advise. T hanks.

## 2014-01-25 ENCOUNTER — Encounter: Payer: Self-pay | Admitting: Internal Medicine

## 2014-01-25 ENCOUNTER — Ambulatory Visit (INDEPENDENT_AMBULATORY_CARE_PROVIDER_SITE_OTHER): Payer: Medicare Other | Admitting: Internal Medicine

## 2014-01-25 VITALS — BP 144/62 | HR 112 | Ht 65.5 in | Wt 137.2 lb

## 2014-01-25 DIAGNOSIS — C50919 Malignant neoplasm of unspecified site of unspecified female breast: Secondary | ICD-10-CM

## 2014-01-25 DIAGNOSIS — J42 Unspecified chronic bronchitis: Secondary | ICD-10-CM

## 2014-01-25 DIAGNOSIS — D869 Sarcoidosis, unspecified: Secondary | ICD-10-CM

## 2014-01-25 MED ORDER — BUDESONIDE-FORMOTEROL FUMARATE 160-4.5 MCG/ACT IN AERO: INHALATION_SPRAY | RESPIRATORY_TRACT | Status: DC

## 2014-01-25 MED ORDER — BUDESONIDE-FORMOTEROL FUMARATE 160-4.5 MCG/ACT IN AERO: 2.0000 | INHALATION_SPRAY | Freq: Two times a day (BID) | RESPIRATORY_TRACT | Status: DC

## 2014-01-25 NOTE — Patient Instructions (Addendum)
Sample and script to try Symbicort 160 inhaler   2 puffs, then rinse mouth well, twice daily       Instead of Qvar or Breo  Please call as needed

## 2014-01-25 NOTE — Progress Notes (Signed)
07/23/11- 73 year old female never smoker followed for sarcoid, chronic bronchitis complicated by history of right breast cancer, tachycardia, GERD, anxiety, glaucoma.. Last here- 12/29/2010. She is here today with concern about an intermittent pain at the inferior aspect of her right scapula, noticed over the past month. It is increased by stress. She also feels tender across the lower left inferior costal margin and she notices occasional heartburn. She recently restarted exercise classes at the [facility]. She denies fever productive cough sore throat nodes or exertional chest pain. She recently had cardiology followup at [facility] where she was told heart problems might be from sarcoid. She doesn't understand anything specific about that.  09/03/11-  73 year old female never smoker followed for sarcoid, chronic bronchitis complicated by history of right breast cancer, tachycardia, GERD, anxiety, glaucoma.. She has felt well with no acute issues. She does comment on annoying tremor of her hands. Treated by neurology with primidone which caused headache and rapid heart rate. Cold air increased her cough a little bit. She does not think she has an infection and denies fever, sweats, swollen glands, pain or purulent discharge. ACE level -07/23/11- 61 which may indicate low grade activity despite her age. CXR- 07/23/2011-patchy density right middle lobe, suspected to be sarcoid by the radiologist. We discussed the fact that this is in the field of radiation therapy for right breast cancer and may be radiation change in the underlying lung.  12/04/11-  73 year old female never smoker followed for sarcoid, chronic bronchitis complicated by history of right breast cancer, tachycardia, GERD, anxiety, glaucoma.[name]  PCP Dr Dagmar Hait She keeps a mild cough which she feels in the left upper chest, productive of small amounts of white sputum with no pain fever or blood. Lites night sweats especially  if she is before bedtime.  Cough syrup causes "funny breathing", without exertional dyspnea. She does aerobics at the [employer]. With exercise she gets an ache in the left upper chest which will persist for about a day. Continues Timoptic eyedrops for glaucoma.  01/01/12- 73 year old female never smoker followed for sarcoid, chronic bronchitis complicated by history of right breast cancer, tachycardia, GERD, anxiety, glaucoma.[name]  PCP Dr Dagmar Hait CXR 12/08/11- images reviewed with her Comparison: Chest x-ray of 07/23/2011  Findings: Vague opacity in the right mid lung and hilar and  mediastinal adenopathy is stable in this patient with a history of  sarcoidosis. No definite active process is seen. Heart size is  stable. No bony abnormality is noted.  IMPRESSION:  Stable hilar - medial and adenopathy with the opacity in the right  mid lung in this patient with sarcoidosis. No active process.  Original Report Authenticated By: Joretta Bachelor, M.D.  Still coughs, white phlegm or dry. Primidone was changed to diastolic for tremor without increased chest tightness or wheeze. Continues timolol for glaucoma. PFT-12/21/11- normal spirometry with insignificant response to bronchodilator, normal lung volumes, diffusion at lower limits of normal. 6 minute walk test-96%, 96%, 99%, 324 m. Excellent oxygenation with no desaturation during this exercise.  02/09/12- 73 year old female never smoker followed for sarcoid, chronic bronchitis complicated by history of right breast cancer, tachycardia, GERD, anxiety, glaucoma.[name]  PCP Dr Dagmar Hait No longer feels she needs Qvar inhaler. No longer needs benzonatate Perles. Not much cough. She did not need to ask her eye doctor about Timolol eyedrops because she was not coughing. Asks about a tender point at the left lower costal margin Last ACE level 12/04/11- 5(L)  12/19/12- 73 year old female never smoker followed  for sarcoid, chronic bronchitis complicated by history of right  breast cancer, tachycardia, GERD, anxiety, glaucoma.[name]  PCP Dr Dagmar Hait ACUTE VISIT: recently dx'd by  Dr. Dagmar Hait with PNA; wanted to be rechecked by CY and at the request of Dr. Dagmar Hait. His CXR report not yet available.  She had incr cough with white sputum, no fever starting 2 weeks ago. Finished 7 days of levaquin 5 days ago. Now little dry cough. Using Qvar- had been wheezing. Feels washed out. Bilateral lower rib ache from cough. CXR 12/08/11 IMPRESSION:  Stable hilar - medial and adenopathy with the opacity in the right  mid lung in this patient with sarcoidosis. No active process.  Original Report Authenticated By: Joretta Bachelor, M.D.  03/21/13- 73 year old female never smoker followed for sarcoid, chronic bronchitis complicated by history of right breast cancer/ XRT, tachycardia, GERD, anxiety, glaucoma.[name]  PCP Dr Dagmar Hait Husband just died of lung cancer She had Valium but dislikes the rebound and asks if I could give her something for anxiety for a few days. Breathing has been stable with some cough, no sputum, no fever. CXR 12/21/12 IMPRESSION:  No active disease. Stable hilar adenopathy. Again noted post  radiation changes in right upper lobe.  Original Report Authenticated By: Lahoma Crocker, M.D.  06/21/13- 73 year old female never smoker followed for sarcoid, chronic bronchitis complicated by history of right breast cancer/ XRT, tachycardia, GERD, anxiety, glaucoma.[name]  PCP Dr Dagmar Hait  FOLLOWS FOR:  Dry cough. Denies wheezing or SOB otherwise unchanged since last OV Denies change in her dry cough without significant shortness of breath.  Valium did not help her tremor-has a neurology appointment. New complaint of diffuse itching especially upper chest and hands without rash. Tried Aquaphor. Wants to delay flu vaccine.  11/10/13- 73 year old female never smoker followed for sarcoid, chronic bronchitis complicated by history of right breast cancer/ XRT, tachycardia, GERD, anxiety, glaucoma, tremor.[name]  PCP Dr  Dagmar Hait ACUTE VISIT: has been having chest pain x 2 weeks and breathing hard. Runny nose and eyes as well. Seen by PCP-EKG neg. Patient describes aching pain left anterior chest wall, originally intermittent and now constant. PCP did swab negative for flu and recommended Tylenol. She took tramadol for headache, and says that's when the chest started hurting. Tremor is bothering her more. Neurologist in November restarted primidone which has not helped, although she admits not following directions. She does not plan to followup with that neurologist.  01/08/14- 73 year old female never smoker followed for sarcoid, chronic bronchitis complicated by history of right breast cancer/ XRT, tachycardia, GERD, anxiety, glaucoma, tremor.[name]  PCP Dr Dagmar Hait FOLLOWS FOR:  Breathing doing well, some wheezing-Began Breo Ellipta-reports does not like.  CXR 11/10/13- IMPRESSION:  1. There is hyperinflation consistent with COPD. Stable increased  interstitial markings in the right perihilar region may be related  to the patient's sarcoidosis for reflect scarring.  2. There is stable enlargement of hilar lymph nodes bilaterally.  There is no evidence of CHF nor acute alveolar pneumonia.  3. Chest CT scanning may be useful if the patient's symptoms  persist.  Electronically Signed  By: David Martinique  On: 11/10/2013 11:36  01/25/14- 73 year old female never smoker followed for sarcoid, chronic bronchitis complicated by history of right breast cancer/ XRT, tachycardia, GERD, anxiety, glaucoma, tremor.[name]  PCP Dr Dagmar Hait ACUTE VISIT: continues to cough-not any better per patient. Completed the Zpak and phlegm is not colored now.  Denies any wheezing or SOB. Having dark spots in roof of mouth Did not like Breo.  ROS- see HPI Constitutional:   No-   weight loss, night sweats, fevers, chills,+ fatigue, lassitude. HEENT:   No-  headaches, difficulty swallowing, tooth/dental problems, sore throat,       No-  sneezing, itching, ear  ache, nasal congestion, post nasal drip,  CV:  + chest pain, orthopnea, PND, swelling in lower extremities, anasarca, dizziness, palpitations Resp: No-   shortness of breath with exertion or at rest.             No-  productive cough,  + non-productive cough,  No-  coughing up of blood.              No-   change in color of mucus.  No- wheezing.   Skin: no-pruritus or rash  GI:  No-   heartburn, indigestion, abdominal pain, nausea, vomiting,  GU:. MS:  No-   joint pain or swelling.  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 No depression or anxiety.  No memory loss.  Obj General- Alert, Oriented, Affect-appropriate, Distress- none acute Skin- rash-none, lesions- none, excoriation- none Lymphadenopathy- none Head- atraumatic            Eyes- Gross vision intact, PERRLA, conjunctivae clear secretions            Ears- Hearing, canals-normal            Nose- Clear, no-Septal dev, mucus, polyps, erosion, perforation             Throat- Mallampati II , mucosa clear , drainage- none, tonsils- atrophic. Missing teeth Neck- flexible , trachea midline, no stridor , thyroid nl, carotid no bruit Chest - symmetrical excursion , unlabored           Heart/CV- RRR with no extra beats , no murmur , no gallop  , no rub, nl s1 s2                           - JVD- none , edema- none, stasis changes- none, varices- none           Lung- + bilateral unlabored squeaks,  Work of breathing is not increased. cough- none ,                     dullness-none, rub- none           Chest wall- treatment Scar R breast, L upper anterior chest Abd- No HSM Br/ Gen/ Rectal- Not done, not indicated Extrem- cyanosis- none, clubbing, none, atrophy- none, strength- nl Neuro- +Tremor .

## 2014-01-28 NOTE — Assessment & Plan Note (Signed)
I agreed to let her try diazepam which she had used in the past and preferred to primidone. I asked she follow this up with Dr Dagmar Hait.

## 2014-01-28 NOTE — Assessment & Plan Note (Signed)
Residual from sarcoid scarring, now with acute exacerbation Plan-Z-Pak as requested, resume Qvar

## 2014-01-28 NOTE — Assessment & Plan Note (Signed)
Clinical remission 

## 2014-02-25 NOTE — Assessment & Plan Note (Signed)
Followed by oncology without recurrence 

## 2014-02-25 NOTE — Assessment & Plan Note (Signed)
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

## 2014-02-25 NOTE — Assessment & Plan Note (Signed)
Underlying problem is scarring residual from sarcoid. Not sure medications can change this. Plan-try replacing Qvar and Breo with Symbicort

## 2014-03-08 ENCOUNTER — Ambulatory Visit (INDEPENDENT_AMBULATORY_CARE_PROVIDER_SITE_OTHER): Payer: Medicare Other | Admitting: Podiatry

## 2014-03-08 ENCOUNTER — Encounter: Payer: Self-pay | Admitting: Podiatry

## 2014-03-08 DIAGNOSIS — B351 Tinea unguium: Secondary | ICD-10-CM

## 2014-03-08 DIAGNOSIS — M79609 Pain in unspecified limb: Secondary | ICD-10-CM

## 2014-03-08 NOTE — Progress Notes (Signed)
   Subjective:    Patient ID: Danielle Peters, female    DOB: [birth year], 73 y.o.   MRN: [id number]  HPI Pain in bilateral heels, " pain has been there off and on for quite awhile"   Review of Systems     Objective:   Physical Exam        Assessment & Plan:

## 2014-03-08 NOTE — Progress Notes (Signed)
Subjective:     Patient ID: Danielle Peters, female   DOB: 03-May-1941, 73 y.o.   MRN: [id number]  HPI patient presents with nail disease 1-5 both feet that are thick and become difficult to cut and are painful   Review of Systems     Objective:   Physical Exam Neurovascular status unchanged well oriented x3 with thick painful nailbeds 1-5 both feet that are brittle and yellow    Assessment:     Mycotic nail infection with pain 1-5 both feet    Plan:     Debridement painful nailbeds 1-5 both feet with no iatrogenic bleeding noted

## 2014-03-30 ENCOUNTER — Encounter (INDEPENDENT_AMBULATORY_CARE_PROVIDER_SITE_OTHER): Payer: Self-pay

## 2014-03-30 ENCOUNTER — Ambulatory Visit (INDEPENDENT_AMBULATORY_CARE_PROVIDER_SITE_OTHER): Payer: Medicare Other | Admitting: Neurology

## 2014-03-30 ENCOUNTER — Encounter: Payer: Self-pay | Admitting: Neurology

## 2014-03-30 VITALS — BP 135/75 | HR 104 | Ht 64.75 in | Wt 139.0 lb

## 2014-03-30 DIAGNOSIS — M79609 Pain in unspecified limb: Secondary | ICD-10-CM

## 2014-03-30 DIAGNOSIS — M79604 Pain in right leg: Secondary | ICD-10-CM

## 2014-03-30 DIAGNOSIS — I10 Essential (primary) hypertension: Secondary | ICD-10-CM

## 2014-03-30 DIAGNOSIS — G252 Other specified forms of tremor: Principal | ICD-10-CM

## 2014-03-30 DIAGNOSIS — R209 Unspecified disturbances of skin sensation: Secondary | ICD-10-CM

## 2014-03-30 DIAGNOSIS — R202 Paresthesia of skin: Secondary | ICD-10-CM | POA: Insufficient documentation

## 2014-03-30 DIAGNOSIS — G250 Essential tremor: Secondary | ICD-10-CM

## 2014-03-30 MED ORDER — PROPRANOLOL HCL 40 MG PO TABS: 40.0000 mg | ORAL_TABLET | Freq: Two times a day (BID) | ORAL | Status: DC

## 2014-03-30 NOTE — Progress Notes (Signed)
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  She presenting with few years history of bilateral hands tremor, getting worse when she stretched out her hands, or using utensils, she has describe difficulty feeding herself, cause a lot of social embarrasement, she denies significant gait difficulty, she also has head titubation  She denied bilateral lower exremity paresthesia weakness, but does complain fingertips numbness tingling.  She denied loss of smell, REM sleep disorder, has anxiety symptoms, denied family history of tremor  Over the past few years, we have tried primadone, while taking 50mg  1/2 once a day , there was no significant improvement at that time, she also complains of headaches, but also tried diazepam, Ativan, without significant improvement  UPDATE June 12th 2015: She is taking primidone 50mg  bid am and pm, sometimes at noon, she did noticed increased bilateral hand shaking if she is not taking her primidone, she also noticed increased tremor in her left hand, unsteady gait, is taking aerobics class for balance training,  She has occasionally headaches, tramadol prn was helpful. She denies significant pain, no incontinence. She is very much concerned concerned about her neck pain, worsening gait difficulty,  REVIEW OF SYSTEMS: Full 14 system review of systems performed and notable only for tremor, dizziness, Dizziness, tremor  ALLERGIES: Allergies  Allergen Reactions  . Dextromethorphan Polistirex Er Other (See Comments)    SOB  . Primidone     Severe Headaches  . Albuterol      REACTION: tachycardia  . Asa [Aspirin]     Adult dose  . Clonazepam Hives  . Loratadine   . Simvastatin     Myalgias     HOME MEDICATIONS: Outpatient Prescriptions Prior to Visit  Medication Sig Dispense Refill  . aspirin 81 MG tablet Take 81 mg by mouth every other day.       . beclomethasone (QVAR) 80 MCG/ACT inhaler 2 puffs then rinse mouth, twice daily  1 Inhaler  prn  . budesonide-formoterol (SYMBICORT) 160-4.5 MCG/ACT inhaler 2 puffs then rinse mouth, twice daily  1 Inhaler  6  . budesonide-formoterol (SYMBICORT) 160-4.5 MCG/ACT inhaler Inhale 2 puffs into the lungs 2 (two) times daily.  1 Inhaler  6  . diazepam (VALIUM) 2 MG tablet Take 1 tablet (2 mg total) by mouth every 6 (six) hours as needed. For tremor  30 tablet  1  . fluticasone (FLONASE) 50 MCG/ACT nasal spray Place 2 sprays into both nostrils daily.      . hydrochlorothiazide (HYDRODIURIL) 25 MG tablet Take 0.5 tablets (12.5 mg total) by mouth daily.  45 tablet  3  . LOTEMAX 0.5 % GEL       . meclizine (ANTIVERT) 25 MG tablet 1 tablet daily.      . pantoprazole (PROTONIX) 40 MG tablet 1 tablet daily.      . primidone (MYSOLINE) 50 MG tablet Take 1 tablet (50 mg total) by mouth 3 (three) times daily.  90 tablet  6  . traMADol-acetaminophen (ULTRACET) 37.5-325 MG per tablet Take one as needed for headache.  30 tablet  5  . valsartan (DIOVAN) 80 MG tablet Take 80 mg by mouth daily.       No facility-administered medications prior  to visit.    PAST MEDICAL HISTORY: Past Medical History  Diagnosis Date  . Cancer     breast ca  . Hypertension   . Pulmonary sarcoidosis   . Anxiety   . Esophageal reflux   . Anemia   . 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 Dyslipidemia     myaligia with statins  . Tremor     upper extremities  . Chest pain     echo 01/08/10- nl lv; mild aortic sclerosis; myoview 01/08/10- no ischemia    PAST SURGICAL  HISTORY: Past Surgical History  Procedure Laterality Date  . Left breast lumpectomy,chemo  2004    xrt  . Skin biopsy      FAMILY HISTORY: No family history on file.  SOCIAL HISTORY: History   Social History  . Marital Status: Divorced    Spouse Name: N/A    Number of Children: 2  . 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 Smoking status: Never Smoker   . Smokeless tobacco: Never Used  . Alcohol Use: No  . Drug Use: No  . Sexual Activity: Not on file   Other Topics Concern  . Not on file   Social History Narrative   Patient lives at home alone. Patient is retired.Pateint has high school education. And went to [employer].   Right handed.   Caffeine- one cup daily.     PHYSICAL EXAM  Filed Vitals:   03/30/14 1137  BP: 135/75  Pulse: 104  Height: 5' 4.75" (1.645 m)  Weight: 139 lb (63.05 kg)   Body mass index is 23.3 kg/(m^2).  Generalized: Well developed, in no acute distress  Head: normocephalic and atraumatic,. Oropharynx benign  Neck: Supple, no carotid bruits  Cardiac: Regular rate rhythm, no murmur  Musculoskeletal: No deformity   Neurological examination   Mentation: Alert oriented to time, place, history taking. Follows all commands speech and language fluent  Cranial nerve II-XII: Pupils were equal round reactive to light extraocular movements were full, visual field were full on confrontational test. Facial sensation and strength were normal. hearing loss left ear. Uvula tongue midline. head turning and shoulder shrug and were normal and symmetric.Tongue protrusion into cheek strength was normal. She has occasional head No-No tremor. Motor: normal bulk and tone, full strength in bilateral upper and lower extremity muscles, she has bilateral upper extremity postural tremor, right more than left,   There was no significant rigidity, she also has head titubation,  anterocollis Sensory: normal and symmetric to light touch, pinprick, and  vibration  Coordination: finger-nose-finger, heel-to-shin bilaterally, no dysmetria Reflexes: Brachioradialis 2/2, biceps 2/2, triceps 2/2, patellar 2/2, Achilles 2/2, plantar responses were flexor bilaterally. Gait and Station: Rising up from seated position without assistance,  Leaning forward, mildly unsteady, wide based,stiff  ASSESSMENT AND PLAN  73 y.o. year old female with essential tremor, there was no parkinsonian features on examination  1. keep primidone  50 mg 3 times a day potential side effects was explained,, 2. propranolol 40 mg twice a day 3. she does has mild unsteady gait, anterocollis, MRI cervical to rule out cervical spondylitic myelopathy 4. Return to clinic in 3 months  Marcial Pacas, M.D. [facility] [address], [place], [place] [address] [phone]

## 2014-04-09 ENCOUNTER — Other Ambulatory Visit: Payer: Self-pay

## 2014-04-09 DIAGNOSIS — Z1231 Encounter for screening mammogram for malignant neoplasm of breast: Secondary | ICD-10-CM

## 2014-04-10 ENCOUNTER — Telehealth: Payer: Self-pay | Admitting: *Deleted

## 2014-04-10 NOTE — Telephone Encounter (Signed)
Called pt to inform her per Dr. Krista Blue OV note on 03/30/14, that Dr. Krista Blue wanted to rule out Cervical Spondylitic Myelopathy. I explained to the pt that Cervical spondylotic myelopathy is the most common cause of spinal cord dysfunction in older persons. The aging process results in degenerative changes in the cervical spine that, in advanced stages, can cause compression of the spinal cord. I advised the pt that if she has any other problems, questions or concerns to call the office. Pt verbalized understanding.

## 2014-04-13 ENCOUNTER — Inpatient Hospital Stay: Admission: RE | Admit: 2014-04-13 | Payer: Medicare Other | Source: Ambulatory Visit

## 2014-04-25 ENCOUNTER — Telehealth: Payer: Self-pay | Admitting: Cardiovascular Disease

## 2014-04-25 NOTE — Telephone Encounter (Signed)
Closed encounter °

## 2014-05-10 ENCOUNTER — Ambulatory Visit (INDEPENDENT_AMBULATORY_CARE_PROVIDER_SITE_OTHER): Payer: Medicare Other | Admitting: Internal Medicine

## 2014-05-10 ENCOUNTER — Encounter: Payer: Self-pay | Admitting: Internal Medicine

## 2014-05-10 VITALS — BP 140/73 | HR 110 | Ht 65.5 in | Wt 137.0 lb

## 2014-05-10 DIAGNOSIS — D869 Sarcoidosis, unspecified: Secondary | ICD-10-CM

## 2014-05-10 DIAGNOSIS — J441 Chronic obstructive pulmonary disease with (acute) exacerbation: Secondary | ICD-10-CM

## 2014-05-10 MED ORDER — BUDESONIDE-FORMOTEROL FUMARATE 160-4.5 MCG/ACT IN AERO: 2.0000 | INHALATION_SPRAY | Freq: Two times a day (BID) | RESPIRATORY_TRACT | Status: DC

## 2014-05-10 MED ORDER — AZITHROMYCIN 250 MG PO TABS: ORAL_TABLET | ORAL | Status: DC

## 2014-05-10 MED ORDER — AEROCHAMBER MV MISC: Status: DC

## 2014-05-10 NOTE — Progress Notes (Signed)
07/23/11- 73 year old female never smoker followed for sarcoid, chronic bronchitis complicated by history of right breast cancer, tachycardia, GERD, anxiety, glaucoma.. Last here- 12/29/2010. She is here today with concern about an intermittent pain at the inferior aspect of her right scapula, noticed over the past month. It is increased by stress. She also feels tender across the lower left inferior costal margin and she notices occasional heartburn. She recently restarted exercise classes at the [facility]. She denies fever productive cough sore throat nodes or exertional chest pain. She recently had cardiology followup at [facility] where she was told heart problems might be from sarcoid. She doesn't understand anything specific about that.  09/03/11-  73 year old female never smoker followed for sarcoid, chronic bronchitis complicated by history of right breast cancer, tachycardia, GERD, anxiety, glaucoma.. She has felt well with no acute issues. She does comment on annoying tremor of her hands. Treated by neurology with primidone which caused headache and rapid heart rate. Cold air increased her cough a little bit. She does not think she has an infection and denies fever, sweats, swollen glands, pain or purulent discharge. ACE level -07/23/11- 61 which may indicate low grade activity despite her age. CXR- 07/23/2011-patchy density right middle lobe, suspected to be sarcoid by the radiologist. We discussed the fact that this is in the field of radiation therapy for right breast cancer and may be radiation change in the underlying lung.  12/04/11-  73 year old female never smoker followed for sarcoid, chronic bronchitis complicated by history of right breast cancer, tachycardia, GERD, anxiety, glaucoma.[name]  PCP Dr Dagmar Hait She keeps a mild cough which she feels in the left upper chest, productive of small amounts of white sputum with no pain fever or blood. Lites night sweats especially  if she is before bedtime.  Cough syrup causes "funny breathing", without exertional dyspnea. She does aerobics at the [employer]. With exercise she gets an ache in the left upper chest which will persist for about a day. Continues Timoptic eyedrops for glaucoma.  01/01/12- 73 year old female never smoker followed for sarcoid, chronic bronchitis complicated by history of right breast cancer, tachycardia, GERD, anxiety, glaucoma.[name]  PCP Dr Dagmar Hait CXR 12/08/11- images reviewed with her Comparison: Chest x-ray of 07/23/2011  Findings: Vague opacity in the right mid lung and hilar and  mediastinal adenopathy is stable in this patient with a history of  sarcoidosis. No definite active process is seen. Heart size is  stable. No bony abnormality is noted.  IMPRESSION:  Stable hilar - medial and adenopathy with the opacity in the right  mid lung in this patient with sarcoidosis. No active process.  Original Report Authenticated By: Joretta Bachelor, M.D.  Still coughs, white phlegm or dry. Primidone was changed to diastolic for tremor without increased chest tightness or wheeze. Continues timolol for glaucoma. PFT-12/21/11- normal spirometry with insignificant response to bronchodilator, normal lung volumes, diffusion at lower limits of normal. 6 minute walk test-96%, 96%, 99%, 324 m. Excellent oxygenation with no desaturation during this exercise.  02/09/12- 73 year old female never smoker followed for sarcoid, chronic bronchitis complicated by history of right breast cancer, tachycardia, GERD, anxiety, glaucoma.[name]  PCP Dr Dagmar Hait No longer feels she needs Qvar inhaler. No longer needs benzonatate Perles. Not much cough. She did not need to ask her eye doctor about Timolol eyedrops because she was not coughing. Asks about a tender point at the left lower costal margin Last ACE level 12/04/11- 5(L)  12/19/12- 73 year old female never smoker followed  for sarcoid, chronic bronchitis complicated by history of right  breast cancer, tachycardia, GERD, anxiety, glaucoma.[name]  PCP Dr Dagmar Hait ACUTE VISIT: recently dx'd by  Dr. Dagmar Hait with PNA; wanted to be rechecked by CY and at the request of Dr. Dagmar Hait. His CXR report not yet available.  She had incr cough with white sputum, no fever starting 2 weeks ago. Finished 7 days of levaquin 5 days ago. Now little dry cough. Using Qvar- had been wheezing. Feels washed out. Bilateral lower rib ache from cough. CXR 12/08/11 IMPRESSION:  Stable hilar - medial and adenopathy with the opacity in the right  mid lung in this patient with sarcoidosis. No active process.  Original Report Authenticated By: Joretta Bachelor, M.D.  03/21/13- 73 year old female never smoker followed for sarcoid, chronic bronchitis complicated by history of right breast cancer/ XRT, tachycardia, GERD, anxiety, glaucoma.[name]  PCP Dr Dagmar Hait Husband just died of lung cancer She had Valium but dislikes the rebound and asks if I could give her something for anxiety for a few days. Breathing has been stable with some cough, no sputum, no fever. CXR 12/21/12 IMPRESSION:  No active disease. Stable hilar adenopathy. Again noted post  radiation changes in right upper lobe.  Original Report Authenticated By: Lahoma Crocker, M.D.  06/21/13- 73 year old female never smoker followed for sarcoid, chronic bronchitis complicated by history of right breast cancer/ XRT, tachycardia, GERD, anxiety, glaucoma.[name]  PCP Dr Dagmar Hait  FOLLOWS FOR:  Dry cough. Denies wheezing or SOB otherwise unchanged since last OV Denies change in her dry cough without significant shortness of breath.  Valium did not help her tremor-has a neurology appointment. New complaint of diffuse itching especially upper chest and hands without rash. Tried Aquaphor. Wants to delay flu vaccine.  11/10/13- 73 year old female never smoker followed for sarcoid, chronic bronchitis complicated by history of right breast cancer/ XRT, tachycardia, GERD, anxiety, glaucoma, tremor.[name]  PCP Dr  Dagmar Hait ACUTE VISIT: has been having chest pain x 2 weeks and breathing hard. Runny nose and eyes as well. Seen by PCP-EKG neg. Patient describes aching pain left anterior chest wall, originally intermittent and now constant. PCP did swab negative for flu and recommended Tylenol. She took tramadol for headache, and says that's when the chest started hurting. Tremor is bothering her more. Neurologist in November restarted primidone which has not helped, although she admits not following directions. She does not plan to followup with that neurologist.  01/08/14- 73 year old female never smoker followed for sarcoid, chronic bronchitis complicated by history of right breast cancer/ XRT, tachycardia, GERD, anxiety, glaucoma, tremor.[name]  PCP Dr Dagmar Hait FOLLOWS FOR:  Breathing doing well, some wheezing-Began Breo Ellipta-reports does not like. CXR 11/10/13- IMPRESSION:  1. There is hyperinflation consistent with COPD. Stable increased  interstitial markings in the right perihilar region may be related  to the patient's sarcoidosis for reflect scarring.  2. There is stable enlargement of hilar lymph nodes bilaterally.  There is no evidence of CHF nor acute alveolar pneumonia.  3. Chest CT scanning may be useful if the patient's symptoms  persist.  Electronically Signed  By: David Martinique  On: 11/10/2013 11:36  01/25/14- 73 year old female never smoker followed for sarcoid, chronic bronchitis complicated by history of right breast cancer/ XRT, tachycardia, GERD, anxiety, glaucoma, tremor.[name]  PCP Dr Dagmar Hait ACUTE VISIT: continues to cough-not any better per patient. Completed the Zpak and phlegm is not colored now.  Denies any wheezing or SOB. Having dark spots in roof of mouth Did not like Breo.  05/10/14- 73 year old female never smoker followed for sarcoid, chronic bronchitis complicated by history of right breast cancer/ XRT, tachycardia, GERD, anxiety, glaucoma, tremor.[name]  PCP Dr Dagmar Hait FOLLOWS FOR:  Reports some sob  and cough with white mucus Eyes water, occasional cough. She thinks it is because of increased use of air-conditioning. Does not have a cold. Couldn't operate her Symbicort inhaler due to tremor. Qvar is easier  ROS- see HPI Constitutional:   No-   weight loss, night sweats, fevers, chills,+ fatigue, lassitude. HEENT:   No-  headaches, difficulty swallowing, tooth/dental problems, sore throat,       No-  sneezing, itching, ear ache, nasal congestion, post nasal drip,  CV:  + chest pain, orthopnea, PND, swelling in lower extremities, anasarca, dizziness, palpitations Resp: No-   shortness of breath with exertion or at rest.             No-  productive cough,  + non-productive cough,  No-  coughing up of blood.              No-   change in color of mucus.  No- wheezing.   Skin: no-pruritus or rash  GI:  No-   heartburn, indigestion, abdominal pain, nausea, vomiting,  GU:. MS:  No-   joint pain or swelling.  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 No depression or anxiety.  No memory loss.  Obj General- Alert, Oriented, Affect-appropriate, Distress- none acute Skin- rash-none, lesions- none, excoriation- none Lymphadenopathy- none Head- atraumatic            Eyes- Gross vision intact, PERRLA, conjunctivae clear secretions            Ears- Hearing, canals-normal            Nose- Clear, no-Septal dev, mucus, polyps, erosion, perforation             Throat- Mallampati II , mucosa clear , drainage- none, tonsils- atrophic. Missing teeth Neck- flexible , trachea midline, no stridor , thyroid nl, carotid no bruit Chest - symmetrical excursion , unlabored           Heart/CV- RRR with no extra beats , no murmur , no gallop  , no rub, nl s1 s2                           - JVD- none , edema- none, stasis changes- none, varices- none           Lung- + bilateral unlabored squeaks,  Work of breathing is not increased. cough- none ,                      dullness-none, rub- none           Chest wall- treatment Scar R breast, L upper anterior chest Abd- No HSM Br/ Gen/ Rectal- Not done, not indicated Extrem- cyanosis- none, clubbing, none, atrophy- none, strength- nl Neuro- +Tremor .

## 2014-05-10 NOTE — Patient Instructions (Addendum)
Script for Z pak sent  Script for Aerochamber spacer to use with your Symbicort sample   Inhale a puff twice through the spacer, then rinse mouth. Repeat twice daily.

## 2014-05-21 ENCOUNTER — Ambulatory Visit
Admission: RE | Admit: 2014-05-21 | Discharge: 2014-05-21 | Disposition: A | Discharge status: Home / Self Care | Payer: Medicare Other | Source: Ambulatory Visit

## 2014-05-21 DIAGNOSIS — Z1231 Encounter for screening mammogram for malignant neoplasm of breast: Secondary | ICD-10-CM

## 2014-06-08 ENCOUNTER — Encounter: Payer: Self-pay | Admitting: Cardiovascular Disease

## 2014-06-08 ENCOUNTER — Ambulatory Visit (INDEPENDENT_AMBULATORY_CARE_PROVIDER_SITE_OTHER): Payer: Medicare Other | Admitting: Cardiovascular Disease

## 2014-06-08 VITALS — BP 144/76 | HR 90 | Resp 16 | Ht 64.0 in | Wt 131.6 lb

## 2014-06-08 DIAGNOSIS — I471 Supraventricular tachycardia: Secondary | ICD-10-CM

## 2014-06-08 DIAGNOSIS — I447 Left bundle-branch block, unspecified: Secondary | ICD-10-CM

## 2014-06-08 NOTE — Progress Notes (Signed)
Patient ID: Danielle Peters, female   DOB: 09/01/1941, 73 y.o.   MRN: [id number]     Reason for office visit HTN, history of SVT  Danielle Peters has no cardiac complaints today. She is quite emotional about her persistent problems with tremor, that is not improving despite treatment with primidone. This medicine is making her feel constantly dizzy and she doesn't think it's helping her tremor. She complains of pain in her left upper chest and left arm that has cleared musculoskeletal characteristics. It is not associated with physical activity, but rather with motion. Her blood pressure has been well-controlled. He was a little high when she first checked in today to 100 rechecked it was only 120/73 mm Hg. She has not had syncope, palpitations, lower extremity edema or any problems with shortness of breath.  Her electrocardiogram today shows a new left bundle branch block. She bears a diagnosis of pulmonary sarcoidosis but this has been quiescent. She had ablation for AV node reentry tachycardia in 2010 without recurrent arrhythmia. She has a history of left breast lumpectomy and chemotherapy for breast cancer.   Allergies  Allergen Reactions  . Dextromethorphan Polistirex Er Other (See Comments)    SOB  . Primidone     Severe Headaches  . Albuterol     REACTION: tachycardia  . Asa [Aspirin]     Adult dose  . Clonazepam Hives  . Loratadine   . Simvastatin     Myalgias     Current Outpatient Prescriptions  Medication Sig Dispense Refill  . aspirin 81 MG tablet Take 81 mg by mouth every other day.       . budesonide-formoterol (SYMBICORT) 160-4.5 MCG/ACT inhaler 2 puffs then rinse mouth, twice daily  1 Inhaler  6  . budesonide-formoterol (SYMBICORT) 160-4.5 MCG/ACT inhaler Inhale 2 puffs into the lungs 2 (two) times daily.  1 Inhaler  0  . diazepam (VALIUM) 2 MG tablet Take 1 tablet (2 mg total) by mouth every 6 (six) hours as needed. For tremor  30 tablet  1  . fluticasone (FLONASE)  50 MCG/ACT nasal spray Place 2 sprays into both nostrils as needed.       . hydrochlorothiazide (HYDRODIURIL) 25 MG tablet Take 0.5 tablets (12.5 mg total) by mouth daily.  45 tablet  3  . lansoprazole (PREVACID) 30 MG capsule       . LOTEMAX 0.5 % GEL       . meclizine (ANTIVERT) 25 MG tablet 1 tablet daily.      . primidone (MYSOLINE) 50 MG tablet Take 1 tablet (50 mg total) by mouth 3 (three) times daily.  90 tablet  6  . traMADol-acetaminophen (ULTRACET) 37.5-325 MG per tablet Take one as needed for headache.  30 tablet  5  . valsartan (DIOVAN) 80 MG tablet Take 80 mg by mouth daily.       No current facility-administered medications for this visit.    Past Medical History  Diagnosis Date  . Cancer     breast ca  . Hypertension   . Pulmonary sarcoidosis   . Anxiety   . Esophageal reflux   . Anemia   . 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 Dyslipidemia     myaligia with statins  . Tremor     upper extremities  . Chest pain     echo 01/08/10- nl lv; mild aortic sclerosis; myoview 01/08/10- no ischemia  Past Surgical History  Procedure Laterality Date  . Left breast lumpectomy,chemo  2004    xrt  . Skin biopsy      No family history on file.  History   Social History  . Marital Status: Divorced    Spouse Name: N/A    Number of Children: 2  . 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 Smoking status: Never Smoker   . Smokeless tobacco: Never Used  . Alcohol Use: No  . Drug Use: No  . Sexual Activity: Not on file   Other Topics Concern  . Not on file   Social History Narrative   Patient lives at home alone. Patient is retired.Pateint has high school education. And went to [employer].   Right handed.   Caffeine- one cup daily.    Review of systems: The patient specifically denies any chest pain on exertion, dyspnea  at rest or with exertion, orthopnea, paroxysmal nocturnal dyspnea, syncope, palpitations, focal neurological deficits, intermittent claudication, lower extremity edema, unexplained weight gain, cough, hemoptysis or wheezing.  The patient also denies abdominal pain, nausea, vomiting, dysphagia, diarrhea, constipation, polyuria, polydipsia, dysuria, hematuria, frequency, urgency, abnormal bleeding or bruising, fever, chills, unexpected weight changes, mood swings, change in skin or hair texture, change in voice quality, auditory or visual problems, allergic reactions or rashes, new musculoskeletal complaints other than usual "aches and pains".   PHYSICAL EXAM BP 144/76  Pulse 90  Resp 16  Ht _0  (1.626 m)  Wt 131 lb 9.6 oz (59.693 kg)  BMI 22.58 kg/m2 General: Alert, oriented x3, no distress  Head: no evidence of trauma, PERRL, EOMI, no exophtalmos or lid lag, no myxedema, no xanthelasma; normal ears, nose and oropharynx  Neck: normal jugular venous pulsations and no hepatojugular reflux; brisk carotid pulses without delay and no carotid bruits  Chest: clear to auscultation, no signs of consolidation by percussion or palpation, normal fremitus, symmetrical and full respiratory excursions  Cardiovascular: normal position and quality of the apical impulse, regular rhythm, normal first and second heart sounds, no murmurs, rubs or gallops  Abdomen: no tenderness or distention, no masses by palpation, no abnormal pulsatility or arterial bruits, normal bowel sounds, no hepatosplenomegaly  Extremities: no clubbing, cyanosis or edema; 2+ radial, ulnar and brachial pulses bilaterally; 2+ right femoral, posterior tibial and dorsalis pedis pulses; 2+ left femoral, posterior tibial and dorsalis pedis pulses; no subclavian or femoral bruits  Neurological: grossly nonfocal except prominent symmetrical tremor of both hands both at rest and intentional   EKG: Sinus rhythm, new left bundle branch block  She  had a normal echocardiogram in 2011  BMET    Component Value Date/Time   NA 136 01/27/2011 1554   K 3.7 01/27/2011 1554   CL 101 01/27/2011 1554   CO2 31 05/05/2010 1438   GLUCOSE 115* 01/27/2011 1554   BUN 15 01/27/2011 1554   CREATININE 1.2 01/27/2011 1554   CALCIUM 10.0 05/05/2010 1438   GFRNONAA 59* 05/05/2010 1438   GFRAA  Value: >60        The eGFR has been calculated using the MDRD equation. This calculation has not been validated in all clinical situations. eGFR's persistently <60 mL/min signify possible Chronic Kidney Disease. 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 She does have a diagnosis  of sarcoidosis, but her pulmonary disease has been quiet since 4 years and there is no other indication that she could have myocardial involvement. In the absence of symptoms of cardiac dysfunction, I don't think new evaluation is indicated. She does not have any symptoms suggestive of high-grade AV block. Nevertheless, it is probably wise to avoid negative chronotropic agents from this point forward.  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   Holli Humbles, MD, [place] [phone] office [phone] pager

## 2014-06-08 NOTE — Patient Instructions (Signed)
Your physician recommends that you schedule a follow-up appointment in: One year with Dr. Croitoru.  

## 2014-06-09 DIAGNOSIS — I447 Left bundle-branch block, unspecified: Secondary | ICD-10-CM | POA: Insufficient documentation

## 2014-06-09 NOTE — Assessment & Plan Note (Signed)
This is a newly detected abnormality, most likely secondary to age-related conduction system disease. She does have a diagnosis of sarcoidosis, but her pulmonary disease has been quiet since 4 years and there is no other indication that she could have myocardial involvement. In the absence of symptoms of cardiac dysfunction, I don't think new evaluation is indicated. She does not have any symptoms suggestive of high-grade AV block. Nevertheless, it is probably wise to avoid negative chronotropic agents from this point forward.

## 2014-06-09 NOTE — Assessment & Plan Note (Signed)
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

## 2014-06-11 ENCOUNTER — Ambulatory Visit (INDEPENDENT_AMBULATORY_CARE_PROVIDER_SITE_OTHER): Payer: Medicare Other | Admitting: Podiatry

## 2014-06-11 DIAGNOSIS — M79673 Pain in unspecified foot: Secondary | ICD-10-CM

## 2014-06-11 DIAGNOSIS — M79609 Pain in unspecified limb: Secondary | ICD-10-CM

## 2014-06-11 DIAGNOSIS — B351 Tinea unguium: Secondary | ICD-10-CM

## 2014-06-11 DIAGNOSIS — L84 Corns and callosities: Secondary | ICD-10-CM

## 2014-06-11 NOTE — Progress Notes (Signed)
   Subjective:    Patient ID: Danielle Peters, female    DOB: Nov 13, 1940, 73 y.o.   MRN: [id number]  HPI  Pt presents for nail debridement and callus trim left heel and bilateral big toe  Review of Systems     Objective:   Physical Exam        Assessment & Plan:

## 2014-06-12 NOTE — Progress Notes (Signed)
Subjective:     Patient ID: Danielle Peters, female   DOB: 18-May-1941, 73 y.o.   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

## 2014-07-02 ENCOUNTER — Ambulatory Visit (INDEPENDENT_AMBULATORY_CARE_PROVIDER_SITE_OTHER): Payer: Medicare Other | Admitting: Neurology

## 2014-07-02 ENCOUNTER — Encounter: Payer: Self-pay | Admitting: Neurology

## 2014-07-02 VITALS — BP 144/80 | HR 72 | Ht 63.0 in | Wt 140.0 lb

## 2014-07-02 DIAGNOSIS — G252 Other specified forms of tremor: Principal | ICD-10-CM

## 2014-07-02 DIAGNOSIS — I10 Essential (primary) hypertension: Secondary | ICD-10-CM

## 2014-07-02 DIAGNOSIS — G250 Essential tremor: Secondary | ICD-10-CM

## 2014-07-02 DIAGNOSIS — R202 Paresthesia of skin: Secondary | ICD-10-CM

## 2014-07-02 DIAGNOSIS — R209 Unspecified disturbances of skin sensation: Secondary | ICD-10-CM

## 2014-07-02 MED ORDER — CLONAZEPAM 0.5 MG PO TABS: 0.5000 mg | ORAL_TABLET | Freq: Two times a day (BID) | ORAL | Status: DC | PRN

## 2014-07-02 MED ORDER — SERTRALINE HCL 50 MG PO TABS: 50.0000 mg | ORAL_TABLET | Freq: Every day | ORAL | Status: DC

## 2014-07-02 NOTE — Patient Instructions (Signed)
Tapering off primidone, you are taking 50mg  three times a day.  You should  1st week, one tab twice a day 2nd week, just once evening 3rd week, stop

## 2014-07-02 NOTE — Progress Notes (Signed)
[facility]  HISTORY OF PRESENT ILLNESS: Mrs. 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  She presenting with few years history of bilateral hands tremor, getting worse when she stretched out her hands, or using utensils, she has describe difficulty feeding herself, cause a lot of social embarrasement, she denies significant gait difficulty, she also has head titubation  She denied bilateral lower exremity paresthesia weakness, but does complain fingertips numbness tingling.  She denied loss of smell, REM sleep disorder, has anxiety symptoms, denied family history of tremor  Over the past few years, we have tried primadone, while taking 50mg  1/2 once a day , there was no significant improvement at that time, she also complains of headaches, but also tried diazepam, Ativan, without significant improvement  UPDATE June 12th 2015: She is taking primidone 50mg  bid am and pm, sometimes at noon, she did noticed increased bilateral hand shaking if she is not taking her primidone, she also noticed increased tremor in her left hand, unsteady gait, is taking aerobics class for balance training,  She has occasionally headaches, tramadol prn was helpful. She denies significant pain, no incontinence. She is very much concerned concerned about her neck pain, worsening gait difficulty,   UPDATE Sept 14th 2015: She has tried increased primidone to 50mg  tid, but complains of significant side effect, no help with her tremor, complains of drowsiness, increased anxiety, she did not try inderal 40mg  bid, worry about the side effect.  She also complains of anxiety,  difficulty holding forks in her hand, decreased appetite, weight loss  She is no longer taking Valium, which was prescribed by her primary care before.  REVIEW OF SYSTEMS: Full 14 system review of systems performed and notable only for tremor, dizziness, anxiety   ALLERGIES: Allergies  Allergen Reactions  . Dextromethorphan Polistirex Er Other (See Comments)    SOB  . Primidone     Severe Headaches  . Albuterol     REACTION: tachycardia  . Asa [Aspirin]     Adult dose  . Clonazepam Hives  . Loratadine   . Simvastatin     Myalgias     HOME MEDICATIONS: Outpatient Prescriptions Prior to Visit  Medication Sig Dispense Refill  . aspirin 81 MG tablet Take 81 mg by mouth every other day.       . budesonide-formoterol (SYMBICORT) 160-4.5 MCG/ACT inhaler 2 puffs then rinse mouth, twice daily  1 Inhaler  6  . budesonide-formoterol (SYMBICORT) 160-4.5 MCG/ACT inhaler Inhale 2 puffs into the lungs 2 (two) times daily.  1 Inhaler  0  . diazepam (VALIUM) 2 MG tablet Take 1 tablet (2 mg total) by mouth every 6 (six) hours as needed. For tremor  30 tablet  1  . fluticasone (FLONASE) 50 MCG/ACT nasal spray Place 2 sprays into both nostrils as needed.       . hydrochlorothiazide (HYDRODIURIL) 25 MG tablet Take 0.5 tablets (12.5 mg total) by mouth daily.  45 tablet  3  . lansoprazole (PREVACID) 30 MG capsule       . LOTEMAX 0.5 % GEL       . meclizine (ANTIVERT) 25 MG tablet 1 tablet daily.      . primidone (MYSOLINE) 50 MG tablet Take 1 tablet (50 mg  total) by mouth 3 (three) times daily.  90 tablet  6  . traMADol-acetaminophen (ULTRACET) 37.5-325 MG per tablet Take one as needed for headache.  30 tablet  5  . valsartan (DIOVAN) 80 MG tablet Take 80 mg by mouth daily.       No facility-administered medications prior to visit.    PAST MEDICAL HISTORY: Past Medical History  Diagnosis Date  . Cancer     breast ca  . Hypertension   . Pulmonary sarcoidosis   . Anxiety   . Esophageal  reflux   . Anemia   . 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 Dyslipidemia     myaligia with statins  . Tremor     upper extremities  . Chest pain     echo 01/08/10- nl lv; mild aortic sclerosis; myoview 01/08/10- no ischemia    PAST SURGICAL HISTORY: Past Surgical History  Procedure Laterality Date  . Left breast lumpectomy,chemo  2004    xrt  . Skin biopsy      FAMILY HISTORY: No family history on file.  SOCIAL HISTORY: History   Social History  . Marital Status: Divorced    Spouse Name: N/A    Number of Children: 2  . 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 Smoking status: Never Smoker   . Smokeless tobacco: Never Used  . Alcohol Use: No  . Drug Use: No  . Sexual Activity: Not on file   Other Topics Concern  . Not on file   Social History Narrative   Patient lives at home alone. Patient is retired.Pateint has high school education. And went to [employer].   Right handed.   Caffeine- one cup daily.     PHYSICAL EXAM  There were no vitals filed for this visit. There is no weight on file to calculate BMI.  Generalized: Well developed, in no acute distress  Head: normocephalic and atraumatic,. Oropharynx benign  Neck: Supple, no carotid bruits  Cardiac: Regular rate rhythm, no murmur  Musculoskeletal: No deformity   Neurological examination   Mentation: Alert oriented to time, place, history taking. Follows all commands speech and language fluent  Cranial nerve II-XII: Pupils were equal round reactive to light extraocular movements were full, visual field were full on confrontational test. Facial sensation and strength were normal. hearing loss left ear. Uvula tongue midline. head turning and shoulder shrug and were normal and symmetric.Tongue protrusion into cheek strength was normal. She has occasional head No-No  tremor. Motor: normal bulk and tone, full strength in bilateral upper and lower extremity muscles, she has bilateral upper extremity postural tremor, right more than left,   There was no significant rigidity, she also has head titubation, anterocollis Sensory: normal and symmetric to light touch, pinprick, and  vibration  Coordination: finger-nose-finger, heel-to-shin bilaterally, no dysmetria Reflexes: Brachioradialis 2/2, biceps 2/2, triceps 2/2, patellar 2/2, Achilles 2/2, plantar responses were flexor bilaterally. Gait and Station: Rising up from seated position without assistance,  Leaning forward, mildly unsteady, wide based,stiff  ASSESSMENT AND PLAN  73 y.o. year old female with essential tremor, there was no parkinsonian features on examination  1. Tapering off primidone, she could not tolerated, significant side effect 2. She also complains of significant anxiety, will try Zoloft 50 mg daily, clonazepam 0. 5 mg twice a day 3. Return to clinic  in 3 months with nurse practitioner  Marcial Pacas, M.D. [facility] [address], [place], [place] [id number] [phone]

## 2014-08-09 ENCOUNTER — Encounter: Payer: Self-pay | Admitting: Internal Medicine

## 2014-08-09 ENCOUNTER — Ambulatory Visit (INDEPENDENT_AMBULATORY_CARE_PROVIDER_SITE_OTHER): Payer: Medicare Other | Admitting: Internal Medicine

## 2014-08-09 VITALS — BP 128/64 | HR 98 | Ht 63.5 in | Wt 136.8 lb

## 2014-08-09 DIAGNOSIS — G252 Other specified forms of tremor: Secondary | ICD-10-CM

## 2014-08-09 DIAGNOSIS — D869 Sarcoidosis, unspecified: Secondary | ICD-10-CM

## 2014-08-09 DIAGNOSIS — J441 Chronic obstructive pulmonary disease with (acute) exacerbation: Secondary | ICD-10-CM

## 2014-08-09 DIAGNOSIS — G250 Essential tremor: Secondary | ICD-10-CM

## 2014-08-09 DIAGNOSIS — R251 Tremor, unspecified: Secondary | ICD-10-CM

## 2014-08-09 MED ORDER — LEVALBUTEROL HCL 0.63 MG/3ML IN NEBU
0.6300 mg | INHALATION_SOLUTION | Freq: Once | RESPIRATORY_TRACT | Status: AC
  Administered 2014-08-09: 0.63 mg via RESPIRATORY_TRACT

## 2014-08-09 MED ORDER — METHYLPREDNISOLONE ACETATE 80 MG/ML IJ SUSP
80.0000 mg | Freq: Once | INTRAMUSCULAR | Status: AC
  Administered 2014-08-09: 80 mg via INTRAMUSCULAR

## 2014-08-09 NOTE — Patient Instructions (Signed)
Neb Xop 0.63  Depo 80  Instruct how to use Incruse 1 puff daily instead of Symbicort. Let us know how you are doing on the new inhaler.   Keep September 10, 2014 with Dr Annamaria Boots.

## 2014-08-10 ENCOUNTER — Encounter: Payer: Self-pay | Admitting: Internal Medicine

## 2014-08-10 NOTE — Progress Notes (Signed)
07/23/11- 73 year old female never smoker followed for sarcoid, chronic bronchitis complicated by history of right breast cancer, tachycardia, GERD, anxiety, glaucoma.. Last here- 12/29/2010. She is here today with concern about an intermittent pain at the inferior aspect of her right scapula, noticed over the past month. It is increased by stress. She also feels tender across the lower left inferior costal margin and she notices occasional heartburn. She recently restarted exercise classes at the [facility]. She denies fever productive cough sore throat nodes or exertional chest pain. She recently had cardiology followup at [facility] where she was told heart problems might be from sarcoid. She doesn't understand anything specific about that.  09/03/11-  73 year old female never smoker followed for sarcoid, chronic bronchitis complicated by history of right breast cancer, tachycardia, GERD, anxiety, glaucoma.. She has felt well with no acute issues. She does comment on annoying tremor of her hands. Treated by neurology with primidone which caused headache and rapid heart rate. Cold air increased her cough a little bit. She does not think she has an infection and denies fever, sweats, swollen glands, pain or purulent discharge. ACE level -07/23/11- 61 which may indicate low grade activity despite her age. CXR- 07/23/2011-patchy density right middle lobe, suspected to be sarcoid by the radiologist. We discussed the fact that this is in the field of radiation therapy for right breast cancer and may be radiation change in the underlying lung.  12/04/11-  73 year old female never smoker followed for sarcoid, chronic bronchitis complicated by history of right breast cancer, tachycardia, GERD, anxiety, glaucoma.[name]  PCP Dr Dagmar Hait She keeps a mild cough which she feels in the left upper chest, productive of small amounts of white sputum with no pain fever or blood. Lites night sweats especially  if she is before bedtime.  Cough syrup causes "funny breathing", without exertional dyspnea. She does aerobics at the [employer]. With exercise she gets an ache in the left upper chest which will persist for about a day. Continues Timoptic eyedrops for glaucoma.  01/01/12- 73 year old female never smoker followed for sarcoid, chronic bronchitis complicated by history of right breast cancer, tachycardia, GERD, anxiety, glaucoma.[name]  PCP Dr Dagmar Hait CXR 12/08/11- images reviewed with her Comparison: Chest x-ray of 07/23/2011  Findings: Vague opacity in the right mid lung and hilar and  mediastinal adenopathy is stable in this patient with a history of  sarcoidosis. No definite active process is seen. Heart size is  stable. No bony abnormality is noted.  IMPRESSION:  Stable hilar - medial and adenopathy with the opacity in the right  mid lung in this patient with sarcoidosis. No active process.  Original Report Authenticated By: Joretta Bachelor, M.D.  Still coughs, white phlegm or dry. Primidone was changed to diastolic for tremor without increased chest tightness or wheeze. Continues timolol for glaucoma. PFT-12/21/11- normal spirometry with insignificant response to bronchodilator, normal lung volumes, diffusion at lower limits of normal. 6 minute walk test-96%, 96%, 99%, 324 m. Excellent oxygenation with no desaturation during this exercise.  02/09/12- 73 year old female never smoker followed for sarcoid, chronic bronchitis complicated by history of right breast cancer, tachycardia, GERD, anxiety, glaucoma.[name]  PCP Dr Dagmar Hait No longer feels she needs Qvar inhaler. No longer needs benzonatate Perles. Not much cough. She did not need to ask her eye doctor about Timolol eyedrops because she was not coughing. Asks about a tender point at the left lower costal margin Last ACE level 12/04/11- 5(L)  12/19/12- 73 year old female never smoker followed  for sarcoid, chronic bronchitis complicated by history of right  breast cancer, tachycardia, GERD, anxiety, glaucoma.[name]  PCP Dr Dagmar Hait ACUTE VISIT: recently dx'd by  Dr. Dagmar Hait with PNA; wanted to be rechecked by CY and at the request of Dr. Dagmar Hait. His CXR report not yet available.  She had incr cough with white sputum, no fever starting 2 weeks ago. Finished 7 days of levaquin 5 days ago. Now little dry cough. Using Qvar- had been wheezing. Feels washed out. Bilateral lower rib ache from cough. CXR 12/08/11 IMPRESSION:  Stable hilar - medial and adenopathy with the opacity in the right  mid lung in this patient with sarcoidosis. No active process.  Original Report Authenticated By: Joretta Bachelor, M.D.  03/21/13- 73 year old female never smoker followed for sarcoid, chronic bronchitis complicated by history of right breast cancer/ XRT, tachycardia, GERD, anxiety, glaucoma.[name]  PCP Dr Dagmar Hait Husband just died of lung cancer She had Valium but dislikes the rebound and asks if I could give her something for anxiety for a few days. Breathing has been stable with some cough, no sputum, no fever. CXR 12/21/12 IMPRESSION:  No active disease. Stable hilar adenopathy. Again noted post  radiation changes in right upper lobe.  Original Report Authenticated By: Lahoma Crocker, M.D.  06/21/13- 73 year old female never smoker followed for sarcoid, chronic bronchitis complicated by history of right breast cancer/ XRT, tachycardia, GERD, anxiety, glaucoma.[name]  PCP Dr Dagmar Hait  FOLLOWS FOR:  Dry cough. Denies wheezing or SOB otherwise unchanged since last OV Denies change in her dry cough without significant shortness of breath.  Valium did not help her tremor-has a neurology appointment. New complaint of diffuse itching especially upper chest and hands without rash. Tried Aquaphor. Wants to delay flu vaccine.  11/10/13- 73 year old female never smoker followed for sarcoid, chronic bronchitis complicated by history of right breast cancer/ XRT, tachycardia, GERD, anxiety, glaucoma, tremor.[name]  PCP Dr  Dagmar Hait ACUTE VISIT: has been having chest pain x 2 weeks and breathing hard. Runny nose and eyes as well. Seen by PCP-EKG neg. Patient describes aching pain left anterior chest wall, originally intermittent and now constant. PCP did swab negative for flu and recommended Tylenol. She took tramadol for headache, and says that's when the chest started hurting. Tremor is bothering her more. Neurologist in November restarted primidone which has not helped, although she admits not following directions. She does not plan to followup with that neurologist.  01/08/14- 73 year old female never smoker followed for sarcoid, chronic bronchitis complicated by history of right breast cancer/ XRT, tachycardia, GERD, anxiety, glaucoma, tremor.[name]  PCP Dr Dagmar Hait FOLLOWS FOR:  Breathing doing well, some wheezing-Began Breo Ellipta-reports does not like. CXR 11/10/13- IMPRESSION:  1. There is hyperinflation consistent with COPD. Stable increased  interstitial markings in the right perihilar region may be related  to the patient's sarcoidosis for reflect scarring.  2. There is stable enlargement of hilar lymph nodes bilaterally.  There is no evidence of CHF nor acute alveolar pneumonia.  3. Chest CT scanning may be useful if the patient's symptoms  persist.  Electronically Signed  By: David Martinique  On: 11/10/2013 11:36  01/25/14- 73 year old female never smoker followed for sarcoid, chronic bronchitis complicated by history of right breast cancer/ XRT, tachycardia, GERD, anxiety, glaucoma, tremor.[name]  PCP Dr Dagmar Hait ACUTE VISIT: continues to cough-not any better per patient. Completed the Zpak and phlegm is not colored now.  Denies any wheezing or SOB. Having dark spots in roof of mouth Did not like Breo.  05/10/14- 73 year old female never smoker followed for sarcoid, chronic bronchitis complicated by history of right breast cancer/ XRT, tachycardia, GERD, anxiety, glaucoma, tremor.[name]  PCP Dr Dagmar Hait FOLLOWS FOR:  Reports some sob  and cough with white mucus   08/09/14- 73 year old female never smoker followed for sarcoid, chronic bronchitis complicated by history of right breast cancer/ XRT, tachycardia, GERD, anxiety, glaucoma, tremor.[name]  PCP Dr Dagmar Hait FOLLOW FOR:  Sarcoidosis; Coughing up thick white mucus, chest pain Constant ache across upper anterior chest x 2-3 weeks. Symbicort gives sore throat and jitters. No fever, blood, exertional chest pain or radiation.  ROS- see HPI Constitutional:   No-   weight loss, night sweats, fevers, chills,+ fatigue, lassitude. HEENT:   No-  headaches, difficulty swallowing, tooth/dental problems, sore throat,       No-  sneezing, itching, ear ache, nasal congestion, post nasal drip,  CV:  + chest pain, orthopnea, PND, swelling in lower extremities, anasarca, dizziness, palpitations Resp: No-   shortness of breath with exertion or at rest.             + productive cough,  + non-productive cough,  No-  coughing up of blood.              No-   change in color of mucus.  No- wheezing.   Skin: no-pruritus or rash  GI:  No-   heartburn, indigestion, abdominal pain, nausea, vomiting,  GU:. MS:  No-   joint pain or swelling.  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 No depression or anxiety.  No memory loss.  Obj General- Alert, Oriented, Affect-appropriate, Distress- none acute Skin- rash-none, lesions- none, excoriation- none Lymphadenopathy- none Head- atraumatic            Eyes- Gross vision intact, PERRLA, conjunctivae clear secretions            Ears- Hearing, canals-normal            Nose- Clear, no-Septal dev, mucus, polyps, erosion, perforation             Throat- Mallampati II , mucosa clear , drainage- none, tonsils- atrophic. Missing teeth Neck- flexible , trachea midline, no stridor , thyroid nl, carotid no bruit Chest - symmetrical excursion , unlabored           Heart/CV- RRR with no extra beats , no murmur , no gallop   , no rub, nl s1 s2                           - JVD- none , edema- none, stasis changes- none, varices- none           Lung- + bilateral unlabored squeaks,  Work of breathing is not increased. cough- none ,                     dullness-none, rub- none           Chest wall- treatment Scar R breast, L upper anterior chest Abd- No HSM Br/ Gen/ Rectal- Not done, not indicated Extrem- cyanosis- none, clubbing, none, atrophy- none, strength- nl Neuro- +Tremor .

## 2014-08-10 NOTE — Assessment & Plan Note (Signed)
Clinical remission with residual scarring leading to chronic bronchitis with some bronchiectasis

## 2014-08-10 NOTE — Assessment & Plan Note (Signed)
Followed by Neurology. Aggravated by sympathomimetic bronchodilators. Plan- Change inhaler

## 2014-08-10 NOTE — Assessment & Plan Note (Signed)
Not a bacterial infection at this time Plan- Sample Incruse inhaler instead of Symbicort

## 2014-08-19 NOTE — Assessment & Plan Note (Signed)
Discussed ability to use metered inhalers, limited by her tremor Plan-Z-Pak to hold. Try an AeroChamber spacer

## 2014-08-19 NOTE — Assessment & Plan Note (Signed)
I think we are seeing overlay of sarcoid scarring and some radiation fibrosis. I don't believe sarcoid is significantly active but we are watching

## 2014-09-10 ENCOUNTER — Ambulatory Visit (INDEPENDENT_AMBULATORY_CARE_PROVIDER_SITE_OTHER): Payer: Medicare Other | Admitting: Internal Medicine

## 2014-09-10 ENCOUNTER — Encounter: Payer: Self-pay | Admitting: Internal Medicine

## 2014-09-10 ENCOUNTER — Ambulatory Visit (INDEPENDENT_AMBULATORY_CARE_PROVIDER_SITE_OTHER)
Admission: RE | Admit: 2014-09-10 | Discharge: 2014-09-10 | Disposition: A | Discharge status: Home / Self Care | Payer: Medicare Other | Source: Ambulatory Visit | Attending: Internal Medicine | Admitting: Internal Medicine

## 2014-09-10 VITALS — BP 128/76 | HR 86 | Ht 63.5 in | Wt 136.8 lb

## 2014-09-10 DIAGNOSIS — R071 Chest pain on breathing: Secondary | ICD-10-CM

## 2014-09-10 DIAGNOSIS — R0789 Other chest pain: Secondary | ICD-10-CM

## 2014-09-10 DIAGNOSIS — J441 Chronic obstructive pulmonary disease with (acute) exacerbation: Secondary | ICD-10-CM

## 2014-09-10 DIAGNOSIS — D869 Sarcoidosis, unspecified: Secondary | ICD-10-CM

## 2014-09-10 NOTE — Progress Notes (Signed)
07/23/11- 73 year old female never smoker followed for sarcoid, chronic bronchitis complicated by history of right breast cancer, tachycardia, GERD, anxiety, glaucoma.. Last here- 12/29/2010. She is here today with concern about an intermittent pain at the inferior aspect of her right scapula, noticed over the past month. It is increased by stress. She also feels tender across the lower left inferior costal margin and she notices occasional heartburn. She recently restarted exercise classes at the [facility]. She denies fever productive cough sore throat nodes or exertional chest pain. She recently had cardiology followup at [facility] where she was told heart problems might be from sarcoid. She doesn't understand anything specific about that.  09/03/11-  73 year old female never smoker followed for sarcoid, chronic bronchitis complicated by history of right breast cancer, tachycardia, GERD, anxiety, glaucoma.. She has felt well with no acute issues. She does comment on annoying tremor of her hands. Treated by neurology with primidone which caused headache and rapid heart rate. Cold air increased her cough a little bit. She does not think she has an infection and denies fever, sweats, swollen glands, pain or purulent discharge. ACE level -07/23/11- 61 which may indicate low grade activity despite her age. CXR- 07/23/2011-patchy density right middle lobe, suspected to be sarcoid by the radiologist. We discussed the fact that this is in the field of radiation therapy for right breast cancer and may be radiation change in the underlying lung.  12/04/11-  73 year old female never smoker followed for sarcoid, chronic bronchitis complicated by history of right breast cancer, tachycardia, GERD, anxiety, glaucoma.[name]  PCP Dr Dagmar Hait She keeps a mild cough which she feels in the left upper chest, productive of small amounts of white sputum with no pain fever or blood. Lites night sweats especially  if she is before bedtime.  Cough syrup causes "funny breathing", without exertional dyspnea. She does aerobics at the [employer]. With exercise she gets an ache in the left upper chest which will persist for about a day. Continues Timoptic eyedrops for glaucoma.  01/01/12- 73 year old female never smoker followed for sarcoid, chronic bronchitis complicated by history of right breast cancer, tachycardia, GERD, anxiety, glaucoma.[name]  PCP Dr Dagmar Hait CXR 12/08/11- images reviewed with her Comparison: Chest x-ray of 07/23/2011  Findings: Vague opacity in the right mid lung and hilar and  mediastinal adenopathy is stable in this patient with a history of  sarcoidosis. No definite active process is seen. Heart size is  stable. No bony abnormality is noted.  IMPRESSION:  Stable hilar - medial and adenopathy with the opacity in the right  mid lung in this patient with sarcoidosis. No active process.  Original Report Authenticated By: Joretta Bachelor, M.D.  Still coughs, white phlegm or dry. Primidone was changed to diastolic for tremor without increased chest tightness or wheeze. Continues timolol for glaucoma. PFT-12/21/11- normal spirometry with insignificant response to bronchodilator, normal lung volumes, diffusion at lower limits of normal. 6 minute walk test-96%, 96%, 99%, 324 m. Excellent oxygenation with no desaturation during this exercise.  02/09/12- 73 year old female never smoker followed for sarcoid, chronic bronchitis complicated by history of right breast cancer, tachycardia, GERD, anxiety, glaucoma.[name]  PCP Dr Dagmar Hait No longer feels she needs Qvar inhaler. No longer needs benzonatate Perles. Not much cough. She did not need to ask her eye doctor about Timolol eyedrops because she was not coughing. Asks about a tender point at the left lower costal margin Last ACE level 12/04/11- 5(L)  12/19/12- 73 year old female never smoker followed  for sarcoid, chronic bronchitis complicated by history of right  breast cancer, tachycardia, GERD, anxiety, glaucoma.[name]  PCP Dr Dagmar Hait ACUTE VISIT: recently dx'd by  Dr. Dagmar Hait with PNA; wanted to be rechecked by CY and at the request of Dr. Dagmar Hait. His CXR report not yet available.  She had incr cough with white sputum, no fever starting 2 weeks ago. Finished 7 days of levaquin 5 days ago. Now little dry cough. Using Qvar- had been wheezing. Feels washed out. Bilateral lower rib ache from cough. CXR 12/08/11 IMPRESSION:  Stable hilar - medial and adenopathy with the opacity in the right  mid lung in this patient with sarcoidosis. No active process.  Original Report Authenticated By: Joretta Bachelor, M.D.  03/21/13- 73 year old female never smoker followed for sarcoid, chronic bronchitis complicated by history of right breast cancer/ XRT, tachycardia, GERD, anxiety, glaucoma.[name]  PCP Dr Dagmar Hait Husband just died of lung cancer She had Valium but dislikes the rebound and asks if I could give her something for anxiety for a few days. Breathing has been stable with some cough, no sputum, no fever. CXR 12/21/12 IMPRESSION:  No active disease. Stable hilar adenopathy. Again noted post  radiation changes in right upper lobe.  Original Report Authenticated By: Lahoma Crocker, M.D.  06/21/13- 73 year old female never smoker followed for sarcoid, chronic bronchitis complicated by history of right breast cancer/ XRT, tachycardia, GERD, anxiety, glaucoma.[name]  PCP Dr Dagmar Hait  FOLLOWS FOR:  Dry cough. Denies wheezing or SOB otherwise unchanged since last OV Denies change in her dry cough without significant shortness of breath.  Valium did not help her tremor-has a neurology appointment. New complaint of diffuse itching especially upper chest and hands without rash. Tried Aquaphor. Wants to delay flu vaccine.  11/10/13- 73 year old female never smoker followed for sarcoid, chronic bronchitis complicated by history of right breast cancer/ XRT, tachycardia, GERD, anxiety, glaucoma, tremor.[name]  PCP Dr  Dagmar Hait ACUTE VISIT: has been having chest pain x 2 weeks and breathing hard. Runny nose and eyes as well. Seen by PCP-EKG neg. Patient describes aching pain left anterior chest wall, originally intermittent and now constant. PCP did swab negative for flu and recommended Tylenol. She took tramadol for headache, and says that's when the chest started hurting. Tremor is bothering her more. Neurologist in November restarted primidone which has not helped, although she admits not following directions. She does not plan to followup with that neurologist.  01/08/14- 73 year old female never smoker followed for sarcoid, chronic bronchitis complicated by history of right breast cancer/ XRT, tachycardia, GERD, anxiety, glaucoma, tremor.[name]  PCP Dr Dagmar Hait FOLLOWS FOR:  Breathing doing well, some wheezing-Began Breo Ellipta-reports does not like. CXR 11/10/13- IMPRESSION:  1. There is hyperinflation consistent with COPD. Stable increased  interstitial markings in the right perihilar region may be related  to the patient's sarcoidosis for reflect scarring.  2. There is stable enlargement of hilar lymph nodes bilaterally.  There is no evidence of CHF nor acute alveolar pneumonia.  3. Chest CT scanning may be useful if the patient's symptoms  persist.  Electronically Signed  By: David Martinique  On: 11/10/2013 11:36  01/25/14- 73 year old female never smoker followed for sarcoid, chronic bronchitis complicated by history of right breast cancer/ XRT, tachycardia, GERD, anxiety, glaucoma, tremor.[name]  PCP Dr Dagmar Hait ACUTE VISIT: continues to cough-not any better per patient. Completed the Zpak and phlegm is not colored now.  Denies any wheezing or SOB. Having dark spots in roof of mouth Did not like Breo.  05/10/14- 73 year old female never smoker followed for sarcoid, chronic bronchitis complicated by history of right breast cancer/ XRT, tachycardia, GERD, anxiety, glaucoma, tremor.[name]  PCP Dr Dagmar Hait FOLLOWS FOR:  Reports some sob  and cough with white mucus   08/09/14- 73 year old female never smoker followed for sarcoid, chronic bronchitis complicated by history of right breast cancer/ XRT, tachycardia, GERD, anxiety, glaucoma, tremor.[name]  PCP Dr Dagmar Hait FOLLOW FOR:  Sarcoidosis; Coughing up thick white mucus, chest pain Constant ache across upper anterior chest x 2-3 weeks. Symbicort gives sore throat and jitters. No fever, blood, exertional chest pain or radiation.  09/10/14- 73 year old female never smoker followed for sarcoid, chronic bronchitis complicated by history of right breast cancer/ XRT, tachycardia, GERD, anxiety, glaucoma, tremor.[name]  PCP Dr Dagmar Hait Coughing white- she considers this her baseline cough. Declines flu vaccine Chest pain better compared with last visit and she now only notices a tenderness upper left anterior chest about 2 cm diameter area above the breast. Also has some chronic aching across mid back which is not new.  ROS- see HPI Constitutional:   No-   weight loss, night sweats, fevers, chills,+ fatigue, lassitude. HEENT:   No-  headaches, difficulty swallowing, tooth/dental problems, sore throat,       No-  sneezing, itching, ear ache, nasal congestion, post nasal drip,  CV:  + chest pain, orthopnea, PND, swelling in lower extremities, anasarca, dizziness, palpitations Resp: No-   shortness of breath with exertion or at rest.             + productive cough,  + non-productive cough,  No-  coughing up of blood.              No-   change in color of mucus.  No- wheezing.   Skin: no-pruritus or rash  GI:  No-   heartburn, indigestion, abdominal pain, nausea, vomiting,  GU:. MS:  No-   joint pain or swelling.  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 No depression or anxiety.  No memory loss.  Obj General- Alert, Oriented, Affect-appropriate, Distress- none acute                    + Always slumped, almost kyphotic posture Skin- rash-none, lesions-  none, excoriation- none Lymphadenopathy- none Head- atraumatic            Eyes- Gross vision intact, PERRLA, conjunctivae clear secretions            Ears- Hearing, canals-normal            Nose- Clear, no-Septal dev, mucus, polyps, erosion, perforation             Throat- Mallampati II , mucosa clear , drainage- none, tonsils- atrophic. Missing teeth Neck- flexible , trachea midline, no stridor , thyroid nl, carotid no bruit Chest - symmetrical excursion , unlabored           Heart/CV- RRR with no extra beats , no murmur , no gallop  , no rub, nl s1                            s2                - JVD- none , edema- none, stasis changes- none, varices- none           Lung- + bilateral unlabored squeaks,  Work of breathing is not  increased.                              cough- none , dullness-none, rub- none           Chest wall- treatment Scar R breast, L upper anterior chest,  no specific                          chest wall tenderness Abd- No HSM Br/ Gen/ Rectal- Not done, not indicated Extrem- cyanosis- none, clubbing, none, atrophy- none, strength- nl Neuro- +Tremor .

## 2014-09-10 NOTE — Patient Instructions (Addendum)
Order- CXR left anterior chest wall pain  Suggest ibuprofen and a heating pad for the aching   Please call as needed

## 2014-09-15 NOTE — Assessment & Plan Note (Signed)
Noncardiac left upper anterior chest wall pain Plan-chest x-ray

## 2014-09-15 NOTE — Assessment & Plan Note (Signed)
She thinks cough might be a little worse but close to baseline with no obvious acute event otherwise. Plan-chest x-ray, no change in medication. Encouraged her to set up straighter for better lung inflation

## 2014-09-15 NOTE — Assessment & Plan Note (Signed)
Significant residual scarring but she seems to be in long-term remission

## 2014-09-17 ENCOUNTER — Ambulatory Visit (INDEPENDENT_AMBULATORY_CARE_PROVIDER_SITE_OTHER): Payer: Medicare Other | Admitting: Podiatry

## 2014-09-17 DIAGNOSIS — L84 Corns and callosities: Secondary | ICD-10-CM

## 2014-09-17 DIAGNOSIS — M79673 Pain in unspecified foot: Secondary | ICD-10-CM

## 2014-09-17 DIAGNOSIS — B351 Tinea unguium: Secondary | ICD-10-CM

## 2014-09-17 NOTE — Progress Notes (Signed)
Subjective:     Patient ID: Danielle Peters, female   DOB: 03/03/1941, 73 y.o.   MRN: [id number]  HPI patient presents with thick nailbeds 1-5 both feet that are yellow and discomforting and keratotic lesions on the left heel that is painful and the big toe of both feet   Review of Systems     Objective:   Physical Exam Neurovascular status intact with thick yellow brittle nailbeds 1-5 both feet that are painful and lesions on both feet that are painful when pressed    Assessment:     Chronic nail disease with mycosis and pain 1-5 both feet and lesions of both feet    Plan:     Debridement nailbeds 1-5 both feet with no iatrogenic bleeding noted and debridement lesions on both feet with no iatrogenic bleeding noted      

## 2014-10-08 ENCOUNTER — Encounter: Payer: Self-pay | Admitting: Neurology

## 2014-10-08 ENCOUNTER — Ambulatory Visit (INDEPENDENT_AMBULATORY_CARE_PROVIDER_SITE_OTHER): Payer: Medicare Other | Admitting: Neurology

## 2014-10-08 VITALS — BP 146/82 | HR 80 | Ht 63.0 in | Wt 134.0 lb

## 2014-10-08 DIAGNOSIS — G250 Essential tremor: Secondary | ICD-10-CM

## 2014-10-08 DIAGNOSIS — R251 Tremor, unspecified: Secondary | ICD-10-CM

## 2014-10-08 DIAGNOSIS — G252 Other specified forms of tremor: Principal | ICD-10-CM

## 2014-10-08 NOTE — Progress Notes (Signed)
[facility]  HISTORY OF PRESENT ILLNESS: Mrs. 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  She presenting with few years history of bilateral hands tremor, getting worse when she stretched out her hands, or using utensils, she has describe difficulty feeding herself, cause a lot of social embarrasement, she denies significant gait difficulty, she also has head titubation  She denied bilateral lower exremity paresthesia weakness, but does complain fingertips numbness tingling.  She denied loss of smell, REM sleep disorder, has anxiety symptoms, denied family history of tremor  Over the past few years, we have tried primadone, while taking 50mg  1/2 once a day , there was no significant improvement at that time, she also complains of headaches, but also tried diazepam, Ativan, without significant improvement  UPDATE June 12th 2015: She is taking primidone 50mg  bid am and pm, sometimes at noon, she did noticed increased bilateral hand shaking if she is not taking her primidone, she also noticed increased tremor in her left hand, unsteady gait, is taking aerobics class for balance training,  She has occasionally headaches, tramadol prn was helpful. She denies significant pain, no incontinence. She is very much concerned concerned about her neck pain, worsening gait difficulty,   UPDATE Sept 14th 2015: She has tried increased primidone to 50mg  tid, but complains of significant side effect, no help with her tremor, complains of drowsiness, increased anxiety, she did not try inderal 40mg  bid, worry about the side effect.  She also complains of anxiety,  difficulty holding forks in her hand, decreased appetite, weight loss  She is no longer taking Valium, which was prescribed by her primary care before.  UPDATE Oct 08 2014: She is no longer taking primidone, she is taking clonazepam 0.5 mg half to one tablets every morning, which has helped her tremor, she complains of anxiety, but does not want to take more medications for it  REVIEW OF SYSTEMS: Full 14 system review of systems performed and notable only for tremor, dizziness, anxiety   ALLERGIES: Allergies  Allergen Reactions  . Dextromethorphan Polistirex Er Other (See Comments)    SOB  . Primidone     Severe Headaches  . Albuterol     REACTION: tachycardia  . Asa [Aspirin]     Adult dose  . Clonazepam Hives  . Loratadine   . Simvastatin     Myalgias     HOME MEDICATIONS: Outpatient Prescriptions Prior to Visit  Medication Sig Dispense Refill  . aspirin 81 MG tablet Take 81 mg by mouth every other day.     . clonazePAM (KLONOPIN) 0.5 MG tablet Take 1 tablet (0.5 mg total) by mouth 2 (two) times daily as needed for anxiety. 60 tablet 5  . hydrochlorothiazide (HYDRODIURIL) 25 MG tablet Take 0.5 tablets (12.5 mg total) by mouth daily. 45 tablet 3  . lansoprazole (PREVACID) 30 MG capsule     . meclizine (ANTIVERT) 25 MG tablet 1 tablet daily.    . traMADol-acetaminophen (ULTRACET) 37.5-325 MG per tablet Take one as needed for headache. 30 tablet 5  . Umeclidinium Bromide (INCRUSE ELLIPTA) 62.5 MCG/INH AEPB Inhale 1 puff into the lungs daily.    . valsartan (DIOVAN) 80 MG tablet Take 80 mg by mouth daily.    . Olopatadine  HCl (PAZEO) 0.7 % SOLN Apply to eye. Prn itchy eyes     No facility-administered medications prior to visit.    PAST MEDICAL HISTORY: Past Medical History  Diagnosis Date  . Cancer     breast ca  . Hypertension   . Pulmonary sarcoidosis   . Anxiety   . Esophageal reflux   . Anemia   . 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 Dyslipidemia     myaligia with statins  . Tremor     upper extremities  . Chest pain     echo 01/08/10- nl lv; mild aortic sclerosis; myoview 01/08/10- no ischemia    PAST SURGICAL HISTORY: Past Surgical History  Procedure Laterality Date  . Left breast lumpectomy,chemo  2004    xrt  . Skin biopsy      FAMILY HISTORY: History reviewed. No pertinent family history.  SOCIAL HISTORY: History   Social History  . Marital Status: Divorced    Spouse Name: N/A    Number of Children: 2  . 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 Smoking status: Never Smoker   . Smokeless tobacco: Never Used  . Alcohol Use: No  . Drug Use: No  . Sexual Activity: Not on file   Other Topics Concern  . Not on file   Social History Narrative   Patient lives at home alone. Patient is retired.Pateint has high school education. And went to [employer].   Right handed.   Caffeine- one cup daily.     PHYSICAL EXAM  Filed Vitals:   10/08/14 1327  BP: 146/82  Pulse: 80  Height: 5\' 3"  (1.6 m)  Weight: 134 lb (60.782 kg)   Body mass index is 23.74 kg/(m^2).  Generalized: Well developed, in no acute distress  Head: normocephalic and atraumatic,. Oropharynx benign  Neck: Supple, no carotid bruits  Cardiac: Regular rate rhythm, no murmur  Musculoskeletal: No deformity   Neurological examination   Mentation: Alert oriented to time, place, history taking. Follows all commands speech and language fluent  Cranial nerve II-XII: Pupils were equal round reactive to light extraocular movements were full, visual field were full on confrontational test. Facial sensation and strength were normal. hearing loss left ear. Uvula tongue midline. head turning and shoulder shrug and were normal and symmetric.Tongue protrusion into cheek strength was normal. She has occasional head No-No  tremor. Motor: normal bulk and tone, full strength in bilateral upper and lower extremity muscles, she has bilateral upper extremity postural tremor, right more than left,   There was no significant rigidity, she also has head titubation, anterocollis Sensory: normal and symmetric to light touch, pinprick, and  vibration  Coordination: finger-nose-finger, heel-to-shin bilaterally, no dysmetria Reflexes: Brachioradialis 2/2, biceps 2/2, triceps 2/2, patellar 2/2, Achilles 2/2, plantar responses were flexor bilaterally. Gait and Station: Rising up from seated position without assistance,  Leaning forward, mildly unsteady, wide based,stiff  ASSESSMENT AND PLAN  73 y.o. year old female with essential tremor, there was no parkinsonian features on examination  1. Keep clonazepam 0. 5 mg 1/2 to one tab twice a day 2 Return to clinic in 6 months with nurse practitioner  Marcial Pacas, M.D. [facility] [address], [place], [place] [address] [phone]

## 2014-10-10 ENCOUNTER — Ambulatory Visit: Payer: Medicare Other | Admitting: Nurse Practitioner

## 2014-12-07 ENCOUNTER — Telehealth: Payer: Self-pay | Admitting: Internal Medicine

## 2014-12-07 NOTE — Telephone Encounter (Signed)
Called and spoke to pt. Informed pt of the recs per CY. Appt made with CY on 12/10/2014. Pt verbalized understanding and denied any further questions or concerns at this time.

## 2014-12-07 NOTE — Telephone Encounter (Signed)
Called and spoke with pt and she stated that she has been having pain in left breast/chest and tingling in both hands x 1 week. She denies any SOB, pain with deep breathing, congestion,fever, chills or sweats.   Pt stated that she has a cough at times.  Has been taking advil and this does not help with the pain.  Pt wanted to be seen by CY for further eval.  CY please advise. Thanks  Last ov--09/10/14 Next ov--03/11/15  Allergies  Allergen Reactions  . Dextromethorphan Polistirex Er Other (See Comments)    SOB  . Primidone     Severe Headaches  . Albuterol     REACTION: tachycardia  . Asa [Aspirin]     Adult dose  . Clonazepam Hives  . Loratadine   . Simvastatin     Myalgias     Current Outpatient Prescriptions on File Prior to Visit  Medication Sig Dispense Refill  . aspirin 81 MG tablet Take 81 mg by mouth every other day.     . clonazePAM (KLONOPIN) 0.5 MG tablet Take 1 tablet (0.5 mg total) by mouth 2 (two) times daily as needed for anxiety. 60 tablet 5  . hydrochlorothiazide (HYDRODIURIL) 25 MG tablet Take 0.5 tablets (12.5 mg total) by mouth daily. 45 tablet 3  . lansoprazole (PREVACID) 30 MG capsule     . meclizine (ANTIVERT) 25 MG tablet 1 tablet daily.    . traMADol-acetaminophen (ULTRACET) 37.5-325 MG per tablet Take one as needed for headache. 30 tablet 5  . Umeclidinium Bromide (INCRUSE ELLIPTA) 62.5 MCG/INH AEPB Inhale 1 puff into the lungs daily.    . valsartan (DIOVAN) 80 MG tablet Take 80 mg by mouth daily.     No current facility-administered medications on file prior to visit.

## 2014-12-07 NOTE — Telephone Encounter (Signed)
[phone] pt calling back again

## 2014-12-07 NOTE — Telephone Encounter (Signed)
Suggest tylenol and a heating pad over the weekend.  Perhaps we can work her in next week??

## 2014-12-10 ENCOUNTER — Ambulatory Visit (INDEPENDENT_AMBULATORY_CARE_PROVIDER_SITE_OTHER): Payer: Medicare Other | Admitting: Internal Medicine

## 2014-12-10 ENCOUNTER — Encounter: Payer: Self-pay | Admitting: Internal Medicine

## 2014-12-10 VITALS — BP 118/64 | HR 104 | Ht 63.0 in | Wt 136.0 lb

## 2014-12-10 DIAGNOSIS — R0789 Other chest pain: Secondary | ICD-10-CM

## 2014-12-10 DIAGNOSIS — D869 Sarcoidosis, unspecified: Secondary | ICD-10-CM

## 2014-12-10 MED ORDER — CYCLOBENZAPRINE HCL 10 MG PO TABS: 10.0000 mg | ORAL_TABLET | Freq: Three times a day (TID) | ORAL | Status: DC | PRN

## 2014-12-10 NOTE — Patient Instructions (Addendum)
Try tylenol, for pain, and a heating pad as needed.  Script sent for flexeril. Try 1/2 tab to start- see if that is enough, before trying the 10 mg tab  Keep the May appointment unless you need sooner

## 2014-12-10 NOTE — Progress Notes (Signed)
07/23/11- 74 year old female never smoker followed for sarcoid, chronic bronchitis complicated by history of right breast cancer, tachycardia, GERD, anxiety, glaucoma.. Last here- 12/29/2010. She is here today with concern about an intermittent pain at the inferior aspect of her right scapula, noticed over the past month. It is increased by stress. She also feels tender across the lower left inferior costal margin and she notices occasional heartburn. She recently restarted exercise classes at the [facility]. She denies fever productive cough sore throat nodes or exertional chest pain. She recently had cardiology followup at [facility] where she was told heart problems might be from sarcoid. She doesn't understand anything specific about that.  09/03/11-  74 year old female never smoker followed for sarcoid, chronic bronchitis complicated by history of right breast cancer, tachycardia, GERD, anxiety, glaucoma.. She has felt well with no acute issues. She does comment on annoying tremor of her hands. Treated by neurology with primidone which caused headache and rapid heart rate. Cold air increased her cough a little bit. She does not think she has an infection and denies fever, sweats, swollen glands, pain or purulent discharge. ACE level -07/23/11- 61 which may indicate low grade activity despite her age. CXR- 07/23/2011-patchy density right middle lobe, suspected to be sarcoid by the radiologist. We discussed the fact that this is in the field of radiation therapy for right breast cancer and may be radiation change in the underlying lung.  12/04/11-  74 year old female never smoker followed for sarcoid, chronic bronchitis complicated by history of right breast cancer, tachycardia, GERD, anxiety, glaucoma.[name]  PCP Dr Dagmar Hait She keeps a mild cough which she feels in the left upper chest, productive of small amounts of white sputum with no pain fever or blood. Lites night sweats especially  if she is before bedtime.  Cough syrup causes "funny breathing", without exertional dyspnea. She does aerobics at the [employer]. With exercise she gets an ache in the left upper chest which will persist for about a day. Continues Timoptic eyedrops for glaucoma.  01/01/12- 74 year old female never smoker followed for sarcoid, chronic bronchitis complicated by history of right breast cancer, tachycardia, GERD, anxiety, glaucoma.[name]  PCP Dr Dagmar Hait CXR 12/08/11- images reviewed with her Comparison: Chest x-ray of 07/23/2011  Findings: Vague opacity in the right mid lung and hilar and  mediastinal adenopathy is stable in this patient with a history of  sarcoidosis. No definite active process is seen. Heart size is  stable. No bony abnormality is noted.  IMPRESSION:  Stable hilar - medial and adenopathy with the opacity in the right  mid lung in this patient with sarcoidosis. No active process.  Original Report Authenticated By: Joretta Bachelor, M.D.  Still coughs, white phlegm or dry. Primidone was changed to diastolic for tremor without increased chest tightness or wheeze. Continues timolol for glaucoma. PFT-12/21/11- normal spirometry with insignificant response to bronchodilator, normal lung volumes, diffusion at lower limits of normal. 6 minute walk test-96%, 96%, 99%, 324 m. Excellent oxygenation with no desaturation during this exercise.  02/09/12- 74 year old female never smoker followed for sarcoid, chronic bronchitis complicated by history of right breast cancer, tachycardia, GERD, anxiety, glaucoma.[name]  PCP Dr Dagmar Hait No longer feels she needs Qvar inhaler. No longer needs benzonatate Perles. Not much cough. She did not need to ask her eye doctor about Timolol eyedrops because she was not coughing. Asks about a tender point at the left lower costal margin Last ACE level 12/04/11- 5(L)  12/19/12- 74 year old female never smoker followed  for sarcoid, chronic bronchitis complicated by history of right  breast cancer, tachycardia, GERD, anxiety, glaucoma.[name]  PCP Dr Dagmar Hait ACUTE VISIT: recently dx'd by  Dr. Dagmar Hait with PNA; wanted to be rechecked by CY and at the request of Dr. Dagmar Hait. His CXR report not yet available.  She had incr cough with white sputum, no fever starting 2 weeks ago. Finished 7 days of levaquin 5 days ago. Now little dry cough. Using Qvar- had been wheezing. Feels washed out. Bilateral lower rib ache from cough. CXR 12/08/11 IMPRESSION:  Stable hilar - medial and adenopathy with the opacity in the right  mid lung in this patient with sarcoidosis. No active process.  Original Report Authenticated By: Joretta Bachelor, M.D.  03/21/13- 74 year old female never smoker followed for sarcoid, chronic bronchitis complicated by history of right breast cancer/ XRT, tachycardia, GERD, anxiety, glaucoma.[name]  PCP Dr Dagmar Hait Husband just died of lung cancer She had Valium but dislikes the rebound and asks if I could give her something for anxiety for a few days. Breathing has been stable with some cough, no sputum, no fever. CXR 12/21/12 IMPRESSION:  No active disease. Stable hilar adenopathy. Again noted post  radiation changes in right upper lobe.  Original Report Authenticated By: Lahoma Crocker, M.D.  06/21/13- 74 year old female never smoker followed for sarcoid, chronic bronchitis complicated by history of right breast cancer/ XRT, tachycardia, GERD, anxiety, glaucoma.[name]  PCP Dr Dagmar Hait  FOLLOWS FOR:  Dry cough. Denies wheezing or SOB otherwise unchanged since last OV Denies change in her dry cough without significant shortness of breath.  Valium did not help her tremor-has a neurology appointment. New complaint of diffuse itching especially upper chest and hands without rash. Tried Aquaphor. Wants to delay flu vaccine.  11/10/13- 74 year old female never smoker followed for sarcoid, chronic bronchitis complicated by history of right breast cancer/ XRT, tachycardia, GERD, anxiety, glaucoma, tremor.[name]  PCP Dr  Dagmar Hait ACUTE VISIT: has been having chest pain x 2 weeks and breathing hard. Runny nose and eyes as well. Seen by PCP-EKG neg. Patient describes aching pain left anterior chest wall, originally intermittent and now constant. PCP did swab negative for flu and recommended Tylenol. She took tramadol for headache, and says that's when the chest started hurting. Tremor is bothering her more. Neurologist in November restarted primidone which has not helped, although she admits not following directions. She does not plan to followup with that neurologist.  01/08/14- 74 year old female never smoker followed for sarcoid, chronic bronchitis complicated by history of right breast cancer/ XRT, tachycardia, GERD, anxiety, glaucoma, tremor.[name]  PCP Dr Dagmar Hait FOLLOWS FOR:  Breathing doing well, some wheezing-Began Breo Ellipta-reports does not like. CXR 11/10/13- IMPRESSION:  1. There is hyperinflation consistent with COPD. Stable increased  interstitial markings in the right perihilar region may be related  to the patient's sarcoidosis for reflect scarring.  2. There is stable enlargement of hilar lymph nodes bilaterally.  There is no evidence of CHF nor acute alveolar pneumonia.  3. Chest CT scanning may be useful if the patient's symptoms  persist.  Electronically Signed  By: David Martinique  On: 11/10/2013 11:36  01/25/14- 74 year old female never smoker followed for sarcoid, chronic bronchitis complicated by history of right breast cancer/ XRT, tachycardia, GERD, anxiety, glaucoma, tremor.[name]  PCP Dr Dagmar Hait ACUTE VISIT: continues to cough-not any better per patient. Completed the Zpak and phlegm is not colored now.  Denies any wheezing or SOB. Having dark spots in roof of mouth Did not like Breo.  05/10/14- 74 year old female never smoker followed for sarcoid, chronic bronchitis complicated by history of right breast cancer/ XRT, tachycardia, GERD, anxiety, glaucoma, tremor.[name]  PCP Dr Dagmar Hait FOLLOWS FOR:  Reports some sob  and cough with white mucus   08/09/14- 74 year old female never smoker followed for sarcoid, chronic bronchitis complicated by history of right breast cancer/ XRT, tachycardia, GERD, anxiety, glaucoma, tremor.[name]  PCP Dr Dagmar Hait FOLLOW FOR:  Sarcoidosis; Coughing up thick white mucus, chest pain Constant ache across upper anterior chest x 2-3 weeks. Symbicort gives sore throat and jitters. No fever, blood, exertional chest pain or radiation.  09/10/14- 74 year old female never smoker followed for sarcoid, chronic bronchitis complicated by history of right breast cancer/ XRT, tachycardia, GERD, anxiety, glaucoma, tremor.[name]  PCP Dr Dagmar Hait Coughing white- she considers this her baseline cough. Declines flu vaccine Chest pain better compared with last visit and she now only notices a tenderness upper left anterior chest about 2 cm diameter area above the breast. Also has some chronic aching across mid back which is not new.  12/10/14- 74 year old female never smoker followed for sarcoid, chronic bronchitis complicated by history of right breast cancer/ XRT, tachycardia, GERD, anxiety, glaucoma, tremor.[name]  PCP Dr Beverlyn Roux for: week ago, CP w/LT arm pain, achy feeling in arm and chest; ache across shoulder blades; tingling in fingers worse during this time.  Shifting anterior chest pains seem not related to pains in her left upper arm. The arm pains are probably not affected by use or movement. Fingers have been tingling "quite a while" which she thinks may be related to her prior chemotherapy. She asks treatment with a muscle relaxer like Flexeril. CXR 09/10/14-  IMPRESSION: 1. Pleural parenchymal scarring right lung, no change. 2. No acute cardiopulmonary disease. 3. Prior right axillary dissection. Electronically Signed  By: Marcello Moores Register  On: 09/10/2014 10:10 CXR 10/31/14 Disc from [facility] w/o report. Image looks stable, unchanged scarring R.  ROS- see HPI Constitutional:   No-   weight loss,  night sweats, fevers, chills,+ fatigue, lassitude. HEENT:   No-  headaches, difficulty swallowing, tooth/dental problems, sore throat,       No-  sneezing, itching, ear ache, nasal congestion, post nasal drip,  CV:  + chest pain, orthopnea, PND, swelling in lower extremities, anasarca, dizziness, palpitations Resp: No-   shortness of breath with exertion or at rest.             + productive cough,  + non-productive cough,  No-  coughing up of blood.              No-   change in color of mucus.  No- wheezing.   Skin: no-pruritus or rash  GI:  No-   heartburn, indigestion, abdominal pain, nausea, vomiting,  GU:. MS:  No-   joint pain or swelling.  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 No depression or anxiety.  No memory loss.  Obj General- Alert, Oriented, Affect-appropriate, Distress- none acute                    + Always slumped, almost kyphotic posture Skin- rash-none, lesions- none, excoriation- none Lymphadenopathy- none Head- atraumatic            Eyes- Gross vision intact, PERRLA, conjunctivae clear secretions            Ears- Hearing, canals-normal            Nose- Clear, no-Septal dev, mucus,  polyps, erosion, perforation             Throat- Mallampati II , mucosa clear , drainage- none, tonsils- atrophic. Missing teeth Neck- flexible , trachea midline, no stridor , thyroid nl, carotid no bruit Chest - symmetrical excursion , unlabored           Heart/CV- RRR with no extra beats , no murmur , no gallop  , no rub, nl s1                            s2                - JVD- none , edema- none, stasis changes- none, varices- none           Lung- + bilateral unlabored squeaks,  Work of breathing is not increased.                              cough- none , dullness-none, rub- none           Chest wall- treatment Scar R breast, L upper anterior chest,  + 2 areas of focal tenderness to pressure left upper anterior chest wall Abd- No  HSM Br/ Gen/ Rectal- Not done, not indicated Extrem- cyanosis- none, clubbing, none, atrophy- none, strength- nl Neuro- +Tremor .

## 2014-12-17 ENCOUNTER — Ambulatory Visit (INDEPENDENT_AMBULATORY_CARE_PROVIDER_SITE_OTHER): Payer: Medicare Other | Admitting: Podiatry

## 2014-12-17 DIAGNOSIS — M79673 Pain in unspecified foot: Secondary | ICD-10-CM

## 2014-12-17 DIAGNOSIS — L84 Corns and callosities: Secondary | ICD-10-CM | POA: Diagnosis not present

## 2014-12-17 DIAGNOSIS — B351 Tinea unguium: Secondary | ICD-10-CM | POA: Diagnosis not present

## 2014-12-17 NOTE — Progress Notes (Signed)
Subjective:     Patient ID: Danielle Peters, female   DOB: Mar 09, 1941, 74 y.o.   MRN: [id number]  HPI patient presents with thick nailbeds 1-5 both feet that are yellow and discomforting and keratotic lesions on the left heel that is painful and the big toe of both feet   Review of Systems     Objective:   Physical Exam Neurovascular status intact with thick yellow brittle nailbeds 1-5 both feet that are painful and lesions on both feet that are painful when pressed    Assessment:     Chronic nail disease with mycosis and pain 1-5 both feet and lesions of both feet    Plan:     Debridement nailbeds 1-5 both feet with no iatrogenic bleeding noted and debridement lesions on both feet with no iatrogenic bleeding noted

## 2014-12-18 NOTE — Assessment & Plan Note (Signed)
Clinically burned-out with significant residual scarring, especially in the right lung radiation field

## 2014-12-18 NOTE — Assessment & Plan Note (Addendum)
Areas of left upper anterior chest wall tenderness need to be watched. I doubt these represent metastases but that would be a consideration if they got worse. Plan-Flexeril at her request for trial

## 2015-02-11 ENCOUNTER — Other Ambulatory Visit: Payer: Self-pay | Admitting: Neurology

## 2015-02-11 NOTE — Telephone Encounter (Signed)
Rx signed and faxed.

## 2015-02-13 DIAGNOSIS — F39 Unspecified mood [affective] disorder: Secondary | ICD-10-CM | POA: Insufficient documentation

## 2015-02-17 HISTORY — PX: UPPER GI ENDOSCOPY: SHX6162

## 2015-03-04 ENCOUNTER — Ambulatory Visit (INDEPENDENT_AMBULATORY_CARE_PROVIDER_SITE_OTHER): Payer: Medicare Other | Admitting: Podiatry

## 2015-03-04 DIAGNOSIS — L84 Corns and callosities: Secondary | ICD-10-CM | POA: Diagnosis not present

## 2015-03-04 DIAGNOSIS — M79673 Pain in unspecified foot: Secondary | ICD-10-CM | POA: Diagnosis not present

## 2015-03-04 DIAGNOSIS — B351 Tinea unguium: Secondary | ICD-10-CM

## 2015-03-04 NOTE — Progress Notes (Signed)
Subjective:     Patient ID: Danielle Peters, female   DOB: 16-Sep-1941, 74 y.o.   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

## 2015-03-11 ENCOUNTER — Ambulatory Visit: Payer: Medicare Other | Admitting: Internal Medicine

## 2015-03-20 HISTORY — PX: OTHER SURGICAL HISTORY: SHX169

## 2015-03-27 ENCOUNTER — Ambulatory Visit (INDEPENDENT_AMBULATORY_CARE_PROVIDER_SITE_OTHER): Payer: Medicare Other | Admitting: Internal Medicine

## 2015-03-27 VITALS — BP 122/66 | HR 92 | Ht 63.0 in | Wt 134.0 lb

## 2015-03-27 DIAGNOSIS — J449 Chronic obstructive pulmonary disease, unspecified: Secondary | ICD-10-CM | POA: Diagnosis not present

## 2015-03-27 DIAGNOSIS — K219 Gastro-esophageal reflux disease without esophagitis: Secondary | ICD-10-CM | POA: Diagnosis not present

## 2015-03-27 DIAGNOSIS — R0789 Other chest pain: Secondary | ICD-10-CM | POA: Diagnosis not present

## 2015-03-27 MED ORDER — BENZONATATE 200 MG PO CAPS: 200.0000 mg | ORAL_CAPSULE | Freq: Three times a day (TID) | ORAL | Status: DC | PRN

## 2015-03-27 NOTE — Progress Notes (Signed)
07/23/11- 74 year old female never smoker followed for sarcoid, chronic bronchitis complicated by history of right breast cancer, tachycardia, GERD, anxiety, glaucoma.. Last here- 12/29/2010. She is here today with concern about an intermittent pain at the inferior aspect of her right scapula, noticed over the past month. It is increased by stress. She also feels tender across the lower left inferior costal margin and she notices occasional heartburn. She recently restarted exercise classes at the [facility]. She denies fever productive cough sore throat nodes or exertional chest pain. She recently had cardiology followup at [facility] where she was told heart problems might be from sarcoid. She doesn't understand anything specific about that.  09/03/11-  74 year old female never smoker followed for sarcoid, chronic bronchitis complicated by history of right breast cancer, tachycardia, GERD, anxiety, glaucoma.. She has felt well with no acute issues. She does comment on annoying tremor of her hands. Treated by neurology with primidone which caused headache and rapid heart rate. Cold air increased her cough a little bit. She does not think she has an infection and denies fever, sweats, swollen glands, pain or purulent discharge. ACE level -07/23/11- 61 which may indicate low grade activity despite her age. CXR- 07/23/2011-patchy density right middle lobe, suspected to be sarcoid by the radiologist. We discussed the fact that this is in the field of radiation therapy for right breast cancer and may be radiation change in the underlying lung.  12/04/11-  73 year old female never smoker followed for sarcoid, chronic bronchitis complicated by history of right breast cancer, tachycardia, GERD, anxiety, glaucoma.[name]  PCP Dr Dagmar Hait She keeps a mild cough which she feels in the left upper chest, productive of small amounts of white sputum with no pain fever or blood. Lites night sweats especially  if she is before bedtime.  Cough syrup causes "funny breathing", without exertional dyspnea. She does aerobics at the [employer]. With exercise she gets an ache in the left upper chest which will persist for about a day. Continues Timoptic eyedrops for glaucoma.  01/01/12- 74 year old female never smoker followed for sarcoid, chronic bronchitis complicated by history of right breast cancer, tachycardia, GERD, anxiety, glaucoma.[name]  PCP Dr Dagmar Hait CXR 12/08/11- images reviewed with her Comparison: Chest x-ray of 07/23/2011  Findings: Vague opacity in the right mid lung and hilar and  mediastinal adenopathy is stable in this patient with a history of  sarcoidosis. No definite active process is seen. Heart size is  stable. No bony abnormality is noted.  IMPRESSION:  Stable hilar - medial and adenopathy with the opacity in the right  mid lung in this patient with sarcoidosis. No active process.  Original Report Authenticated By: Joretta Bachelor, M.D.  Still coughs, white phlegm or dry. Primidone was changed to diastolic for tremor without increased chest tightness or wheeze. Continues timolol for glaucoma. PFT-12/21/11- normal spirometry with insignificant response to bronchodilator, normal lung volumes, diffusion at lower limits of normal. 6 minute walk test-96%, 96%, 99%, 324 m. Excellent oxygenation with no desaturation during this exercise.  02/09/12- 74 year old female never smoker followed for sarcoid, chronic bronchitis complicated by history of right breast cancer, tachycardia, GERD, anxiety, glaucoma.[name]  PCP Dr Dagmar Hait No longer feels she needs Qvar inhaler. No longer needs benzonatate Perles. Not much cough. She did not need to ask her eye doctor about Timolol eyedrops because she was not coughing. Asks about a tender point at the left lower costal margin Last ACE level 12/04/11- 5(L)  12/19/12- 74 year old female never smoker followed  for sarcoid, chronic bronchitis complicated by history of right  breast cancer, tachycardia, GERD, anxiety, glaucoma.[name]  PCP Dr Dagmar Hait ACUTE VISIT: recently dx'd by  Dr. Dagmar Hait with PNA; wanted to be rechecked by CY and at the request of Dr. Dagmar Hait. His CXR report not yet available.  She had incr cough with white sputum, no fever starting 2 weeks ago. Finished 7 days of levaquin 5 days ago. Now little dry cough. Using Qvar- had been wheezing. Feels washed out. Bilateral lower rib ache from cough. CXR 12/08/11 IMPRESSION:  Stable hilar - medial and adenopathy with the opacity in the right  mid lung in this patient with sarcoidosis. No active process.  Original Report Authenticated By: Joretta Bachelor, M.D.  03/21/13- 74 year old female never smoker followed for sarcoid, chronic bronchitis complicated by history of right breast cancer/ XRT, tachycardia, GERD, anxiety, glaucoma.[name]  PCP Dr Dagmar Hait Husband just died of lung cancer She had Valium but dislikes the rebound and asks if I could give her something for anxiety for a few days. Breathing has been stable with some cough, no sputum, no fever. CXR 12/21/12 IMPRESSION:  No active disease. Stable hilar adenopathy. Again noted post  radiation changes in right upper lobe.  Original Report Authenticated By: Lahoma Crocker, M.D.  06/21/13- 74 year old female never smoker followed for sarcoid, chronic bronchitis complicated by history of right breast cancer/ XRT, tachycardia, GERD, anxiety, glaucoma.[name]  PCP Dr Dagmar Hait  FOLLOWS FOR:  Dry cough. Denies wheezing or SOB otherwise unchanged since last OV Denies change in her dry cough without significant shortness of breath.  Valium did not help her tremor-has a neurology appointment. New complaint of diffuse itching especially upper chest and hands without rash. Tried Aquaphor. Wants to delay flu vaccine.  11/10/13- 74 year old female never smoker followed for sarcoid, chronic bronchitis complicated by history of right breast cancer/ XRT, tachycardia, GERD, anxiety, glaucoma, tremor.[name]  PCP Dr  Dagmar Hait ACUTE VISIT: has been having chest pain x 2 weeks and breathing hard. Runny nose and eyes as well. Seen by PCP-EKG neg. Patient describes aching pain left anterior chest wall, originally intermittent and now constant. PCP did swab negative for flu and recommended Tylenol. She took tramadol for headache, and says that's when the chest started hurting. Tremor is bothering her more. Neurologist in November restarted primidone which has not helped, although she admits not following directions. She does not plan to followup with that neurologist.  01/08/14- 74 year old female never smoker followed for sarcoid, chronic bronchitis complicated by history of right breast cancer/ XRT, tachycardia, GERD, anxiety, glaucoma, tremor.[name]  PCP Dr Dagmar Hait FOLLOWS FOR:  Breathing doing well, some wheezing-Began Breo Ellipta-reports does not like. CXR 11/10/13- IMPRESSION:  1. There is hyperinflation consistent with COPD. Stable increased  interstitial markings in the right perihilar region may be related  to the patient's sarcoidosis for reflect scarring.  2. There is stable enlargement of hilar lymph nodes bilaterally.  There is no evidence of CHF nor acute alveolar pneumonia.  3. Chest CT scanning may be useful if the patient's symptoms  persist.  Electronically Signed  By: David Martinique  On: 11/10/2013 11:36  01/25/14- 74 year old female never smoker followed for sarcoid, chronic bronchitis complicated by history of right breast cancer/ XRT, tachycardia, GERD, anxiety, glaucoma, tremor.[name]  PCP Dr Dagmar Hait ACUTE VISIT: continues to cough-not any better per patient. Completed the Zpak and phlegm is not colored now.  Denies any wheezing or SOB. Having dark spots in roof of mouth Did not like Breo.  05/10/14- 74 year old female never smoker followed for sarcoid, chronic bronchitis complicated by history of right breast cancer/ XRT, tachycardia, GERD, anxiety, glaucoma, tremor.[name]  PCP Dr Dagmar Hait FOLLOWS FOR:  Reports some sob  and cough with white mucus   08/09/14- 74 year old female never smoker followed for sarcoid, chronic bronchitis complicated by history of right breast cancer/ XRT, tachycardia, GERD, anxiety, glaucoma, tremor.[name]  PCP Dr Dagmar Hait FOLLOW FOR:  Sarcoidosis; Coughing up thick white mucus, chest pain Constant ache across upper anterior chest x 2-3 weeks. Symbicort gives sore throat and jitters. No fever, blood, exertional chest pain or radiation.  09/10/14- 74 year old female never smoker followed for sarcoid, chronic bronchitis complicated by history of right breast cancer/ XRT, tachycardia, GERD, anxiety, glaucoma, tremor.[name]  PCP Dr Dagmar Hait Coughing white- she considers this her baseline cough. Declines flu vaccine Chest pain better compared with last visit and she now only notices a tenderness upper left anterior chest about 2 cm diameter area above the breast. Also has some chronic aching across mid back which is not new.  12/10/14- 74 year old female never smoker followed for sarcoid, chronic bronchitis complicated by history of right breast cancer/ XRT, tachycardia, GERD, anxiety, glaucoma, tremor.[name]  PCP Dr Beverlyn Roux for: week ago, CP w/LT arm pain, achy feeling in arm and chest; ache across shoulder blades; tingling in fingers worse during this time.  Shifting anterior chest pains seem not related to pains in her left upper arm. The arm pains are probably not affected by use or movement. Fingers have been tingling "quite a while" which she thinks may be related to her prior chemotherapy. She asks treatment with a muscle relaxer like Flexeril. CXR 09/10/14-  IMPRESSION: 1. Pleural parenchymal scarring right lung, no change. 2. No acute cardiopulmonary disease. 3. Prior right axillary dissection. Electronically Signed  By: Marcello Moores Register  On: 09/10/2014 10:10 CXR 10/31/14 Disc from [facility] w/o report. Image looks stable, unchanged scarring R.  03/27/15- 73 year old female never smoker followed  for sarcoid, chronic bronchitis complicated by history of right breast cancer/ XRT, tachycardia, GERD, anxiety, glaucoma, tremor.[name]  PCP Dr Dagmar Hait Reports:Pt. states that she is doing well since last visit. Pt. is having cough no mucus, chest will hurt at times but she can tolerate it.. [facility]/ GI did an endoscopy. Stable persistent dyspnea "breathing hard". Forced deep breath sometimes hurts left lower anterior costal margin-not new. Chest muscles a little sore recently from exercise.  ROS- see HPI Constitutional:   No-   weight loss, night sweats, fevers, chills,+ fatigue, lassitude. HEENT:   No-  headaches, difficulty swallowing, tooth/dental problems, sore throat,       No-  sneezing, itching, ear ache, nasal congestion, post nasal drip,  CV:  + chest pain, orthopnea, PND, swelling in lower extremities, anasarca, dizziness, palpitations Resp: No-   shortness of breath with exertion or at rest.             + productive cough,  + non-productive cough,  No-  coughing up of blood.              No-   change in color of mucus.  No- wheezing.   Skin: no-pruritus or rash  GI:  No-   heartburn, indigestion, abdominal pain, nausea, vomiting,  GU:. MS:  No-   joint pain or swelling.  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 No depression or anxiety.  No memory loss.  Obj General- Alert, Oriented, Affect-appropriate, Distress- none acute                    +  Always slumped, almost kyphotic posture Skin- rash-none, lesions- none, excoriation- none Lymphadenopathy- none Head- atraumatic            Eyes- Gross vision intact, PERRLA, conjunctivae clear secretions            Ears- Hearing, canals-normal            Nose- Clear, no-Septal dev, mucus, polyps, erosion, perforation             Throat- Mallampati II , mucosa clear , drainage- none, tonsils- atrophic. Missing teeth Neck- flexible , trachea midline, no stridor , thyroid nl, carotid no bruit Chest  - symmetrical excursion , unlabored           Heart/CV- RRR with no extra beats , no murmur , no gallop  , no rub, nl s1 s2                - JVD- none , edema- none, stasis changes- none, varices- none           Lung- + bilateral unlabored squeaks,  Work of breathing is not increased.   cough- none , dullness-none, rub- none           Chest wall- treatment Scar R breast, L upper anterior chest,   Abd- No HSM Br/ Gen/ Rectal- Not done, not indicated Extrem- cyanosis- none, clubbing, none, atrophy- none, strength- nl Neuro- +Tremor .

## 2015-03-27 NOTE — Patient Instructions (Signed)
Script sent for benzonatate perles for cough as needed  Tylenol and a heating pad might help your rib and chest pains.

## 2015-03-28 ENCOUNTER — Encounter: Payer: Self-pay | Admitting: Internal Medicine

## 2015-04-01 ENCOUNTER — Ambulatory Visit: Payer: Medicare Other

## 2015-04-04 ENCOUNTER — Ambulatory Visit: Payer: Medicare Other

## 2015-04-09 ENCOUNTER — Ambulatory Visit (INDEPENDENT_AMBULATORY_CARE_PROVIDER_SITE_OTHER): Payer: Medicare Other | Admitting: Nurse Practitioner

## 2015-04-09 ENCOUNTER — Ambulatory Visit: Payer: Medicare Other | Admitting: Nurse Practitioner

## 2015-04-09 ENCOUNTER — Encounter: Payer: Self-pay | Admitting: Nurse Practitioner

## 2015-04-09 VITALS — BP 129/74 | HR 111 | Ht 63.0 in | Wt 134.5 lb

## 2015-04-09 DIAGNOSIS — G252 Other specified forms of tremor: Principal | ICD-10-CM

## 2015-04-09 DIAGNOSIS — G250 Essential tremor: Secondary | ICD-10-CM

## 2015-04-09 DIAGNOSIS — R251 Tremor, unspecified: Secondary | ICD-10-CM

## 2015-04-09 NOTE — Progress Notes (Signed)
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 She presenting with few years history of bilateral hands tremor, getting worse when she stretched out her hands, or using utensils, she has describe difficulty feeding herself, cause a lot of social embarrasement, she denies significant gait difficulty, she also has head titubation She denied bilateral lower exremity paresthesia weakness, but does complain fingertips numbness tingling. She denied loss of smell, REM sleep disorder, has anxiety symptoms, denied family history of tremor Over the past few years, we have tried primadone, while taking 50mg  1/2 once a day , there was no significant improvement at that time, she also complains of headaches, but also tried diazepam, Ativan, without significant improvement  UPDATE Sept 14th 2015: She has tried increased primidone to 50mg  tid, but complains of significant side effect, no help with her tremor, complains of drowsiness, increased anxiety, she did not try inderal 40mg  bid, worry about the side effect. She also complains of anxiety, difficulty holding forks in her hand, decreased appetite, weight loss She is no longer taking Valium, which was prescribed by her primary care before.  UPDATE Oct 08 2014: She is no longer taking primidone, she is taking clonazepam 0.5 mg half to one tablets every morning, which has helped her tremor, she complains of anxiety,  but does not want to take more medications for it  UPDATE 04/09/15 Danielle Peters, 74 year old female returns for follow-up. She has a history of essential tremor and anxiety disorder. She was last seen in this office 10/08/2014 by Dr. Krista Blue. She has tried primadone, while taking 50mg  1/2 once a day , there was no significant improvement at that time, she also complains of headaches, but also tried diazepam, Ativan, without significant improvement. She has stopped going to her exercise class. She continues to drive without difficulty. She continues to be independent in activities of daily living and her tremor does not interfere. She denies loss of sense of smell, REM sleep disorder, there is no family history of tremor. She denies any head titubation. She denies any falls. She returns for reevaluation    REVIEW OF SYSTEMS: Full 14 system review of systems performed and notable only for those listed, all others are neg:  Constitutional: neg  Cardiovascular: neg Ear/Nose/Throat: neg  Skin: neg Eyes: Itching Respiratory : neg Gastroitestinal: Frequency of urination Hematology Lymphatic: neg  Endocrine: neg Musculoskeletal:neg Allergy/Immunology: neg Neurological: Occasional headache, tremors Psychiatric : Anxiety Sleep : neg   ALLERGIES: Allergies  Allergen Reactions  . Dextromethorphan Polistirex Er Other (See Comments)    SOB  . Primidone     Severe Headaches  . Albuterol     REACTION: tachycardia  . Asa [Aspirin]     Adult dose  . Clonazepam Hives  . Loratadine   . Simvastatin     Myalgias     HOME MEDICATIONS: Outpatient Prescriptions Prior to Visit  Medication Sig Dispense Refill  . aspirin 81 MG tablet Take 81 mg by mouth every other day.     . benzonatate (TESSALON) 200 MG capsule Take 1 capsule (200  mg total) by mouth 3 (three) times daily as needed for cough. 30 capsule 6  . clonazePAM (KLONOPIN) 0.5 MG tablet Take 0.5-1 tablets (0.25-0.5 mg total) by mouth 2 (two) times  daily as needed. 60 tablet 5  . cyclobenzaprine (FLEXERIL) 10 MG tablet Take 1 tablet (10 mg total) by mouth 3 (three) times daily as needed for muscle spasms. 30 tablet 0  . hydrochlorothiazide (HYDRODIURIL) 25 MG tablet Take 0.5 tablets (12.5 mg total) by mouth daily. 45 tablet 3  . pantoprazole (PROTONIX) 40 MG tablet Take 40 mg by mouth daily.    . traMADol-acetaminophen (ULTRACET) 37.5-325 MG per tablet Take 1 tablet by mouth every 6 (six) hours as needed.    . valsartan (DIOVAN) 80 MG tablet Take 80 mg by mouth daily.    . lansoprazole (PREVACID) 30 MG capsule     . Umeclidinium Bromide (INCRUSE ELLIPTA) 62.5 MCG/INH AEPB Inhale 1 puff into the lungs daily.     No facility-administered medications prior to visit.    PAST MEDICAL HISTORY: Past Medical History  Diagnosis Date  . Cancer     breast ca  . Hypertension   . Pulmonary sarcoidosis   . Anxiety   . Esophageal reflux   . Anemia   . 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 Dyslipidemia     myaligia with statins  . Tremor     upper extremities  . Chest pain     echo 01/08/10- nl lv; mild aortic sclerosis; myoview 01/08/10- no ischemia    PAST SURGICAL HISTORY: Past Surgical History  Procedure Laterality Date  . Left breast lumpectomy,chemo  2004    xrt  . Skin biopsy    . Upper gi endoscopy  02/2015    difficulty swallowing, Abd pain  . Boil Right 03/2015    on buttock, excision    FAMILY HISTORY: No family history on file.  SOCIAL HISTORY: History   Social History  . Marital Status: Divorced    Spouse Name: N/A  . Number of Children: 2  . 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 Smoking status: Never Smoker   . Smokeless tobacco: Never Used  . Alcohol Use: No  . Drug Use: No  . Sexual Activity: Not on file   Other Topics Concern  . Not on file   Social History  Narrative   Patient lives at home alone. Patient is retired.Pateint has high school education. And went to [employer].   Right handed.   Caffeine- one cup daily.     PHYSICAL EXAM  Filed Vitals:   04/09/15 0935  BP: 129/74  Pulse: 111  Height: 5\' 3"  (1.6 m)  Weight: 134 lb 8 oz (61.009 kg)   Body mass index is 23.83 kg/(m^2). Generalized: Well developed, in no acute distress  Head: normocephalic and atraumatic,. Oropharynx benign  Neck: Supple, no carotid bruits  Cardiac: Regular rate rhythm, no murmur  Musculoskeletal: No deformity   Neurological examination   Mentation: Alert oriented to time, place, history taking. Follows all commands speech and language fluent  Cranial nerve II-XII: Pupils were equal round reactive to light extraocular movements were full, visual field were full on confrontational test. Facial sensation and strength were normal. hearing loss left ear. Uvula tongue midline. head turning and shoulder shrug and were normal and symmetric.Tongue protrusion  into cheek strength was normal. She has occasional head No-No tremor. Motor: normal bulk and tone, full strength in bilateral upper and lower extremity muscles, she has bilateral upper extremity postural tremor, right more than left, There was no significant rigidity,  Sensory: normal and symmetric to light touch, pinprick, and vibration  Coordination: finger-nose-finger, heel-to-shin bilaterally, no dysmetria Reflexes: Brachioradialis 2/2, biceps 2/2, triceps 2/2, patellar 2/2, Achilles 2/2, plantar responses were flexor bilaterally. Gait and Station: Rising up from seated position without assistance, Leaning forward, mildly unsteady, wide based no assistive device    DIAGNOSTIC DATA (LABS, IMAGING, TESTING)  ASSESSMENT AND PLAN  74 y.o. year old female  has a past medical history of essential tremor, no Parkinson's features on exam.  Continue clonazepam one half tab twice a day does not need  refills Continue going to your exercise class this will help your anxiety disorder and her overall health and well-being Follow-up in 6 months Dennie Bible, [facility], [facility], [facility] [address], [place], [place] [address] [phone]

## 2015-04-09 NOTE — Patient Instructions (Signed)
Continue clonazepam one half tab twice a day does not need refills Continue going to your exercise class this will help your anxiety disorder Follow-up in 6 months

## 2015-04-16 NOTE — Progress Notes (Signed)
I have reviewed and agreed above plan. 

## 2015-04-17 ENCOUNTER — Other Ambulatory Visit: Payer: Self-pay

## 2015-04-17 DIAGNOSIS — Z1231 Encounter for screening mammogram for malignant neoplasm of breast: Secondary | ICD-10-CM

## 2015-04-22 ENCOUNTER — Encounter: Payer: Self-pay | Admitting: Internal Medicine

## 2015-04-22 DIAGNOSIS — J449 Chronic obstructive pulmonary disease, unspecified: Secondary | ICD-10-CM | POA: Insufficient documentation

## 2015-04-22 NOTE — Assessment & Plan Note (Signed)
I emphasized importance of control to prevent reflux which would sharply aggravate her chest symptoms.

## 2015-04-22 NOTE — Assessment & Plan Note (Signed)
Her complaints have varied over time. I don't think this is angina or bone metastases because of the marked variation.

## 2015-04-22 NOTE — Assessment & Plan Note (Signed)
Close to baseline. There is some variation from visit to visit, partly related to weather etc. but no real trend. Bronchodilators exacerbate her tremor. Sarcoid and radiation therapy with a main causes. Plan-benzonatate Perles

## 2015-05-24 ENCOUNTER — Ambulatory Visit
Admission: RE | Admit: 2015-05-24 | Discharge: 2015-05-24 | Disposition: A | Discharge status: Home / Self Care | Payer: Medicare Other | Source: Ambulatory Visit

## 2015-05-24 DIAGNOSIS — Z1231 Encounter for screening mammogram for malignant neoplasm of breast: Secondary | ICD-10-CM

## 2015-05-27 ENCOUNTER — Other Ambulatory Visit: Payer: Self-pay | Admitting: Internal Medicine

## 2015-05-27 DIAGNOSIS — R928 Other abnormal and inconclusive findings on diagnostic imaging of breast: Secondary | ICD-10-CM

## 2015-05-31 ENCOUNTER — Ambulatory Visit
Admission: RE | Admit: 2015-05-31 | Discharge: 2015-05-31 | Disposition: A | Discharge status: Home / Self Care | Payer: Medicare Other | Source: Ambulatory Visit | Attending: Internal Medicine | Admitting: Internal Medicine

## 2015-05-31 DIAGNOSIS — R928 Other abnormal and inconclusive findings on diagnostic imaging of breast: Secondary | ICD-10-CM

## 2015-06-06 ENCOUNTER — Ambulatory Visit: Payer: Medicare Other | Admitting: Podiatry

## 2015-06-20 ENCOUNTER — Encounter: Payer: Self-pay | Admitting: Cardiovascular Disease

## 2015-06-20 ENCOUNTER — Ambulatory Visit (INDEPENDENT_AMBULATORY_CARE_PROVIDER_SITE_OTHER): Payer: Medicare Other | Admitting: Cardiovascular Disease

## 2015-06-20 VITALS — BP 132/68 | HR 107 | Resp 16 | Ht 63.0 in | Wt 137.0 lb

## 2015-06-20 DIAGNOSIS — I10 Essential (primary) hypertension: Secondary | ICD-10-CM | POA: Diagnosis not present

## 2015-06-20 DIAGNOSIS — I471 Supraventricular tachycardia: Secondary | ICD-10-CM

## 2015-06-20 DIAGNOSIS — D869 Sarcoidosis, unspecified: Secondary | ICD-10-CM | POA: Diagnosis not present

## 2015-06-20 NOTE — Progress Notes (Signed)
Patient ID: Danielle Peters, female   DOB: [birth year], 74 y.o.   MRN: [id number]     Cardiology Office Note   Date:  06/20/2015   ID:  Danielle Peters, DOB 1941-01-27, MRN [id number]  PCP:  Danielle Ringer, MD  Cardiologist:   Danielle Klein, MD   Chief Complaint  Patient presents with  . Annual Exam    Patient feels light headed, dizzy, and her chest aches at times.      History of Present Illness: Danielle Peters is a 74 y.o. 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 Previous assessment has shown normal left ventricular systolic dysfunction and no evidence of ischemia by nuclear stress testing.  She denies any palpitations. Her only cardiovascular complaint is lightheadedness. She complains of a constant aching sensation in her left chest  And left shoulderthat is independent of physical activity. She is able to participate in aerobics exercises without any change in the discomfort. It has been present there for months or years.   She has baseline mild exertional dyspnea. She denies syncope. She continues to have persistent upper extremity tremor but she seems to be handling this better from an emotional standpoint.  She has a history of intermittent left bundle branch block. She has previously undergone left breast lumpectomy and chemotherapy for cancer.  Past Medical History  Diagnosis Date  . Cancer     breast ca  . Hypertension   . Pulmonary sarcoidosis   . Anxiety   . Esophageal reflux   . Anemia   . 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 Dyslipidemia     myaligia with statins  . Tremor     upper extremities  . Chest pain     echo 01/08/10- nl lv; mild aortic sclerosis; myoview 01/08/10- no ischemia    Past Surgical  History  Procedure Laterality Date  . Left breast lumpectomy,chemo  2004    xrt  . Skin biopsy    . Upper gi endoscopy  02/2015    difficulty swallowing, Abd pain  . Boil Right 03/2015    on buttock, excision     Current Outpatient Prescriptions  Medication Sig Dispense Refill  . aspirin 81 MG tablet Take 81 mg by mouth every other day.     . benzonatate (TESSALON) 200 MG capsule Take 1 capsule (200 mg total) by mouth 3 (three) times daily as needed for cough. 30 capsule 6  . clonazePAM (KLONOPIN) 0.5 MG tablet Take 0.5-1 tablets (0.25-0.5 mg total) by mouth 2 (two) times daily as needed. 60 tablet 5  . cyclobenzaprine (FLEXERIL) 10 MG tablet Take 1 tablet (10 mg total) by mouth 3 (three) times daily as needed for muscle spasms. 30 tablet 0  . pantoprazole (PROTONIX) 40 MG tablet Take 40 mg by mouth daily.    . traMADol-acetaminophen (ULTRACET) 37.5-325 MG per tablet Take 1 tablet by mouth every 6 (six) hours as needed.    . valsartan-hydrochlorothiazide (DIOVAN-HCT) 80-12.5 MG per tablet Take 1 tablet by mouth daily.  4   No current facility-administered medications for this visit.    Allergies:   Dextromethorphan polistirex er; Primidone; Albuterol; Asa; Loratadine; and Simvastatin    Social History:  The patient  reports that she has never smoked. She has never used smokeless  tobacco. She reports that she does not drink alcohol or use illicit drugs.   ROS:  Please see the history of present illness.    Otherwise, review of systems positive for none.   All other systems are reviewed and negative.    PHYSICAL EXAM: VS:  BP 132/68 mmHg  Pulse 107  Resp 16  Ht 5\' 3"  (1.6 m)  Wt 137 lb (62.143 kg)  BMI 24.27 kg/m2 , BMI Body mass index is 24.27 kg/(m^2).  General: Alert, oriented x3, no distress Head: no evidence of trauma, PERRL, EOMI, no exophtalmos or lid lag, no myxedema, no xanthelasma; normal ears, nose and oropharynx Neck: normal jugular venous pulsations and no  hepatojugular reflux; brisk carotid pulses without delay and no carotid bruits Chest:  Bilateral "squeaky" rales in both bases, no signs of consolidation by percussion or palpation, normal fremitus, symmetrical and full respiratory excursions Cardiovascular: normal position and quality of the apical impulse, regular rhythm, normal first and second heart sounds, no murmurs, rubs or gallops Abdomen: no tenderness or distention, no masses by palpation, no abnormal pulsatility or arterial bruits, normal bowel sounds, no hepatosplenomegaly Extremities: no clubbing, cyanosis or edema; 2+ radial, ulnar and brachial pulses bilaterally; 2+ right femoral, posterior tibial and dorsalis pedis pulses; 2+ left femoral, posterior tibial and dorsalis pedis pulses; no subclavian or femoral bruits Neurological: grossly nonfocal except moderate resting tremor of the hands Psych: euthymic mood, full affect   EKG:  EKG is ordered today. The ekg ordered today demonstrates  Sinus tachycardia, possible right atrial abnormality, otherwise normal   Recent Labs:  labs from last year did not show evidence of anemia or hyperthyroidism. She is due to have labs again next week with Dr. Dagmar Peters.  Wt Readings from Last 3 Encounters:  06/20/15 137 lb (62.143 kg)  04/09/15 134 lb 8 oz (61.009 kg)  03/27/15 134 lb (60.782 kg)     ASSESSMENT AND PLAN:  No evidence of recurrent supraventricular tachycardia. Lung problems seem to be stable.  The identifiable abnormality is mild resting tachycardia for which I do not find a good explanation.  She is no longer taking broncho-dilators. By physical exam and most recent labs does not have anemia or hyperthyroidism,  But these problems should be reevaluated.  Does not appear to be clinically hypovolemic and is on a tiny dose of a weak diuretic for blood pressure , with good blood pressure control.  Previous left bundle branch block is not seen on today's electrocardiogram. I doubt that  further evaluation for heart disease will explain her tachycardia, which I think is physiological/reactive.  Current medicines are reviewed at length with the patient today.  The patient does not have concerns regarding medicines.  The following changes have been made:  no change  Labs/ tests ordered today include:  No orders of the defined types were placed in this encounter.     Patient Instructions  Dr. Sallyanne Kuster recommends that you schedule a follow-up appointment in: ONE YEAR       SignedSanda Klein, MD  06/20/2015 8:29 AM    Danielle Klein, MD, [facility] [phone] office [phone] pager

## 2015-06-20 NOTE — Patient Instructions (Signed)
Dr. Croitoru recommends that you schedule a follow-up appointment in: ONE YEAR   

## 2015-07-03 ENCOUNTER — Other Ambulatory Visit: Payer: Self-pay | Admitting: Internal Medicine

## 2015-07-17 ENCOUNTER — Encounter: Payer: Self-pay | Admitting: Podiatry

## 2015-07-17 ENCOUNTER — Ambulatory Visit (INDEPENDENT_AMBULATORY_CARE_PROVIDER_SITE_OTHER): Payer: Medicare Other | Admitting: Podiatry

## 2015-07-17 DIAGNOSIS — M79673 Pain in unspecified foot: Secondary | ICD-10-CM

## 2015-07-17 DIAGNOSIS — B351 Tinea unguium: Secondary | ICD-10-CM

## 2015-07-17 NOTE — Progress Notes (Signed)
Patient ID: Danielle Peters, female   DOB: 09-09-1941, 74 y.o.   MRN: [id number] Complaint:  Visit Type: [name] for continued preventative foot care services. Complaint: Patient states" my nails have grown long and thick and become painful to walk and wear shoes" . The patient presents for preventative foot care services. No changes to ROS  Podiatric Exam: Vascular: dorsalis pedis and posterior tibial pulses are palpable bilateral. Capillary return is immediate. Temperature gradient is WNL. Skin turgor WNL  Sensorium: Normal Semmes Weinstein monofilament test. Normal tactile sensation bilaterally. Nail Exam: Pt has thick disfigured discolored nails with subungual debris noted bilateral entire nail hallux through fifth toenails Ulcer Exam: There is no evidence of ulcer or pre-ulcerative changes or infection. Orthopedic Exam: Muscle tone and strength are WNL. No limitations in general ROM. No crepitus or effusions noted. Foot type and digits show no abnormalities. Bony prominences are unremarkable. Skin: No Porokeratosis. No infection or ulcers  Diagnosis:  Onychomycosis, , Pain in right toe, pain in left toes  Treatment & Plan Procedures and Treatment: Consent by patient was obtained for treatment procedures. The patient understood the discussion of treatment and procedures well. All questions were answered thoroughly reviewed. Debridement of mycotic and hypertrophic toenails, 1 through 5 bilateral and clearing of subungual debris. No ulceration, no infection noted.  Return Visit-Office Procedure: Patient instructed to return to the office for a follow up visit 3 months for continued evaluation and treatment.

## 2015-08-21 ENCOUNTER — Encounter: Payer: Self-pay | Admitting: Internal Medicine

## 2015-08-21 ENCOUNTER — Ambulatory Visit (INDEPENDENT_AMBULATORY_CARE_PROVIDER_SITE_OTHER): Payer: Medicare Other | Admitting: Internal Medicine

## 2015-08-21 VITALS — BP 124/66 | HR 86 | Ht 63.0 in | Wt 140.4 lb

## 2015-08-21 DIAGNOSIS — Z23 Encounter for immunization: Secondary | ICD-10-CM

## 2015-08-21 DIAGNOSIS — D869 Sarcoidosis, unspecified: Secondary | ICD-10-CM | POA: Diagnosis not present

## 2015-08-21 DIAGNOSIS — I471 Supraventricular tachycardia: Secondary | ICD-10-CM

## 2015-08-21 DIAGNOSIS — J441 Chronic obstructive pulmonary disease with (acute) exacerbation: Secondary | ICD-10-CM

## 2015-08-21 MED ORDER — METHYLPREDNISOLONE ACETATE 80 MG/ML IJ SUSP
80.0000 mg | Freq: Once | INTRAMUSCULAR | Status: AC
  Administered 2015-08-21: 80 mg via INTRAMUSCULAR

## 2015-08-21 MED ORDER — AZITHROMYCIN 250 MG PO TABS: ORAL_TABLET | ORAL | Status: DC

## 2015-08-21 NOTE — Patient Instructions (Addendum)
Script sent to drug store for Zpak for bronchitis  Depo 24  Prevnar 13 pneumonia vaccine

## 2015-08-21 NOTE — Progress Notes (Signed)
07/23/11- 74 year old female never smoker followed for sarcoid, chronic bronchitis complicated by history of right breast cancer, tachycardia, GERD, anxiety, glaucoma.. Last here- 12/29/2010. She is here today with concern about an intermittent pain at the inferior aspect of her right scapula, noticed over the past month. It is increased by stress. She also feels tender across the lower left inferior costal margin and she notices occasional heartburn. She recently restarted exercise classes at the [facility]. She denies fever productive cough sore throat nodes or exertional chest pain. She recently had cardiology followup at [facility] where she was told heart problems might be from sarcoid. She doesn't understand anything specific about that.  09/03/11-  74 year old female never smoker followed for sarcoid, chronic bronchitis complicated by history of right breast cancer, tachycardia, GERD, anxiety, glaucoma.. She has felt well with no acute issues. She does comment on annoying tremor of her hands. Treated by neurology with primidone which caused headache and rapid heart rate. Cold air increased her cough a little bit. She does not think she has an infection and denies fever, sweats, swollen glands, pain or purulent discharge. ACE level -07/23/11- 61 which may indicate low grade activity despite her age. CXR- 07/23/2011-patchy density right middle lobe, suspected to be sarcoid by the radiologist. We discussed the fact that this is in the field of radiation therapy for right breast cancer and may be radiation change in the underlying lung.  12/04/11-  73 year old female never smoker followed for sarcoid, chronic bronchitis complicated by history of right breast cancer, tachycardia, GERD, anxiety, glaucoma.[name]  PCP Dr Dagmar Hait She keeps a mild cough which she feels in the left upper chest, productive of small amounts of white sputum with no pain fever or blood. Lites night sweats especially  if she is before bedtime.  Cough syrup causes "funny breathing", without exertional dyspnea. She does aerobics at the [employer]. With exercise she gets an ache in the left upper chest which will persist for about a day. Continues Timoptic eyedrops for glaucoma.  01/01/12- 74 year old female never smoker followed for sarcoid, chronic bronchitis complicated by history of right breast cancer, tachycardia, GERD, anxiety, glaucoma.[name]  PCP Dr Dagmar Hait CXR 12/08/11- images reviewed with her Comparison: Chest x-ray of 07/23/2011  Findings: Vague opacity in the right mid lung and hilar and  mediastinal adenopathy is stable in this patient with a history of  sarcoidosis. No definite active process is seen. Heart size is  stable. No bony abnormality is noted.  IMPRESSION:  Stable hilar - medial and adenopathy with the opacity in the right  mid lung in this patient with sarcoidosis. No active process.  Original Report Authenticated By: Joretta Bachelor, M.D.  Still coughs, white phlegm or dry. Primidone was changed to diastolic for tremor without increased chest tightness or wheeze. Continues timolol for glaucoma. PFT-12/21/11- normal spirometry with insignificant response to bronchodilator, normal lung volumes, diffusion at lower limits of normal. 6 minute walk test-96%, 96%, 99%, 324 m. Excellent oxygenation with no desaturation during this exercise.  02/09/12- 74 year old female never smoker followed for sarcoid, chronic bronchitis complicated by history of right breast cancer, tachycardia, GERD, anxiety, glaucoma.[name]  PCP Dr Dagmar Hait No longer feels she needs Qvar inhaler. No longer needs benzonatate Perles. Not much cough. She did not need to ask her eye doctor about Timolol eyedrops because she was not coughing. Asks about a tender point at the left lower costal margin Last ACE level 12/04/11- 5(L)  12/19/12- 74 year old female never smoker followed  for sarcoid, chronic bronchitis complicated by history of right  breast cancer, tachycardia, GERD, anxiety, glaucoma.[name]  PCP Dr Dagmar Hait ACUTE VISIT: recently dx'd by  Dr. Dagmar Hait with PNA; wanted to be rechecked by CY and at the request of Dr. Dagmar Hait. His CXR report not yet available.  She had incr cough with white sputum, no fever starting 2 weeks ago. Finished 7 days of levaquin 5 days ago. Now little dry cough. Using Qvar- had been wheezing. Feels washed out. Bilateral lower rib ache from cough. CXR 12/08/11 IMPRESSION:  Stable hilar - medial and adenopathy with the opacity in the right  mid lung in this patient with sarcoidosis. No active process.  Original Report Authenticated By: Joretta Bachelor, M.D.  03/21/13- 74 year old female never smoker followed for sarcoid, chronic bronchitis complicated by history of right breast cancer/ XRT, tachycardia, GERD, anxiety, glaucoma.[name]  PCP Dr Dagmar Hait Husband just died of lung cancer She had Valium but dislikes the rebound and asks if I could give her something for anxiety for a few days. Breathing has been stable with some cough, no sputum, no fever. CXR 12/21/12 IMPRESSION:  No active disease. Stable hilar adenopathy. Again noted post  radiation changes in right upper lobe.  Original Report Authenticated By: Lahoma Crocker, M.D.  06/21/13- 74 year old female never smoker followed for sarcoid, chronic bronchitis complicated by history of right breast cancer/ XRT, tachycardia, GERD, anxiety, glaucoma.[name]  PCP Dr Dagmar Hait  FOLLOWS FOR:  Dry cough. Denies wheezing or SOB otherwise unchanged since last OV Denies change in her dry cough without significant shortness of breath.  Valium did not help her tremor-has a neurology appointment. New complaint of diffuse itching especially upper chest and hands without rash. Tried Aquaphor. Wants to delay flu vaccine.  11/10/13- 74 year old female never smoker followed for sarcoid, chronic bronchitis complicated by history of right breast cancer/ XRT, tachycardia, GERD, anxiety, glaucoma, tremor.[name]  PCP Dr  Dagmar Hait ACUTE VISIT: has been having chest pain x 2 weeks and breathing hard. Runny nose and eyes as well. Seen by PCP-EKG neg. Patient describes aching pain left anterior chest wall, originally intermittent and now constant. PCP did swab negative for flu and recommended Tylenol. She took tramadol for headache, and says that's when the chest started hurting. Tremor is bothering her more. Neurologist in November restarted primidone which has not helped, although she admits not following directions. She does not plan to followup with that neurologist.  01/08/14- 74 year old female never smoker followed for sarcoid, chronic bronchitis complicated by history of right breast cancer/ XRT, tachycardia, GERD, anxiety, glaucoma, tremor.[name]  PCP Dr Dagmar Hait FOLLOWS FOR:  Breathing doing well, some wheezing-Began Breo Ellipta-reports does not like. CXR 11/10/13- IMPRESSION:  1. There is hyperinflation consistent with COPD. Stable increased  interstitial markings in the right perihilar region may be related  to the patient's sarcoidosis for reflect scarring.  2. There is stable enlargement of hilar lymph nodes bilaterally.  There is no evidence of CHF nor acute alveolar pneumonia.  3. Chest CT scanning may be useful if the patient's symptoms  persist.  Electronically Signed  By: David Martinique  On: 11/10/2013 11:36  01/25/14- 74 year old female never smoker followed for sarcoid, chronic bronchitis complicated by history of right breast cancer/ XRT, tachycardia, GERD, anxiety, glaucoma, tremor.[name]  PCP Dr Dagmar Hait ACUTE VISIT: continues to cough-not any better per patient. Completed the Zpak and phlegm is not colored now.  Denies any wheezing or SOB. Having dark spots in roof of mouth Did not like Breo.  05/10/14- 74 year old female never smoker followed for sarcoid, chronic bronchitis complicated by history of right breast cancer/ XRT, tachycardia, GERD, anxiety, glaucoma, tremor.[name]  PCP Dr Dagmar Hait FOLLOWS FOR:  Reports some sob  and cough with white mucus   08/09/14- 74 year old female never smoker followed for sarcoid, chronic bronchitis complicated by history of right breast cancer/ XRT, tachycardia, GERD, anxiety, glaucoma, tremor.[name]  PCP Dr Dagmar Hait FOLLOW FOR:  Sarcoidosis; Coughing up thick white mucus, chest pain Constant ache across upper anterior chest x 2-3 weeks. Symbicort gives sore throat and jitters. No fever, blood, exertional chest pain or radiation.  09/10/14- 74 year old female never smoker followed for sarcoid, chronic bronchitis complicated by history of right breast cancer/ XRT, tachycardia, GERD, anxiety, glaucoma, tremor.[name]  PCP Dr Dagmar Hait Coughing white- she considers this her baseline cough. Declines flu vaccine Chest pain better compared with last visit and she now only notices a tenderness upper left anterior chest about 2 cm diameter area above the breast. Also has some chronic aching across mid back which is not new.  12/10/14- 74 year old female never smoker followed for sarcoid, chronic bronchitis complicated by history of right breast cancer/ XRT, tachycardia, GERD, anxiety, glaucoma, tremor.[name]  PCP Dr Beverlyn Roux for: week ago, CP w/LT arm pain, achy feeling in arm and chest; ache across shoulder blades; tingling in fingers worse during this time.  Shifting anterior chest pains seem not related to pains in her left upper arm. The arm pains are probably not affected by use or movement. Fingers have been tingling "quite a while" which she thinks may be related to her prior chemotherapy. She asks treatment with a muscle relaxer like Flexeril. CXR 09/10/14-  IMPRESSION: 1. Pleural parenchymal scarring right lung, no change. 2. No acute cardiopulmonary disease. 3. Prior right axillary dissection. Electronically Signed  By: Marcello Moores Register  On: 09/10/2014 10:10 CXR 10/31/14 Disc from [facility] w/o report. Image looks stable, unchanged scarring R.  03/27/15- 74 year old female never smoker followed  for sarcoid, chronic bronchitis complicated by history of right breast cancer/ XRT, tachycardia, GERD, anxiety, glaucoma, tremor.[name]  PCP Dr Dagmar Hait Reports:Pt. states that she is doing well since last visit. Pt. is having cough no mucus, chest will hurt at times but she can tolerate it.. [facility]/ GI did an endoscopy. Stable persistent dyspnea "breathing hard". Forced deep breath sometimes hurts left lower anterior costal margin-not new. Chest muscles a little sore recently from exercise.  08/21/15-  74 year old female never smoker followed for sarcoid, chronic bronchitis complicated by history of right breast cancer/ XRT, tachycardia, GERD, anxiety, glaucoma, tremor.[name]  PCP Dr Dagmar Hait Pt c/o prod cough (thick white phlem), congestion, PND. no f/c/s/n/v. Acute illness began 2 weeks ago with sore throat. Declines flu vaccine Had Pneumovax 23 in 2012  ROS- see HPI Constitutional:   No-   weight loss, night sweats, fevers, chills,+ fatigue, lassitude. HEENT:   No-  headaches, difficulty swallowing, tooth/dental problems, sore throat,       No-  sneezing, itching, ear ache, nasal congestion, post nasal drip,  CV:  + chest pain, orthopnea, PND, swelling in lower extremities, anasarca, dizziness, palpitations Resp: No-   shortness of breath with exertion or at rest.             + productive cough,  + non-productive cough,  No-  coughing up of blood.              No-   change in color of mucus.  No- wheezing.   Skin: no-pruritus or rash  GI:  No-   heartburn, indigestion, abdominal pain, nausea, vomiting,  GU:. MS:  No-   joint pain or swelling.  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 No depression or anxiety.  No memory loss.  Obj General- Alert, Oriented, Affect-appropriate, Distress- none acute                    + Always slumped, almost kyphotic posture Skin- rash-none, lesions- none, excoriation- none Lymphadenopathy- none Head- atraumatic             Eyes- Gross vision intact, PERRLA, conjunctivae clear secretions            Ears- Hearing, canals-normal            Nose- Clear, no-Septal dev, mucus, polyps, erosion, perforation             Throat- Mallampati II , mucosa clear , drainage- none, tonsils- atrophic. Missing teeth Neck- flexible , trachea midline, no stridor , thyroid nl, carotid no bruit Chest - symmetrical excursion , unlabored           Heart/CV- RRR with no extra beats , no murmur , no gallop  , no rub, nl s1 s2                - JVD- none , edema- none, stasis changes- none, varices- none           Lung- + bilateral mild rhonchi in bases,  Work of breathing is not increased.   cough- none , dullness-none, rub- none           Chest wall- treatment Scar R breast, L upper anterior chest,   Abd- No HSM Br/ Gen/ Rectal- Not done, not indicated Extrem- cyanosis- none, clubbing, none, atrophy- none, strength- nl Neuro- +Tremor .

## 2015-08-25 NOTE — Assessment & Plan Note (Signed)
By examination, rhythm at this visit is regular consistent with sinus

## 2015-08-25 NOTE — Assessment & Plan Note (Signed)
Acute respiratory infection, nonspecific Plan-Z-Pak, Depo-Medrol, discussion of flu shot

## 2015-08-25 NOTE — Assessment & Plan Note (Signed)
Long-term remission with residual scarring

## 2015-09-18 ENCOUNTER — Telehealth: Payer: Self-pay | Admitting: Internal Medicine

## 2015-09-18 MED ORDER — PROMETHAZINE-CODEINE 6.25-10 MG/5ML PO SYRP: 5.0000 mL | ORAL_SOLUTION | Freq: Four times a day (QID) | ORAL | Status: DC | PRN

## 2015-09-18 NOTE — Telephone Encounter (Signed)
Spoke with pt. States that she is already taking Tramdol (it is on her medication list). Would like another alternative.  CY - please advise. Thanks.

## 2015-09-18 NOTE — Telephone Encounter (Signed)
Called spoke with pt. She is requesting something for her cough. She has been taking tess pearls w/o relief. She coughs up thick white phlem Please advise Dr. Annamaria Boots thanks  Allergies  Allergen Reactions  . Dextromethorphan Polistirex Er Other (See Comments)    SOB  . Primidone     Severe Headaches  . Albuterol     REACTION: tachycardia  . Asa [Aspirin]     Adult dose  . Loratadine   . Simvastatin     Myalgias      Current Outpatient Prescriptions on File Prior to Visit  Medication Sig Dispense Refill  . aspirin 81 MG tablet Take 81 mg by mouth every other day.     [name] azithromycin (ZITHROMAX) 250 MG tablet 2 today then one daily 6 each 0  . benzonatate (TESSALON) 200 MG capsule Take 1 capsule (200 mg total) by mouth 3 (three) times daily as needed for cough. 30 capsule 6  . clonazePAM (KLONOPIN) 0.5 MG tablet Take 0.5-1 tablets (0.25-0.5 mg total) by mouth 2 (two) times daily as needed. 60 tablet 5  . cyclobenzaprine (FLEXERIL) 10 MG tablet Take 1 tablet (10 mg total) by mouth 3 (three) times daily as needed for muscle spasms. 30 tablet 0  . pantoprazole (PROTONIX) 40 MG tablet Take 40 mg by mouth daily.    . traMADol-acetaminophen (ULTRACET) 37.5-325 MG per tablet Take 1 tablet by mouth every 6 (six) hours as needed.    . valsartan-hydrochlorothiazide (DIOVAN-HCT) 80-12.5 MG per tablet Take 1 tablet by mouth daily.  4   No current facility-administered medications on file prior to visit.

## 2015-09-18 NOTE — Telephone Encounter (Signed)
Called spoke with pt. Aware of recs. RX called in. Nothing further needed 

## 2015-09-18 NOTE — Telephone Encounter (Signed)
Ok- offer prometh codeine cough syrup, 200 ml, 5 ml every 6 hours if needed for cough

## 2015-09-18 NOTE — Telephone Encounter (Signed)
Offer tramadol 50 mg, # 40, 1 every 6 hours if needed for cough

## 2015-09-26 ENCOUNTER — Ambulatory Visit: Payer: Medicare Other | Admitting: Internal Medicine

## 2015-10-02 ENCOUNTER — Ambulatory Visit (INDEPENDENT_AMBULATORY_CARE_PROVIDER_SITE_OTHER): Payer: Medicare Other | Admitting: Podiatry

## 2015-10-02 DIAGNOSIS — M79673 Pain in unspecified foot: Secondary | ICD-10-CM

## 2015-10-02 DIAGNOSIS — B351 Tinea unguium: Secondary | ICD-10-CM | POA: Diagnosis not present

## 2015-10-02 NOTE — Progress Notes (Signed)
Patient ID: Danielle Peters, female   DOB: 1941/06/09, 74 y.o.   MRN: [id number] Complaint:  Visit Type: [name] for continued preventative foot care services. Complaint: Patient states" my nails have grown long and thick and become painful to walk and wear shoes" . The patient presents for preventative foot care services. No changes to ROS  Podiatric Exam: Vascular: dorsalis pedis and posterior tibial pulses are palpable bilateral. Capillary return is immediate. Temperature gradient is WNL. Skin turgor WNL  Sensorium: Normal Semmes Weinstein monofilament test. Normal tactile sensation bilaterally. Nail Exam: Pt has thick disfigured discolored nails with subungual debris noted bilateral entire nail hallux through fifth toenails Ulcer Exam: There is no evidence of ulcer or pre-ulcerative changes or infection. Orthopedic Exam: Muscle tone and strength are WNL. No limitations in general ROM. No crepitus or effusions noted. Foot type and digits show no abnormalities. Bony prominences are unremarkable. Skin: No Porokeratosis. No infection or ulcers  Diagnosis:  Onychomycosis, , Pain in right toe, pain in left toes  Treatment & Plan Procedures and Treatment: Consent by patient was obtained for treatment procedures. The patient understood the discussion of treatment and procedures well. All questions were answered thoroughly reviewed. Debridement of mycotic and hypertrophic toenails, 1 through 5 bilateral and clearing of subungual debris. No ulceration, no infection noted.  Return Visit-Office Procedure: Patient instructed to return to the office for a follow up visit 3 months for continued evaluation and treatment.  Gardiner Barefoot DPM

## 2015-10-03 ENCOUNTER — Encounter: Payer: Self-pay | Admitting: Nurse Practitioner

## 2015-10-03 ENCOUNTER — Ambulatory Visit (INDEPENDENT_AMBULATORY_CARE_PROVIDER_SITE_OTHER): Payer: Medicare Other | Admitting: Nurse Practitioner

## 2015-10-03 VITALS — BP 145/77 | HR 111 | Ht 63.0 in | Wt 138.8 lb

## 2015-10-03 DIAGNOSIS — G250 Essential tremor: Secondary | ICD-10-CM | POA: Diagnosis not present

## 2015-10-03 NOTE — Patient Instructions (Signed)
Decrease Klonipin to 1/2 tab for two weeks then discontinue. F/U in 6 months

## 2015-10-03 NOTE — Progress Notes (Signed)
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 She presenting with few years history of bilateral hands tremor, getting worse when she stretched out her hands, or using utensils, she has describe difficulty feeding herself, cause a lot of social embarrasement, she denies significant gait difficulty, she also has head titubation She denied bilateral lower exremity paresthesia weakness, but does complain fingertips numbness tingling. She denied loss of smell, REM sleep disorder, has anxiety symptoms, denied family history of tremor Over the past few years, we have tried primadone, while taking 50mg  1/2 once a day , there was no significant improvement at that time, she also complains of headaches, but also tried diazepam, Ativan, without significant improvement UPDATE Oct 08 2014: She is no longer taking primidone, she is taking clonazepam 0.5 mg half to one tablets every morning, which has helped her tremor, she complains of anxiety, but does not want to take more medications for it  UPDATE 10/03/15 Danielle Peters, 74 year old female returns for follow-up. She has a history of essential tremor and anxiety disorder. She was last seen in this office 04/09/15. She has tried primadone, while taking 50mg  1/2 once a day , there was no significant improvement at that time, she also complains of headaches, but also tried diazepam, Ativan, without significant improvement. She has not tried  Inderal 40 mg twice a day as she worries about side effects.Most recently she is on Klonopin .5mg  1-2 times daily. She thinks it is causing her to itch or that she might have an allergy to this medication. She has stopped going to her exercise class. She continues to drive without difficulty. She continues to be independent in activities of daily living and her tremor does not interfere. She denies loss of sense of smell, REM sleep disorder, there is no family history of tremor. She denies any head titubation. She denies any falls. She returns for reevaluation    REVIEW OF SYSTEMS: Full 14 system review of systems performed and notable only for those listed, all others are neg:  Constitutional: neg  Cardiovascular: neg Ear/Nose/Throat: hearing loss  Skin: neg Eyes: neg Respiratory: neg Gastroitestinal: neg  Hematology/Lymphatic: neg  Endocrine: neg Musculoskeletal:neg Allergy/Immunology: neg Neurological: tremors Psychiatric: neg Sleep : neg   ALLERGIES: Allergies  Allergen Reactions  . Dextromethorphan Polistirex Er Other (See Comments)    SOB  . Primidone     Severe Headaches  . Albuterol     REACTION: tachycardia  . Asa [Aspirin]     Adult dose  . Loratadine   . Simvastatin     Myalgias     HOME MEDICATIONS: Outpatient Prescriptions Prior to Visit  Medication Sig Dispense Refill  . aspirin 81 MG tablet Take 81 mg by mouth every other day.     . benzonatate (TESSALON) 200 MG capsule Take 1 capsule (200 mg total) by mouth 3 (three) times daily as needed for cough. 30 capsule 6  . clonazePAM (KLONOPIN) 0.5 MG tablet Take 0.5-1 tablets (0.25-0.5 mg total) by mouth 2 (two) times daily as needed. 60 tablet 5  . pantoprazole (PROTONIX) 40  MG tablet Take 40 mg by mouth daily.    . traMADol-acetaminophen (ULTRACET) 37.5-325 MG per tablet Take 1 tablet by mouth every 6 (six) hours as needed.    . valsartan-hydrochlorothiazide (DIOVAN-HCT) 80-12.5 MG per tablet Take 1 tablet by  mouth daily.  4  . azithromycin (ZITHROMAX) 250 MG tablet 2 today then one daily (Patient not taking: Reported on 10/03/2015) 6 each 0  . cyclobenzaprine (FLEXERIL) 10 MG tablet Take 1 tablet (10 mg total) by mouth 3 (three) times daily as needed for muscle spasms. 30 tablet 0  . promethazine-codeine (PHENERGAN WITH CODEINE) 6.25-10 MG/5ML syrup Take 5 mLs by mouth every 6 (six) hours as needed for cough. 200 mL 0   No facility-administered medications prior to visit.    PAST MEDICAL HISTORY: Past Medical History  Diagnosis Date  . Cancer ([place])     breast ca  . Hypertension   . Pulmonary sarcoidosis ([place])   . Anxiety   . Esophageal reflux   . Anemia   . 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 Dyslipidemia     myaligia with statins  . Tremor     upper extremities  . Chest pain     echo 01/08/10- nl lv; mild aortic sclerosis; myoview 01/08/10- no ischemia    PAST SURGICAL HISTORY: Past Surgical History  Procedure Laterality Date  . Left breast lumpectomy,chemo  2004    xrt  . Skin biopsy    . Upper gi endoscopy  02/2015    difficulty swallowing, Abd pain  . Boil Right 03/2015    on buttock, excision    FAMILY HISTORY: History reviewed. No pertinent family history.  SOCIAL HISTORY: Social History   Social History  . Marital Status: Divorced    Spouse Name: N/A  . Number of Children: 2  . 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 Smoking status: Never Smoker   . Smokeless tobacco: Never Used  . Alcohol Use: No  . Drug Use: No  . Sexual Activity: Not on file   Other Topics Concern  . Not on file   Social History Narrative   Patient lives at home alone. Patient is retired.Pateint has high school education. And went to [employer].   Right handed.   Caffeine- one cup daily.     PHYSICAL EXAM  Filed Vitals:    10/03/15 1040  BP: 145/77  Pulse: 111  Height: 5\' 3"  (1.6 m)  Weight: 138 lb 12.8 oz (62.959 kg)   Body mass index is 24.59 kg/(m^2). Generalized: Well developed, in no acute distress  Head: normocephalic and atraumatic,. Oropharynx benign  Neck: Supple, no carotid bruits  Cardiac: Regular rate rhythm, no murmur  Musculoskeletal: No deformity   Neurological examination   Mentation: Alert oriented to time, place, history taking. Follows all commands speech and language fluent  Cranial nerve II-XII: Pupils were equal round reactive to light extraocular movements were full, visual field were full on confrontational test. Facial sensation and strength were normal. hearing loss left ear. Uvula tongue midline. head turning and shoulder shrug and were normal and symmetric.Tongue protrusion into cheek strength was normal. She has occasional head No-No tremor. Motor: normal bulk and tone, full strength in bilateral upper and lower extremity muscles, she has bilateral upper extremity postural tremor, right more than left, There was no  significant rigidity,  Sensory: normal and symmetric to light touch, pinprick, and vibration  Coordination: finger-nose-finger, heel-to-shin bilaterally, no dysmetria Reflexes: Brachioradialis 2/2, biceps 2/2, triceps 2/2, patellar 2/2, Achilles 2/2, plantar responses were flexor bilaterally. Gait and Station: Rising up from seated position without assistance, Leaning forward, mildly unsteady, wide based no assistive device  DIAGNOSTIC DATA (LABS, IMAGING, TESTING) -   ASSESSMENT AND PLAN  74 y.o. year old female  has a past medical history of essential tremor, no Parkinson's features on exam. The patient is currently on Klonopin 0.5 mg one to 2 daily, but she thinks it makes her tremor worse and wants to come off the medication. She has failed primidone Valium Ativan in the past  Decrease Klonipin to 1/2 tab for two weeks then discontinue. Call us back if  you want to resume  F/U in 6 months Danielle Bible, [facility], [facility], [facility] [address], [place], [place] [address] [phone]

## 2015-10-04 NOTE — Progress Notes (Signed)
I have reviewed and agreed above plan. 

## 2015-10-08 ENCOUNTER — Telehealth: Payer: Self-pay | Admitting: Neurology

## 2015-10-08 NOTE — Telephone Encounter (Signed)
Patient is calling and states she has cramps in her legs and needs medication sent to  [facility].  Please call.

## 2015-10-08 NOTE — Telephone Encounter (Signed)
Spoke to pt. Pt states that she told Hoyle Sauer, NP at her visit with her on 10/03/15 that she was experiencing leg cramps. Pt reports that Hoyle Sauer, NP told her to eat lots of bananas and drink lots of water. Pt reports that she has done this and it has not improved her leg cramps. Pt reports that she wakes up at 3 or 4 in the morning with severe leg cramps.  I asked pt if she has seen her PCP regarding this problem? She says that Dr. Dagmar Hait is aware of them and advised the pt to perform stretches to help her cramps. This has not helped either.  I advised pt that both Hoyle Sauer, NP and Dr. Krista Blue are out of the office this week. I offered to send this message to the [facility], but I advised the pt that they might recommended seeing her PCP regarding muscle cramps. They might not be willing to prescribe her a medication right away without seeing her in the office. Pt states " I have already spent too much money seeing doctors so I am not coming in to see another one. You are not helping me". I again offered to send it to a [facility] to see what they advise, but then pt states, " I will just call my PCP about this." I asked her to please call us back with further questions or concerns, or if her PCP defers to a neurologist. Pt verbalized understanding.

## 2015-10-09 ENCOUNTER — Ambulatory Visit: Payer: Medicare Other | Admitting: Nurse Practitioner

## 2015-10-19 ENCOUNTER — Encounter (HOSPITAL_COMMUNITY): Payer: Self-pay | Admitting: Emergency Medicine

## 2015-10-19 ENCOUNTER — Emergency Department (HOSPITAL_COMMUNITY)
Admission: EM | Admit: 2015-10-19 | Discharge: 2015-10-19 | Disposition: A | Discharge status: Home / Self Care | Payer: Medicare Other | Attending: Emergency Medicine | Admitting: Emergency Medicine

## 2015-10-19 DIAGNOSIS — Y939 Activity, unspecified: Secondary | ICD-10-CM | POA: Insufficient documentation

## 2015-10-19 DIAGNOSIS — I10 Essential (primary) hypertension: Secondary | ICD-10-CM | POA: Diagnosis not present

## 2015-10-19 DIAGNOSIS — Y998 Other external cause status: Secondary | ICD-10-CM | POA: Diagnosis not present

## 2015-10-19 DIAGNOSIS — Z7982 Long term (current) use of aspirin: Secondary | ICD-10-CM | POA: Diagnosis not present

## 2015-10-19 DIAGNOSIS — R251 Tremor, unspecified: Secondary | ICD-10-CM | POA: Diagnosis not present

## 2015-10-19 DIAGNOSIS — Y92410 Unspecified street and highway as the place of occurrence of the external cause: Secondary | ICD-10-CM | POA: Diagnosis not present

## 2015-10-19 DIAGNOSIS — K219 Gastro-esophageal reflux disease without esophagitis: Secondary | ICD-10-CM | POA: Insufficient documentation

## 2015-10-19 DIAGNOSIS — F419 Anxiety disorder, unspecified: Secondary | ICD-10-CM | POA: Diagnosis not present

## 2015-10-19 DIAGNOSIS — Z853 Personal history of malignant neoplasm of breast: Secondary | ICD-10-CM | POA: Diagnosis not present

## 2015-10-19 DIAGNOSIS — E785 Hyperlipidemia, unspecified: Secondary | ICD-10-CM | POA: Diagnosis not present

## 2015-10-19 DIAGNOSIS — Z862 Personal history of diseases of the blood and blood-forming organs and certain disorders involving the immune mechanism: Secondary | ICD-10-CM | POA: Insufficient documentation

## 2015-10-19 DIAGNOSIS — M546 Pain in thoracic spine: Secondary | ICD-10-CM

## 2015-10-19 DIAGNOSIS — S29002A Unspecified injury of muscle and tendon of back wall of thorax, initial encounter: Secondary | ICD-10-CM | POA: Diagnosis present

## 2015-10-19 MED ORDER — ACETAMINOPHEN 325 MG PO TABS
650.0000 mg | ORAL_TABLET | Freq: Once | ORAL | Status: AC
  Administered 2015-10-19: 650 mg via ORAL
  Filled 2015-10-19: qty 2

## 2015-10-19 MED ORDER — METHOCARBAMOL 500 MG PO TABS: 500.0000 mg | ORAL_TABLET | Freq: Two times a day (BID) | ORAL | Status: DC

## 2015-10-19 NOTE — ED Notes (Signed)
MD at bedside. steinl

## 2015-10-19 NOTE — Discharge Instructions (Signed)
Please follow up with your primary care provider within one week. You may take the Tramadol you have at home as needed for pain. I will give you a prescription for Robaxin, a muscle relaxant, to take as needed as well. Be careful because it can make you drowsy.
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
## 2015-10-19 NOTE — ED Notes (Signed)
PA at bedside.
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
## 2015-10-19 NOTE — ED Notes (Signed)
Pt placed in gown. Pt ambulated to restroom.

## 2015-11-15 ENCOUNTER — Telehealth: Payer: Self-pay | Admitting: Internal Medicine

## 2015-11-18 NOTE — Telephone Encounter (Signed)
(  message was not routed to Triage on Friday 11/15/15) Called and left message for patient to call back.

## 2015-11-19 NOTE — Telephone Encounter (Signed)
Spoke with patient; she will come in on Wednesday 11/20/15 at 9:15am to see CY. Nothing more needed at this time.

## 2015-11-19 NOTE — Telephone Encounter (Signed)
Spoke with pt, c/o chest soreness, tightness, shortness of breath, nonprod cough X1 week.   Denies fever, mucus production.  Pt has not taken anything to help with s/s.    Pt uses [facility] on [address] please advise on recs.  Thanks!   Allergies  Allergen Reactions  . Dextromethorphan Polistirex Er Other (See Comments)    SOB  . Primidone     Severe Headaches  . Albuterol     REACTION: tachycardia  . Asa [Aspirin]     Adult dose  . Loratadine   . Simvastatin     Myalgias    Current Outpatient Prescriptions on File Prior to Visit  Medication Sig Dispense Refill  . aspirin 81 MG tablet Take 81 mg by mouth every other day.     . benzonatate (TESSALON) 200 MG capsule Take 1 capsule (200 mg total) by mouth 3 (three) times daily as needed for cough. 30 capsule 6  . clonazePAM (KLONOPIN) 0.5 MG tablet Take 0.5-1 tablets (0.25-0.5 mg total) by mouth 2 (two) times daily as needed. 60 tablet 5  . methocarbamol (ROBAXIN) 500 MG tablet Take 1 tablet (500 mg total) by mouth 2 (two) times daily. 20 tablet 0  . pantoprazole (PROTONIX) 40 MG tablet Take 40 mg by mouth daily.    . promethazine-codeine (PHENERGAN WITH CODEINE) 6.25-10 MG/5ML syrup TAKE 1 TEASPOONFUL EVERY 6 HOURS AS NEEDED  0  . traMADol-acetaminophen (ULTRACET) 37.5-325 MG per tablet Take 1 tablet by mouth every 6 (six) hours as needed.    . valsartan-hydrochlorothiazide (DIOVAN-HCT) 80-12.5 MG per tablet Take 1 tablet by mouth daily.  4   No current facility-administered medications on file prior to visit.

## 2015-11-20 ENCOUNTER — Ambulatory Visit (INDEPENDENT_AMBULATORY_CARE_PROVIDER_SITE_OTHER): Payer: Medicare Other | Admitting: Internal Medicine

## 2015-11-20 ENCOUNTER — Ambulatory Visit (INDEPENDENT_AMBULATORY_CARE_PROVIDER_SITE_OTHER)
Admission: RE | Admit: 2015-11-20 | Discharge: 2015-11-20 | Disposition: A | Discharge status: Home / Self Care | Payer: Medicare Other | Source: Ambulatory Visit | Attending: Internal Medicine | Admitting: Internal Medicine

## 2015-11-20 ENCOUNTER — Encounter: Payer: Self-pay | Admitting: Internal Medicine

## 2015-11-20 VITALS — BP 102/58 | HR 101 | Ht 63.0 in | Wt 136.0 lb

## 2015-11-20 DIAGNOSIS — J441 Chronic obstructive pulmonary disease with (acute) exacerbation: Secondary | ICD-10-CM | POA: Diagnosis not present

## 2015-11-20 DIAGNOSIS — J449 Chronic obstructive pulmonary disease, unspecified: Secondary | ICD-10-CM | POA: Diagnosis not present

## 2015-11-20 DIAGNOSIS — D869 Sarcoidosis, unspecified: Secondary | ICD-10-CM | POA: Diagnosis not present

## 2015-11-20 DIAGNOSIS — R0789 Other chest pain: Secondary | ICD-10-CM | POA: Diagnosis not present

## 2015-11-20 MED ORDER — METHYLPREDNISOLONE ACETATE 80 MG/ML IJ SUSP
80.0000 mg | Freq: Once | INTRAMUSCULAR | Status: AC
  Administered 2015-11-20: 80 mg via INTRAMUSCULAR

## 2015-11-20 MED ORDER — LEVALBUTEROL HCL 0.63 MG/3ML IN NEBU
0.6300 mg | INHALATION_SOLUTION | Freq: Once | RESPIRATORY_TRACT | Status: AC
  Administered 2015-11-20: 0.63 mg via RESPIRATORY_TRACT

## 2015-11-20 NOTE — Patient Instructions (Addendum)
Ok to try Aleve or Tylenol for the muscle aches in your chest  Try using a heating pad  Neb xop 0.63   For dx chronic bronchitis  Depo 67  Order- CXR   Dx Sarcoid, Chronic bronchitis  We can keep appointment for May 2, unless needed sooner
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
## 2015-11-20 NOTE — Assessment & Plan Note (Signed)
Scarring from old sarcoid and fibrosis from old x-ray therapy without acute symptoms.
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
## 2015-12-24 ENCOUNTER — Other Ambulatory Visit: Payer: Self-pay | Admitting: Neurology

## 2015-12-24 DIAGNOSIS — M79673 Pain in unspecified foot: Secondary | ICD-10-CM

## 2015-12-25 ENCOUNTER — Ambulatory Visit (INDEPENDENT_AMBULATORY_CARE_PROVIDER_SITE_OTHER): Payer: Medicare Other | Admitting: Podiatry

## 2015-12-25 ENCOUNTER — Encounter: Payer: Self-pay | Admitting: Podiatry

## 2015-12-25 DIAGNOSIS — B351 Tinea unguium: Secondary | ICD-10-CM

## 2015-12-25 DIAGNOSIS — M79673 Pain in unspecified foot: Secondary | ICD-10-CM

## 2015-12-25 NOTE — Progress Notes (Signed)
Patient ID: Danielle Peters, female   DOB: 09-29-1941, 75 y.o.   MRN: [id number] Complaint:  Visit Type: [name] for continued preventative foot care services. Complaint: Patient states" my nails have grown long and thick and become painful to walk and wear shoes" . The patient presents for preventative foot care services. No changes to ROS  Podiatric Exam: Vascular: dorsalis pedis and posterior tibial pulses are palpable bilateral. Capillary return is immediate. Temperature gradient is WNL. Skin turgor WNL  Sensorium: Normal Semmes Weinstein monofilament test. Normal tactile sensation bilaterally. Nail Exam: Pt has thick disfigured discolored nails with subungual debris noted bilateral entire nail hallux through fifth toenails Ulcer Exam: There is no evidence of ulcer or pre-ulcerative changes or infection. Orthopedic Exam: Muscle tone and strength are WNL. No limitations in general ROM. No crepitus or effusions noted. Foot type and digits show no abnormalities. Bony prominences are unremarkable. Skin: No Porokeratosis. No infection or ulcers  Diagnosis:  Onychomycosis, , Pain in right toe, pain in left toes  Treatment & Plan Procedures and Treatment: Consent by patient was obtained for treatment procedures. The patient understood the discussion of treatment and procedures well. All questions were answered thoroughly reviewed. Debridement of mycotic and hypertrophic toenails, 1 through 5 bilateral and clearing of subungual debris. No ulceration, no infection noted. Asymptomatic callus hallux B/L. Return Visit-Office Procedure: Patient instructed to return to the office for a follow up visit 3 months for continued evaluation and treatment.  Gardiner Barefoot DPM

## 2015-12-31 ENCOUNTER — Other Ambulatory Visit: Payer: Self-pay | Admitting: *Deleted

## 2015-12-31 ENCOUNTER — Telehealth: Payer: Self-pay | Admitting: Neurology

## 2015-12-31 MED ORDER — CLONAZEPAM 0.5 MG PO TABS: 0.2500 mg | ORAL_TABLET | Freq: Two times a day (BID) | ORAL | Status: DC | PRN

## 2015-12-31 NOTE — Telephone Encounter (Signed)
Patient is calling and states that her pharmacy would not fill her Rx clonazePAM 0.5 mg tablet.  She wants to know if another medication will be prescribed.  Please call.

## 2015-12-31 NOTE — Telephone Encounter (Signed)
Faxed to [phone] confirmed.

## 2015-12-31 NOTE — Telephone Encounter (Signed)
Spoke to pt and let her know

## 2015-12-31 NOTE — Telephone Encounter (Signed)
She can restart at previous dose

## 2015-12-31 NOTE — Telephone Encounter (Signed)
I called pt and she states that she did stop the clonazepam but then restarted this taking 1/2 tablet po daily (but is had been written previously as bid).  She states that the tremors are worse from not being on this drug.  She noted that she has intermittent times when her tremor is worse. Would like to get prescription.

## 2015-12-31 NOTE — Telephone Encounter (Signed)
I called and spoke to pt.  She states she does not have allergy to klonopin/clonazepam).

## 2015-12-31 NOTE — Telephone Encounter (Signed)
Refill sent.

## 2016-01-01 ENCOUNTER — Ambulatory Visit: Payer: Medicare Other | Admitting: Podiatry

## 2016-01-02 ENCOUNTER — Encounter (HOSPITAL_COMMUNITY): Payer: Self-pay | Admitting: Emergency Medicine

## 2016-01-02 ENCOUNTER — Emergency Department (HOSPITAL_COMMUNITY): Payer: Medicare Other

## 2016-01-02 ENCOUNTER — Emergency Department (HOSPITAL_COMMUNITY)
Admission: EM | Admit: 2016-01-02 | Discharge: 2016-01-02 | Disposition: A | Discharge status: Home / Self Care | Payer: Medicare Other | Attending: Emergency Medicine | Admitting: Emergency Medicine

## 2016-01-02 DIAGNOSIS — K5900 Constipation, unspecified: Secondary | ICD-10-CM | POA: Diagnosis not present

## 2016-01-02 DIAGNOSIS — K30 Functional dyspepsia: Secondary | ICD-10-CM | POA: Diagnosis not present

## 2016-01-02 DIAGNOSIS — Z862 Personal history of diseases of the blood and blood-forming organs and certain disorders involving the immune mechanism: Secondary | ICD-10-CM | POA: Diagnosis not present

## 2016-01-02 DIAGNOSIS — Z79899 Other long term (current) drug therapy: Secondary | ICD-10-CM | POA: Insufficient documentation

## 2016-01-02 DIAGNOSIS — F419 Anxiety disorder, unspecified: Secondary | ICD-10-CM | POA: Diagnosis not present

## 2016-01-02 DIAGNOSIS — Z853 Personal history of malignant neoplasm of breast: Secondary | ICD-10-CM | POA: Insufficient documentation

## 2016-01-02 DIAGNOSIS — Z7982 Long term (current) use of aspirin: Secondary | ICD-10-CM | POA: Insufficient documentation

## 2016-01-02 DIAGNOSIS — R079 Chest pain, unspecified: Secondary | ICD-10-CM | POA: Insufficient documentation

## 2016-01-02 DIAGNOSIS — E785 Hyperlipidemia, unspecified: Secondary | ICD-10-CM | POA: Insufficient documentation

## 2016-01-02 LAB — CBC
HEMATOCRIT: 42.1 % (ref 36.0–46.0)
Hemoglobin: 13.6 g/dL (ref 12.0–15.0)
MCH: 27.8 pg (ref 26.0–34.0)
MCHC: 32.3 g/dL (ref 30.0–36.0)
MCV: 85.9 fL (ref 78.0–100.0)
Platelets: 162 10*3/uL (ref 150–400)
RBC: 4.9 MIL/uL (ref 3.87–5.11)
RDW: 13.6 % (ref 11.5–15.5)
WBC: 7.5 10*3/uL (ref 4.0–10.5)

## 2016-01-02 LAB — I-STAT TROPONIN, ED: Troponin i, poc: 0 ng/mL (ref 0.00–0.08)

## 2016-01-02 LAB — BASIC METABOLIC PANEL
Anion gap: 14 (ref 5–15)
BUN: 15 mg/dL (ref 6–20)
CHLORIDE: 98 mmol/L — AB (ref 101–111)
CO2: 27 mmol/L (ref 22–32)
Calcium: 10 mg/dL (ref 8.9–10.3)
Creatinine, Ser: 1.15 mg/dL — ABNORMAL HIGH (ref 0.44–1.00)
GFR calc non Af Amer: 45 mL/min — ABNORMAL LOW (ref >60)
GFR, EST AFRICAN AMERICAN: 52 mL/min — AB (ref >60)
Glucose, Bld: 104 mg/dL — ABNORMAL HIGH (ref 65–99)
POTASSIUM: 3.9 mmol/L (ref 3.5–5.1)
Sodium: 139 mmol/L (ref 135–145)

## 2016-01-02 MED ORDER — GLYCERIN (LAXATIVE) 2.1 G RE SUPP
1.0000 | Freq: Once | RECTAL | Status: AC
  Administered 2016-01-02: 1 via RECTAL
  Filled 2016-01-02: qty 1

## 2016-01-02 MED ORDER — ONDANSETRON 4 MG PO TBDP
4.0000 mg | ORAL_TABLET | Freq: Once | ORAL | Status: AC
  Administered 2016-01-02: 4 mg via ORAL
  Filled 2016-01-02: qty 1

## 2016-01-02 MED ORDER — FLEET ENEMA 7-19 GM/118ML RE ENEM
1.0000 | ENEMA | Freq: Once | RECTAL | Status: AC
  Administered 2016-01-02: 1 via RECTAL
  Filled 2016-01-02: qty 1

## 2016-01-02 MED ORDER — GI COCKTAIL ~~LOC~~
30.0000 mL | Freq: Once | ORAL | Status: AC
  Administered 2016-01-02: 30 mL via ORAL
  Filled 2016-01-02: qty 30

## 2016-01-02 NOTE — ED Notes (Signed)
Pt states for the last week she has been having a burning sensation in the center of her chest that goes into her stomach also. Pt denies any sob, diaphoresis or n/v. Pt also states she has been unable to have normal BM in the last week without taking laxatives.

## 2016-01-02 NOTE — Discharge Instructions (Signed)
Continue to take your omperazole and take miralax. Take 1 packet up to 3 times a day in 12 oz of water until you are making at least 1 large soft stool a day. If you develop diarrhea, decrease the amount you are taking. Follow up very closely with your primary care doctor. If you get constipated again try fleet's glycerine suppository or an enema.  Constipation, Adult Constipation is when a person has fewer than three bowel movements a week, has difficulty having a bowel movement, or has stools that are dry, hard, or larger than normal. As people grow older, constipation is more common. A low-fiber diet, not taking in enough fluids, and taking certain medicines may make constipation worse.  CAUSES   Certain medicines, such as antidepressants, pain medicine, iron supplements, antacids, and water pills.   Certain diseases, such as diabetes, irritable bowel syndrome (IBS), thyroid disease, or depression.   Not drinking enough water.   Not eating enough fiber-rich foods.   Stress or travel.   Lack of physical activity or exercise.   Ignoring the urge to have a bowel movement.   Using laxatives too much.  SIGNS AND SYMPTOMS   Having fewer than three bowel movements a week.   Straining to have a bowel movement.   Having stools that are hard, dry, or larger than normal.   Feeling full or bloated.   Pain in the lower abdomen.   Not feeling relief after having a bowel movement.  DIAGNOSIS  Your health care provider will take a medical history and perform a physical exam. Further testing may be done for severe constipation. Some tests may include:  A barium enema X-ray to examine your rectum, colon, and, sometimes, your small intestine.   A sigmoidoscopy to examine your lower colon.   A colonoscopy to examine your entire colon. TREATMENT  Treatment will depend on the severity of your constipation and what is causing it. Some dietary treatments include drinking more  fluids and eating more fiber-rich foods. Lifestyle treatments may include regular exercise. If these diet and lifestyle recommendations do not help, your health care provider may recommend taking over-the-counter laxative medicines to help you have bowel movements. Prescription medicines may be prescribed if over-the-counter medicines do not work.  HOME CARE INSTRUCTIONS   Eat foods that have a lot of fiber, such as fruits, vegetables, whole grains, and beans.  Limit foods high in fat and processed sugars, such as french fries, hamburgers, cookies, candies, and soda.   A fiber supplement may be added to your diet if you cannot get enough fiber from foods.   Drink enough fluids to keep your urine clear or pale yellow.   Exercise regularly or as directed by your health care provider.   Go to the restroom when you have the urge to go. Do not hold it.   Only take over-the-counter or prescription medicines as directed by your health care provider. Do not take other medicines for constipation without talking to your health care provider first.  [place] IF:   You have bright red blood in your stool.   Your constipation lasts for more than 4 days or gets worse.   You have abdominal or rectal pain.   You have thin, pencil-like stools.   You have unexplained weight loss. MAKE SURE YOU:   Understand these instructions.  Will watch your condition.  Will get help right away if you are not doing well or get worse.   This information  is not intended to replace advice given to you by your health care provider. Make sure you discuss any questions you have with your health care provider.   Document Released: 07/03/2004 Document Revised: 10/26/2014 Document Reviewed: 07/17/2013 Elsevier Interactive Patient Education 2016 Elsevier Inc.  High-Fiber Diet Fiber, also called dietary fiber, is a type of carbohydrate found in fruits, vegetables, whole grains, and beans.  A high-fiber diet can have many health benefits. Your health care provider may recommend a high-fiber diet to help:  Prevent constipation. Fiber can make your bowel movements more regular.  Lower your cholesterol.  Relieve hemorrhoids, uncomplicated diverticulosis, or irritable bowel syndrome.  Prevent overeating as part of a weight-loss plan.  Prevent heart disease, type 2 diabetes, and certain cancers. WHAT IS MY PLAN? The recommended daily intake of fiber includes:  38 grams for men under age 70.  29 grams for men over age 68.  70 grams for women under age 41.  64 grams for women over age 73. You can get the recommended daily intake of dietary fiber by eating a variety of fruits, vegetables, grains, and beans. Your health care provider may also recommend a fiber supplement if it is not possible to get enough fiber through your diet. WHAT DO I NEED TO KNOW ABOUT A HIGH-FIBER DIET?  Fiber supplements have not been widely studied for their effectiveness, so it is better to get fiber through food sources.  Always check the fiber content on thenutrition facts label of any prepackaged food. Look for foods that contain at least 5 grams of fiber per serving.  Ask your dietitian if you have questions about specific foods that are related to your condition, especially if those foods are not listed in the following section.  Increase your daily fiber consumption gradually. Increasing your intake of dietary fiber too quickly may cause bloating, cramping, or gas.  Drink plenty of water. Water helps you to digest fiber. WHAT FOODS CAN I EAT? Grains Whole-grain breads. Multigrain cereal. Oats and oatmeal. Brown rice. Barley. Bulgur wheat. [place]. Bran muffins. Popcorn. Rye wafer crackers. Vegetables Sweet potatoes. Spinach. Kale. Artichokes. Cabbage. Broccoli. Green peas. Carrots. Squash. Fruits Berries. Pears. Apples. Oranges. Avocados. Prunes and raisins. Dried figs. Meats and Other  Protein Sources [place], kidney, pinto, and soy beans. Split peas. Lentils. Nuts and seeds. Dairy Fiber-fortified yogurt. Beverages Fiber-fortified soy milk. Fiber-fortified orange juice. Other Fiber bars. The items listed above may not be a complete list of recommended foods or beverages. Contact your dietitian for more options. WHAT FOODS ARE NOT RECOMMENDED? Grains White bread. Pasta made with refined flour. White rice. Vegetables Fried potatoes. Canned vegetables. Well-cooked vegetables.  Fruits Fruit juice. Cooked, strained fruit. Meats and Other Protein Sources Fatty cuts of meat. Fried Sales executive or fried fish. Dairy Milk. Yogurt. Cream cheese. Sour cream. Beverages Soft drinks. Other Cakes and pastries. Butter and oils. The items listed above may not be a complete list of foods and beverages to avoid. Contact your dietitian for more information. WHAT ARE SOME TIPS FOR INCLUDING HIGH-FIBER FOODS IN MY DIET?  Eat a wide variety of high-fiber foods.  Make sure that half of all grains consumed each day are whole grains.  Replace breads and cereals made from refined flour or white flour with whole-grain breads and cereals.  Replace white rice with brown rice, bulgur wheat, or millet.  Start the day with a breakfast that is high in fiber, such as a cereal that contains at least 5 grams of fiber per  serving.  Use beans in place of meat in soups, salads, or pasta.  Eat high-fiber snacks, such as berries, raw vegetables, nuts, or popcorn.   This information is not intended to replace advice given to you by your health care provider. Make sure you discuss any questions you have with your health care provider.   Document Released: 10/05/2005 Document Revised: 10/26/2014 Document Reviewed: 03/20/2014 Elsevier Interactive Patient Education [employer].

## 2016-01-02 NOTE — ED Notes (Signed)
PA Abigail states to hold FLEET enema to see if suppository is successful.

## 2016-01-02 NOTE — ED Notes (Signed)
Pt has had mutiple BM's since FLEET's enema.

## 2016-01-02 NOTE — ED Notes (Signed)
Pt ambulated to restroom. 

## 2016-01-02 NOTE — ED Provider Notes (Signed)
CSN: [id number]     Arrival date & time 01/02/16  1033 History   First MD Initiated Contact with Patient 01/02/16 1109     Chief Complaint  Patient presents with  . Chest Pain  . Constipation     (Consider location/radiation/quality/duration/timing/severity/associated sxs/prior Treatment) HPI  Danielle Peters The patient is a 75 year old female who presents to the emergency department with chief complaint of constipation and burning epigastric pain. She has had the burning epigastric pain under her ribs into her chest for several months. She seen her primary care physician. 4. She takes omeprazole. She denies any new pain, chest pain, shortness of breath. Patient states that she's had about 1 week of constipation. She made one small bowel movement yesterday but endorses rectal urgency. She called her PCP who told her to take MiraLAX. The patient took 1 packet of MiraLAX. She came to the emergency department because it did not work. She has not tried anything else. She has mild abdominal cramping. She denies severe pain.  Past Medical History  Diagnosis Date  . Cancer ([place])     breast ca  . Hypertension   . Pulmonary sarcoidosis ([place])   . Anxiety   . Esophageal reflux   . Anemia   . 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 Dyslipidemia     myaligia with statins  . Tremor     upper extremities  . Chest pain     echo 01/08/10- nl lv; mild aortic sclerosis; myoview 01/08/10- no ischemia   Past Surgical History  Procedure Laterality Date  . Left breast lumpectomy,chemo  2004    xrt  . Skin biopsy    . Upper gi endoscopy  02/2015    difficulty swallowing, Abd pain  . Boil Right 03/2015    on buttock, excision   No family history on file. Social History  Substance Use Topics  . Smoking status: Never Smoker   . Smokeless tobacco: Never Used  . Alcohol Use: No   OB History    No data available      Review of Systems  Ten systems reviewed and are negative for acute change, except as noted in the HPI.    Allergies  Dextromethorphan polistirex er; Primidone; Albuterol; Asa; Loratadine; and Simvastatin  Home Medications   Prior to Admission medications   Medication Sig Start Date End Date Taking? Authorizing Provider  aspirin 81 MG tablet Take 81 mg by mouth every other day.     Historical Provider, MD  benzonatate (TESSALON) 200 MG capsule Take 1 capsule (200 mg total) by mouth 3 (three) times daily as needed for cough. 03/27/15   Deneise Lever, MD  clonazePAM (KLONOPIN) 0.5 MG tablet Take 0.5-1 tablets (0.25-0.5 mg total) by mouth 2 (two) times daily as needed. 12/31/15   Dennie Bible, NP  methocarbamol (ROBAXIN) 500 MG tablet Take 1 tablet (500 mg total) by mouth 2 (two) times daily. 10/19/15   Olivia Canter Sam, PA-C  omeprazole (PRILOSEC) 40 MG capsule Take 40 mg by mouth daily.    Historical Provider, MD  ondansetron (ZOFRAN) 4 MG tablet  12/24/15   Historical Provider, MD  promethazine-codeine (PHENERGAN WITH CODEINE) 6.25-10 MG/5ML syrup TAKE 1 TEASPOONFUL EVERY 6 HOURS AS NEEDED 09/18/15   Historical Provider, MD  traMADol (ULTRAM) 50 MG tablet Take 100 mg by mouth every 8 (eight) hours as needed. for pain 12/20/15  Historical Provider, MD  traMADol-acetaminophen (ULTRACET) 37.5-325 MG per tablet Take 1 tablet by mouth every 6 (six) hours as needed.    Historical Provider, MD  valsartan-hydrochlorothiazide (DIOVAN-HCT) 80-12.5 MG per tablet Take 1 tablet by mouth daily. 05/29/15   Historical Provider, MD   BP 124/63 mmHg  Pulse 103  Temp(Src) 97.6 F (36.4 C) (Axillary)  Resp 16  Ht 5\' 3"  (1.6 m)  Wt 62.143 kg  BMI 24.27 kg/m2  SpO2 100% Physical Exam Physical Exam  Nursing note and vitals reviewed. Constitutional: She is oriented to person, place, and time. She appears well-developed and well-nourished. No distress.  HENT:  Head: Normocephalic and atraumatic.   Eyes: Conjunctivae normal and EOM are normal. Pupils are equal, round, and reactive to light. No scleral icterus.  Neck: Normal range of motion.  Cardiovascular: Normal rate, regular rhythm and normal heart sounds.  Exam reveals no gallop and no friction rub.   No murmur heard. Pulmonary/Chest: Effort normal and breath sounds normal. No respiratory distress.  Abdominal: Soft. Bowel sounds are normal. She exhibits no distension and no mass. There is no tenderness. There is no guarding.  Neurological: She is alert and oriented to person, place, and time.  Skin: Skin is warm and dry. She is not diaphoretic.  Rectal: Rectal exam reveals anal sphincter with good tone, no hemorrhoids or fissures. She has soft, small amount of stool in the rectum. Stool color is brown.  ED Course  Procedures (including critical care time) Labs Review Labs Reviewed  BASIC METABOLIC PANEL - Abnormal; Notable for the following:    Chloride 98 (*)    Glucose, Bld 104 (*)    Creatinine, Ser 1.15 (*)    GFR calc non Af Amer 45 (*)    GFR calc Af Amer 52 (*)    All other components within normal limits  CBC  I-STAT TROPOININ, ED    Imaging Review No results found. I have personally reviewed and evaluated these images and lab results as part of my medical decision-making.   EKG Interpretation   Date/Time:  Thursday January 02 2016 10:45:30 EDT Ventricular Rate:  102 PR Interval:  130 QRS Duration: 87 QT Interval:  334 QTC Calculation: 435 R Axis:   87 Text Interpretation:  Sinus tachycardia Borderline right axis deviation  Minimal ST depression, inferior leads Baseline wander in lead(s) III aVF  AGREE. NO CHANGE FROM OLD Confirmed by Johnney Killian, MD, Jeannie Done [phone]) on  01/02/2016 12:59:01 PM      MDM   Final diagnoses:  Upset stomach  Constipation, unspecified constipation type    12:02 PM BP 124/63 mmHg  Pulse 103  Temp(Src) 97.6 F (36.4 C) (Axillary)  Resp 16  Ht 5\' 3"  (1.6 m)  Wt 62.143 kg   BMI 24.27 kg/m2  SpO2 100% Patient with constipation. We will use a glycerin suppository if that does not work well. We will proceed to fleets enema.   Patient was able to have a large bowel movement. At the emergency department after use of one fleets suppository. She has chronic epigastric discomfort. I doubt any emergent cause such as acute coronary syndrome. Her EKG, troponin and chest x-ray are also not suggestive of this diagnosis. Patient has some hypotension after her bowel movement but she is ambulatory without dizziness. She appears safe for discharge at this time and should follow closely with her pcp.   Margarita Mail, PA-C 01/03/16 1016  Charlesetta Shanks, MD 01/03/16 1145

## 2016-02-18 ENCOUNTER — Ambulatory Visit (INDEPENDENT_AMBULATORY_CARE_PROVIDER_SITE_OTHER): Payer: Medicare Other | Admitting: Internal Medicine

## 2016-02-18 ENCOUNTER — Encounter: Payer: Self-pay | Admitting: Internal Medicine

## 2016-02-18 VITALS — BP 114/66 | HR 87 | Ht 63.0 in | Wt 136.8 lb

## 2016-02-18 DIAGNOSIS — D869 Sarcoidosis, unspecified: Secondary | ICD-10-CM | POA: Diagnosis not present

## 2016-02-18 DIAGNOSIS — C50411 Malignant neoplasm of upper-outer quadrant of right female breast: Secondary | ICD-10-CM | POA: Diagnosis not present

## 2016-02-18 DIAGNOSIS — J449 Chronic obstructive pulmonary disease, unspecified: Secondary | ICD-10-CM

## 2016-02-18 MED ORDER — PSEUDOEPHEDRINE-CODEINE-GG 30-10-100 MG/5ML PO SOLN: 10.0000 mL | Freq: Four times a day (QID) | ORAL | Status: DC | PRN

## 2016-02-18 NOTE — Assessment & Plan Note (Signed)
Clinical long-term remission with residual scarring

## 2016-02-18 NOTE — Assessment & Plan Note (Signed)
Now almost 12 years out by her report with no evidence of recurrence identified. She will be followed by oncology.

## 2016-02-18 NOTE — Patient Instructions (Signed)
Script for chertussin cough syrup to use if needed
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
## 2016-02-18 NOTE — Assessment & Plan Note (Signed)
At baseline now. Cough reflects chronic bronchitis, old sarcoid and postradiation effects. Is stable and baseline for her. Plan-try Cheratussin cough syrup for occasional use if OTC cough syrup and throat lozenges insufficient.

## 2016-02-20 ENCOUNTER — Telehealth: Payer: Self-pay | Admitting: Internal Medicine

## 2016-02-20 NOTE — Telephone Encounter (Signed)
Pt was seen by CY on 02/18/16 and was given [place] (pseudoephedrine-codeine-guaifenesin).  This is no longer available.  Cheratussin AC (guaifenesin-codeine) is available.  Dr. Annamaria Boots, please advise if ok to change cheratussin DAC to cheratussin [facility].  Thank you.

## 2016-02-20 NOTE — Telephone Encounter (Signed)
Yes  Cheratussin AC

## 2016-02-20 NOTE — Telephone Encounter (Signed)
Spoke with pharmacist, rx changed verbally to Cheratussin AC.  Nothing further needed.

## 2016-03-19 DIAGNOSIS — B029 Zoster without complications: Secondary | ICD-10-CM

## 2016-03-19 HISTORY — DX: Zoster without complications: B02.9

## 2016-03-24 ENCOUNTER — Encounter: Payer: Self-pay | Admitting: Nurse Practitioner

## 2016-03-24 ENCOUNTER — Ambulatory Visit (INDEPENDENT_AMBULATORY_CARE_PROVIDER_SITE_OTHER): Payer: Medicare Other | Admitting: Nurse Practitioner

## 2016-03-24 VITALS — BP 136/71 | HR 76 | Resp 16 | Ht 63.0 in | Wt 137.0 lb

## 2016-03-24 DIAGNOSIS — G250 Essential tremor: Secondary | ICD-10-CM

## 2016-03-24 MED ORDER — CLONAZEPAM 0.5 MG PO TABS: 0.5000 mg | ORAL_TABLET | Freq: Two times a day (BID) | ORAL | Status: DC

## 2016-03-24 NOTE — Progress Notes (Signed)
[facility]  PATIENT: Danielle Peters DOB: 1941-09-02   REASON FOR VISIT: Follow-up for essential tremor, anxiety HISTORY FROM: Patient    HISTORY OF PRESENT ILLNESS: HISTORY: YYMrs. 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 She presenting with few years history of bilateral hands tremor, getting worse when she stretched out her hands, or using utensils, she has describe difficulty feeding herself, cause a lot of social embarrasement, she denies significant gait difficulty, she also has head titubation She denied bilateral lower exremity paresthesia weakness, but does complain fingertips numbness tingling. She denied loss of smell, REM sleep disorder, has anxiety symptoms, denied family history of tremor Over the past few years, we have tried primadone, while taking 50mg  1/2 once a day , there was no significant improvement at that time, she also complains of headaches, but also tried diazepam, Ativan, without significant improvement UPDATE Oct 08 2014:YY She is no longer taking primidone, she is taking clonazepam 0.5 mg half to one tablets every morning, which has helped her tremor, she complains of anxiety, but does not want to take more medications for it  UPDATE 10/03/15 CMMs. Danielle Peters, 75 year old female returns for follow-up. She has a history of essential tremor and anxiety disorder. She was last seen in this office 04/09/15. She has tried primadone, while taking 50mg  1/2 once a day , there was no significant improvement at that time, she also complains of headaches, but also tried diazepam, Ativan, without significant improvement. She  has not tried Inderal 40 mg twice a day as she worries about side effects.Most recently she is on Klonopin .5mg  1-2 times daily. She thinks it is causing her to itch or that she might have an allergy to this medication. She has stopped going to her exercise class. She continues to drive without difficulty. She continues to be independent in activities of daily living and her tremor does not interfere. She denies loss of sense of smell, REM sleep disorder, there is no family history of tremor. She denies any head titubation. She denies any falls. She returns for reevaluation UPDATE 6/6/17CM Danielle Peters, 75 year old female returns for follow-up. She has a history of essential tremor and is currently not taking clonazepam as prescribed. She has failed Ativan diazepam and primidone in the past due to side effects or the medication or was ineffective. Patient reports pain and swelling in her R hand but on exam I do not see any swelling. She is taking 1/4 tab of Clonazepam daily and reports that it is not helping her tremors. She claims she has a lot of anxiety as 2 of her brothers died within 6 months of each other and she thinks  about that a lot. Lab work from primary care with CMP and CBC within normal limits however cholesterol 257 and LDL 176. She has been told watch her diet and exercise. She continues to be independent in her activities of daily living living, she denies loss of sense of smell any sleep disorder. She has had no falls. She returns for reevaluation   REVIEW OF SYSTEMS: Full 14 system review of systems performed and notable only for those listed, all others are neg:  Constitutional: neg  Cardiovascular: neg Ear/Nose/Throat: neg  Skin: neg Eyes: neg Respiratory: neg  Gastroitestinal: neg  Hematology/Lymphatic: neg  Endocrine: neg Musculoskeletal:neg Allergy/Immunology: neg Neurological: neg Psychiatric: Anxiety Sleep : neg   ALLERGIES: Allergies  Allergen Reactions  .  Dextromethorphan Polistirex Er Other (See Comments)    SOB  . Primidone     Severe Headaches  . Albuterol     REACTION: tachycardia  . Asa [Aspirin]     Adult dose  . Loratadine   . Simvastatin     Myalgias     HOME MEDICATIONS: Outpatient Prescriptions Prior to Visit  Medication Sig Dispense Refill  . acetaminophen (TYLENOL) 325 MG tablet Take 650 mg by mouth every 6 (six) hours as needed for mild pain.    [name] aspirin 81 MG tablet Take 81 mg by mouth every other day.     . clonazePAM (KLONOPIN) 0.5 MG tablet Take 0.5-1 tablets (0.25-0.5 mg total) by mouth 2 (two) times daily as needed. (Patient taking differently: Take 0.25-0.5 mg by mouth 2 (two) times daily as needed for anxiety. ) 60 tablet 5  . methocarbamol (ROBAXIN) 500 MG tablet Take 1 tablet (500 mg total) by mouth 2 (two) times daily. 20 tablet 0  . mometasone (ELOCON) 0.1 % cream Apply 1 application topically 2 (two) times daily.  2  . ondansetron (ZOFRAN) 4 MG tablet Take 4 mg by mouth every 8 (eight) hours as needed for nausea.     . polyethylene glycol powder (GLYCOLAX/MIRALAX) powder Threes time a week    . pseudoephedrine-codeine-guaifenesin (CHERATUSSIN DAC) 30-10-100 MG/5ML solution Take 10 mLs by mouth 4 (four) times daily as needed for cough. 120 mL 0  . traMADol (ULTRAM) 50 MG tablet Take 100 mg by mouth every 8 (eight) hours as needed. for pain  4  . valsartan-hydrochlorothiazide (DIOVAN-HCT) 80-12.5 MG per tablet Take 1 tablet by mouth daily.  4  . benzonatate (TESSALON) 200 MG capsule Take 1 capsule (200 mg total) by mouth 3 (three) times daily as needed for cough. 30 capsule 6  . naproxen sodium (ANAPROX) 220 MG tablet Take 220 mg by mouth 2 (two) times daily as needed (pain).    [name] omeprazole (PRILOSEC) 40 MG capsule Take 40 mg by mouth daily.     No facility-administered medications prior to visit.    PAST MEDICAL HISTORY: Past Medical History  Diagnosis Date  . Cancer ([place])     breast ca  . Hypertension    . Pulmonary sarcoidosis ([place])   . Anxiety   . Esophageal reflux   . Anemia   . 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 Dyslipidemia     myaligia with statins  . Tremor     upper extremities  . Chest pain     echo 01/08/10- nl lv; mild aortic sclerosis; myoview 01/08/10- no ischemia    PAST SURGICAL HISTORY: Past Surgical History  Procedure Laterality Date  . Left breast lumpectomy,chemo  2004    xrt  . Skin biopsy    . Upper gi endoscopy  02/2015    difficulty swallowing, Abd pain  . Boil Right 03/2015    on buttock, excision    FAMILY HISTORY: No family history on file.  SOCIAL HISTORY: Social History   Social History  . Marital Status: Divorced    Spouse Name: N/A  . Number of Children: 2  . 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 Smoking status: Never Smoker   . Smokeless tobacco: Never Used  . Alcohol Use: No  . Drug Use: No  . Sexual Activity: Not on file   Other Topics Concern  . Not on file   Social History Narrative   Patient lives at home alone. Patient is retired.Pateint has high school education. And went to [employer].   Right handed.   Caffeine- one cup daily.     PHYSICAL EXAM  Filed Vitals:   03/24/16 0910  Height: 5\' 3"  (1.6 m)  Weight: 137 lb (62.143 kg)   Body mass index is 24.27 kg/(m^2). Generalized: Well developed, in no acute distress  Head: normocephalic and atraumatic,. Oropharynx benign  Neck: Supple, no carotid bruits  Cardiac: Regular rate rhythm, no murmur  Musculoskeletal: No deformity   Neurological examination   Mentation: Alert oriented to time, place, history taking. Follows all commands speech and language fluent  Cranial nerve II-XII: Pupils were equal round reactive to light extraocular movements were full, visual field were full on confrontational test. Facial  sensation and strength were normal. hearing loss left ear. Uvula tongue midline. head turning and shoulder shrug and were normal and symmetric.Tongue protrusion into cheek strength was normal. She has occasional head No-No tremor. Motor: normal bulk and tone, full strength in bilateral upper and lower extremity muscles, she has bilateral upper extremity postural tremor, right more than left, There was no significant rigidity, No bradykinesia Sensory: normal and symmetric to light touch, pinprick, and vibration  Coordination: finger-nose-finger, heel-to-shin bilaterally, no dysmetria Reflexes: Brachioradialis 2/2, biceps 2/2, triceps 2/2, patellar 2/2, Achilles 2/2, plantar responses were flexor bilaterally. Gait and Station: Rising up from seated position without assistance, Leaning forward, mildly unsteady, wide based no assistive device   DIAGNOSTIC DATA (LABS, IMAGING, TESTING) - I reviewed patient records, labs, notes, testing and imaging myself where available.  Lab Results  Component Value Date   WBC 7.5 01/02/2016   HGB 13.6 01/02/2016   HCT 42.1 01/02/2016   MCV 85.9 01/02/2016   PLT 162 01/02/2016      Component Value Date/Time   NA 139 01/02/2016 1058   K 3.9 01/02/2016 1058   CL 98* 01/02/2016 1058   CO2 27 01/02/2016 1058   GLUCOSE 104* 01/02/2016 1058   BUN 15 01/02/2016 1058   CREATININE 1.15* 01/02/2016 1058   CALCIUM 10.0 01/02/2016 1058   PROT 7.6 05/05/2010 1438   ALBUMIN 3.9 05/05/2010 1438   AST 29 05/05/2010 1438   ALT 19 05/05/2010 1438   ALKPHOS 105 05/05/2010 1438   BILITOT 1.0 05/05/2010 1438   GFRNONAA 45* 01/02/2016 1058   GFRAA 52* 01/02/2016 1058       ASSESSMENT AND PLAN 75 y.o. year old female has a past medical history of essential tremor, no Parkinson's features on exam. The patient is currently on Klonopin 0.5 mg 1/4 tab 1 to 2 times daily. She has failed primidone Valium Ativan in the past.  PLAN: Increase Klonopin to 0.5 twice  daily 12 hours apart Made her aware that her tremor may get worse over time, it may come and go and will be more noticeable on one side of your body and he can get worse due to stress and fatigue, caffeine and extremes of  heat or cold Healthy diet low-fat fresh fruits and vegetables Regular exercise by walking 30 minutes daily Follow-up in 6-8 months next visit with Dr. Doloris Hall Danielle Peters, [facility], [facility], [facility] [phone]  [address], [place], [place] [address] [phone]

## 2016-03-24 NOTE — Progress Notes (Signed)
I have reviewed and agreed above plan. 

## 2016-03-24 NOTE — Patient Instructions (Signed)
Increase Klonopin to 0.5 twice daily 12 hours apart Healthy diet low-fat fresh fruits and vegetables Regular exercise by walking 30 minutes daily Follow-up in 6-8 months next visit with Dr. Krista Blue

## 2016-03-26 ENCOUNTER — Ambulatory Visit: Payer: Medicare Other | Admitting: Podiatry

## 2016-04-02 ENCOUNTER — Ambulatory Visit: Payer: Medicare Other | Admitting: Nurse Practitioner

## 2016-05-27 ENCOUNTER — Other Ambulatory Visit: Payer: Self-pay | Admitting: Internal Medicine

## 2016-05-27 DIAGNOSIS — Z1231 Encounter for screening mammogram for malignant neoplasm of breast: Secondary | ICD-10-CM

## 2016-06-03 ENCOUNTER — Ambulatory Visit
Admission: RE | Admit: 2016-06-03 | Discharge: 2016-06-03 | Disposition: A | Discharge status: Home / Self Care | Payer: Medicare Other | Source: Ambulatory Visit | Attending: Internal Medicine | Admitting: Internal Medicine

## 2016-06-03 DIAGNOSIS — Z1231 Encounter for screening mammogram for malignant neoplasm of breast: Secondary | ICD-10-CM

## 2016-06-05 ENCOUNTER — Telehealth: Payer: Self-pay | Admitting: Nurse Practitioner

## 2016-06-05 NOTE — Telephone Encounter (Signed)
Called and spoke to pharmacy, they have refill available and will get ready.   I spoke to pt and let her know. She verbalized understanding.

## 2016-06-05 NOTE — Telephone Encounter (Signed)
Patient requesting refill of clonazePAM (KLONOPIN) 0.5 MG tablet Pharmacy:  [id number] - [place], [place]. AT [address]

## 2016-07-10 DIAGNOSIS — L309 Dermatitis, unspecified: Secondary | ICD-10-CM | POA: Insufficient documentation

## 2016-08-05 ENCOUNTER — Encounter: Payer: Self-pay | Admitting: Podiatry

## 2016-08-05 ENCOUNTER — Ambulatory Visit (INDEPENDENT_AMBULATORY_CARE_PROVIDER_SITE_OTHER): Payer: Medicare Other | Admitting: Podiatry

## 2016-08-05 VITALS — BP 141/85 | HR 103 | Resp 14

## 2016-08-05 DIAGNOSIS — B351 Tinea unguium: Secondary | ICD-10-CM | POA: Diagnosis not present

## 2016-08-05 DIAGNOSIS — L84 Corns and callosities: Secondary | ICD-10-CM | POA: Diagnosis not present

## 2016-08-05 DIAGNOSIS — M79676 Pain in unspecified toe(s): Secondary | ICD-10-CM | POA: Diagnosis not present

## 2016-08-05 NOTE — Progress Notes (Signed)
Patient ID: Danielle Peters, female   DOB: September 01, 1941, 75 y.o.   MRN: [id number] Complaint:  Visit Type: [name] for continued preventative foot care services. Complaint: Patient states" my nails have grown long and thick and become painful to walk and wear shoes" . The patient presents for preventative foot care services. No changes to ROS  Podiatric Exam: Vascular: dorsalis pedis and posterior tibial pulses are palpable bilateral. Capillary return is immediate. Temperature gradient is WNL. Skin turgor WNL  Sensorium: Normal Semmes Weinstein monofilament test. Normal tactile sensation bilaterally. Nail Exam: Pt has thick disfigured discolored nails with subungual debris noted bilateral entire nail hallux through fifth toenails Ulcer Exam: There is no evidence of ulcer or pre-ulcerative changes or infection. Orthopedic Exam: Muscle tone and strength are WNL. No limitations in general ROM. No crepitus or effusions noted. Foot type and digits show no abnormalities. Bony prominences are unremarkable. Skin: No Porokeratosis. No infection or ulcers.  Callus distal aspect hallux  B/L.  Diagnosis:  Onychomycosis, , Pain in right toe, pain in left toes,  Callus hallux  B/L  Treatment & Plan Procedures and Treatment: Consent by patient was obtained for treatment procedures. The patient understood the discussion of treatment and procedures well. All questions were answered thoroughly reviewed. Debridement of mycotic and hypertrophic toenails, 1 through 5 bilateral and clearing of subungual debris. No ulceration, no infection noted. Debride hallux callus  B/L. Return Visit-Office Procedure: Patient instructed to return to the office for a follow up visit 3 months for continued evaluation and treatment.  Gardiner Barefoot DPM

## 2016-08-20 ENCOUNTER — Encounter: Payer: Self-pay | Admitting: Internal Medicine

## 2016-08-20 ENCOUNTER — Ambulatory Visit (INDEPENDENT_AMBULATORY_CARE_PROVIDER_SITE_OTHER): Payer: Medicare Other | Admitting: Internal Medicine

## 2016-08-20 DIAGNOSIS — R0789 Other chest pain: Secondary | ICD-10-CM | POA: Diagnosis not present

## 2016-08-20 DIAGNOSIS — J449 Chronic obstructive pulmonary disease, unspecified: Secondary | ICD-10-CM | POA: Diagnosis not present

## 2016-08-20 DIAGNOSIS — K219 Gastro-esophageal reflux disease without esophagitis: Secondary | ICD-10-CM

## 2016-08-20 MED ORDER — HYDROCODONE-HOMATROPINE 5-1.5 MG/5ML PO SYRP: 5.0000 mL | ORAL_SOLUTION | Freq: Four times a day (QID) | ORAL | 0 refills | Status: DC | PRN

## 2016-08-20 NOTE — Assessment & Plan Note (Signed)
Reassured about bilateral mid axillary rib pains associated with aerobic exercise class

## 2016-08-20 NOTE — Assessment & Plan Note (Signed)
She always has a few rhonchi and some cough residual from sarcoid-induced scarring with chronic bronchitis. We discussed options. Try Hycodan, Depo-Medrol

## 2016-08-20 NOTE — Patient Instructions (Signed)
Script printed to try hydromet for cough if needed  Depo 80     Dx chronic bronchitis  Please call as needed

## 2016-08-20 NOTE — Assessment & Plan Note (Signed)
Reinforced importance of maintaining reflux precautions
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
## 2016-08-26 ENCOUNTER — Encounter: Payer: Self-pay | Admitting: Cardiology

## 2016-10-07 ENCOUNTER — Ambulatory Visit (INDEPENDENT_AMBULATORY_CARE_PROVIDER_SITE_OTHER): Payer: Medicare Other | Admitting: Neurology

## 2016-10-07 ENCOUNTER — Encounter: Payer: Self-pay | Admitting: Neurology

## 2016-10-07 VITALS — BP 147/70 | HR 104 | Ht 64.0 in | Wt 133.0 lb

## 2016-10-07 DIAGNOSIS — M5416 Radiculopathy, lumbar region: Secondary | ICD-10-CM | POA: Diagnosis not present

## 2016-10-07 DIAGNOSIS — R251 Tremor, unspecified: Secondary | ICD-10-CM

## 2016-10-07 NOTE — Progress Notes (Signed)
[facility]  PATIENT: Danielle Peters DOB: June 01, 1941   REASON FOR VISIT: Follow-up for essential tremor, anxiety HISTORY FROM: Patient    HISTORY OF PRESENT ILLNESS: HISTORY: YYMrs. 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 She presenting with few years history of bilateral hands tremor, getting worse when she stretched out her hands, or using utensils, she has describe difficulty feeding herself, cause a lot of social embarrasement, she denies significant gait difficulty, she also has head titubation She denied bilateral lower exremity paresthesia weakness, but does complain fingertips numbness tingling. She denied loss of smell, REM sleep disorder, has anxiety symptoms, denied family history of tremor Over the past few years, we have tried primadone, while taking 50mg  1/2 once a day , there was no significant improvement at that time, she also complains of headaches, but also tried diazepam, Ativan, without significant improvement UPDATE Oct 08 2014:  She is no longer taking primidone, she is taking clonazepam 0.5 mg half to one tablets every morning, which has helped her tremor, she complains of anxiety, but does not want to take more medications for it  UPDATE Oct 07 2016:  She took clonazepam 0.5 milligrams half to 1 tablet as needed for tremor for a while without significant improvement, she continue have worsening bilateral hands resting and posturing tremor, occasionally involving bilateral lower extremity, she denies significant gait abnormality, she exercise regularly, she complains of recent onset of left-sided low back pain,  radiating pain to left lower extremity, left calf muscle. He is taking Lyrica 75 mg a day  She just healed from right arm shingles in June 2017, still has intermittent itching in her right arm and hand,  REVIEW OF SYSTEMS: Full 14 system review of systems performed and notable only for those listed, all others are neg:     ALLERGIES: Allergies  Allergen Reactions  . Dextromethorphan Polistirex Er Other (See Comments)    SOB  . Primidone     Severe Headaches  . Albuterol     REACTION: tachycardia  . Asa [Aspirin]     Adult dose  . Loratadine   . Simvastatin     Myalgias     HOME MEDICATIONS: Outpatient Medications Prior to Visit  Medication Sig Dispense Refill  . acetaminophen (TYLENOL) 325 MG tablet Take 650 mg by mouth every 6 (six) hours as needed for mild pain.    [name] aspirin 81 MG tablet Take 81 mg by mouth every other day.     . clobetasol cream (TEMOVATE) [id number] % Apply 1 application topically 2 (two) times daily.    . clonazePAM (KLONOPIN) 0.5 MG tablet Take 1 tablet (0.5 mg total) by mouth 2 (two) times daily. 60 tablet 5  . esomeprazole (NEXIUM) 40 MG capsule Take by mouth 2 (two) times daily.  4  . HYDROcodone-homatropine (HYCODAN) 5-1.5 MG/5ML syrup Take 5 mLs by mouth every 6 (six) hours as needed for cough. 120 mL 0  . methocarbamol (ROBAXIN) 500 MG tablet Take 1 tablet (500 mg total) by mouth 2 (two) times daily. 20 tablet 0  . mometasone (ELOCON) 0.1 % cream Apply 1 application topically 2 (two) times daily.  2  . ondansetron (ZOFRAN) 4 MG tablet Take 4 mg by mouth every 8 (  eight) hours as needed for nausea.     . polyethylene glycol powder (GLYCOLAX/MIRALAX) powder Threes time a week    . pregabalin (LYRICA) 75 MG capsule Take 75 mg by mouth daily.    . pseudoephedrine-codeine-guaifenesin (CHERATUSSIN DAC) 30-10-100 MG/5ML solution Take 10 mLs by mouth 4 (four) times daily as needed for cough. 120 mL 0  . traMADol (ULTRAM) 50 MG tablet Take 100 mg by mouth every 8  (eight) hours as needed. for pain  4  . valsartan-hydrochlorothiazide (DIOVAN-HCT) 80-12.5 MG per tablet Take 1 tablet by mouth daily.  4   No facility-administered medications prior to visit.     PAST MEDICAL HISTORY: Past Medical History:  Diagnosis Date  . Anemia   . Anxiety   . Cancer ([place])    breast ca  . Chest pain    echo 01/08/10- nl lv; mild aortic sclerosis; myoview 01/08/10- no ischemia  . Dyslipidemia    myaligia with statins  . Esophageal reflux   . Hypertension   . Pulmonary sarcoidosis ([place])   . Shingles 03/2016  . Tremor    upper extremities  . Ventricular tachycardia (paroxysmal) (HCC)    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; AV node reentry tachycardia ablation     PAST SURGICAL HISTORY: Past Surgical History:  Procedure Laterality Date  . Boil Right 03/2015   on buttock, excision  . left breast lumpectomy,chemo  2004   xrt  . SKIN BIOPSY    . UPPER GI ENDOSCOPY  02/2015   difficulty swallowing, Abd pain    FAMILY HISTORY: No family history on file.  SOCIAL HISTORY: Social History   Social History  . Marital status: Divorced    Spouse name: N/A  . Number of children: 2  . 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 Smoking status: Never Smoker  . Smokeless tobacco: Never Used  . Alcohol use No  . Drug use: No  . Sexual activity: Not on file   Other Topics Concern  . Not on file   Social History Narrative   Patient lives at home alone. Patient is retired.Pateint has high school education. And went to [employer].   Right handed.   Caffeine- one cup daily.     PHYSICAL EXAM  Vitals:   10/07/16 0904  BP: (!) 147/70  Pulse: (!) 104  Weight: 133 lb (60.3 kg)  Height: 5\' 4"  (1.626 m)   Body mass index is 22.83 kg/m. Generalized: Well developed, in no acute distress  Head: normocephalic and atraumatic,. Oropharynx benign  Neck: Supple, no carotid bruits  Cardiac:  Regular rate rhythm, no murmur  Musculoskeletal: No deformity   Neurological examination She has mild anterocollis  Mentation: Alert oriented to time, place, history taking. Follows all commands speech and language fluent  Cranial nerve II-XII: Pupils were equal round reactive to light extraocular movements were full, visual field were full on confrontational test. Facial sensation and strength were normal. hearing loss left ear. Uvula tongue midline. head turning and shoulder shrug and were normal and symmetric.Tongue protrusion into cheek strength was normal. She has occasional head No-No tremor. Motor: normal bulk and tone, full strength in bilateral upper and lower extremity muscles, she has bilateral upper extremity resting and postural tremor, right more than left, There was no significant rigidity, No bradykinesia Sensory: normal and symmetric to light touch, pinprick, and vibration  Coordination: finger-nose-finger, heel-to-shin bilaterally, no dysmetria Reflexes:  Brachioradialis 2/2, biceps 2/2, triceps 2/2, patellar 2/2, Achilles 2/2, plantar responses were flexor bilaterally. Gait and Station: Rising up from seated position without assistance, Leaning forward, mildly unsteady, moderate stride, smooth turning  DIAGNOSTIC DATA (LABS, IMAGING, TESTING) - I reviewed patient records, labs, notes, testing and imaging myself where available.  Lab Results  Component Value Date   WBC 7.5 01/02/2016   HGB 13.6 01/02/2016   HCT 42.1 01/02/2016   MCV 85.9 01/02/2016   PLT 162 01/02/2016      Component Value Date/Time   NA 139 01/02/2016 1058   K 3.9 01/02/2016 1058   CL 98 (L) 01/02/2016 1058   CO2 27 01/02/2016 1058   GLUCOSE 104 (H) 01/02/2016 1058   BUN 15 01/02/2016 1058   CREATININE 1.15 (H) 01/02/2016 1058   CALCIUM 10.0 01/02/2016 1058   PROT 7.6 05/05/2010 1438   ALBUMIN 3.9 05/05/2010 1438   AST 29 05/05/2010 1438   ALT 19 05/05/2010 1438   ALKPHOS 105 05/05/2010  1438   BILITOT 1.0 05/05/2010 1438   GFRNONAA 45 (L) 01/02/2016 1058   GFRAA 52 (L) 01/02/2016 1058       ASSESSMENT AND PLAN 75 y.o. year old female  Essential tremor:  Resting and posturing component  Stop clonazepam Left low back pain radiating pain to left lower extremity  Consistent with left lumbar radiculopathy  Proceed with MRI of lumbar  Will try nortriptyline 25 mg every night  Return to clinic in 3 months   Marcial Pacas, M.D. Ph.D.  [facility] [place], [place] [address] Phone: [phone] Fax:      [phone]

## 2016-10-09 ENCOUNTER — Telehealth: Payer: Self-pay | Admitting: Neurology

## 2016-10-09 MED ORDER — NORTRIPTYLINE HCL 25 MG PO CAPS: 25.0000 mg | ORAL_CAPSULE | Freq: Every day | ORAL | 11 refills | Status: DC

## 2016-10-09 NOTE — Telephone Encounter (Signed)
Please call patient have called in nortriptyline 25  mg 1 tablets every night to her pharmacy

## 2016-10-09 NOTE — Telephone Encounter (Signed)
Patient was seen on 10-07-16 and says a Rx was to be sent to [facility] on [address] [facility] [employer] but it is not at the pharmacy. The patient does not know the name of the medication.

## 2016-10-09 NOTE — Telephone Encounter (Signed)
I have spoken with pt. this morning and explained that YY sent Nortrip to her pharmacy this morning.  She verbalized understanding of same/fim

## 2016-10-22 ENCOUNTER — Ambulatory Visit: Payer: Medicare Other | Admitting: Podiatry

## 2016-10-28 ENCOUNTER — Ambulatory Visit (INDEPENDENT_AMBULATORY_CARE_PROVIDER_SITE_OTHER): Payer: Medicare Other | Admitting: Podiatry

## 2016-10-28 ENCOUNTER — Encounter: Payer: Self-pay | Admitting: Podiatry

## 2016-10-28 VITALS — Ht 64.0 in | Wt 133.0 lb

## 2016-10-28 DIAGNOSIS — L84 Corns and callosities: Secondary | ICD-10-CM | POA: Diagnosis not present

## 2016-10-28 DIAGNOSIS — B351 Tinea unguium: Secondary | ICD-10-CM | POA: Diagnosis not present

## 2016-10-28 DIAGNOSIS — M79676 Pain in unspecified toe(s): Secondary | ICD-10-CM

## 2016-10-28 NOTE — Progress Notes (Signed)
Patient ID: Danielle Peters, female   DOB: 04/29/1941, 75 y.o.   MRN: [id number] Complaint:  Visit Type: [name] for continued preventative foot care services. Complaint: Patient states" my nails have grown long and thick and become painful to walk and wear shoes" . The patient presents for preventative foot care services. No changes to ROS  Podiatric Exam: Vascular: dorsalis pedis and posterior tibial pulses are palpable bilateral. Capillary return is immediate. Temperature gradient is WNL. Skin turgor WNL  Sensorium: Normal Semmes Weinstein monofilament test. Normal tactile sensation bilaterally. Nail Exam: Pt has thick disfigured discolored nails with subungual debris noted bilateral entire nail hallux through fifth toenails Ulcer Exam: There is no evidence of ulcer or pre-ulcerative changes or infection. Orthopedic Exam: Muscle tone and strength are WNL. No limitations in general ROM. No crepitus or effusions noted. Foot type and digits show no abnormalities. Bony prominences are unremarkable. Skin: No Porokeratosis. No infection or ulcers.  Callus distal aspect hallux  B/L.  Diagnosis:  Onychomycosis, , Pain in right toe, pain in left toes,  Callus hallux  B/L  Treatment & Plan Procedures and Treatment: Consent by patient was obtained for treatment procedures. The patient understood the discussion of treatment and procedures well. All questions were answered thoroughly reviewed. Debridement of mycotic and hypertrophic toenails, 1 through 5 bilateral and clearing of subungual debris. No ulceration, no infection noted. Debride hallux callus  B/L. Return Visit-Office Procedure: Patient instructed to return to the office for a follow up visit 3 months for continued evaluation and treatment.  [name] DPM 

## 2016-11-04 ENCOUNTER — Ambulatory Visit: Payer: Medicare Other | Admitting: Podiatry

## 2016-11-16 ENCOUNTER — Ambulatory Visit (INDEPENDENT_AMBULATORY_CARE_PROVIDER_SITE_OTHER): Payer: Medicare Other | Admitting: Cardiology

## 2016-11-16 ENCOUNTER — Encounter: Payer: Self-pay | Admitting: Cardiology

## 2016-11-16 VITALS — BP 123/69 | HR 98 | Ht 64.0 in | Wt 128.0 lb

## 2016-11-16 DIAGNOSIS — I10 Essential (primary) hypertension: Secondary | ICD-10-CM | POA: Diagnosis not present

## 2016-11-16 DIAGNOSIS — I471 Supraventricular tachycardia: Secondary | ICD-10-CM | POA: Diagnosis not present

## 2016-11-16 DIAGNOSIS — R079 Chest pain, unspecified: Secondary | ICD-10-CM

## 2016-11-16 DIAGNOSIS — D869 Sarcoidosis, unspecified: Secondary | ICD-10-CM

## 2016-11-16 DIAGNOSIS — Z79899 Other long term (current) drug therapy: Secondary | ICD-10-CM | POA: Diagnosis not present

## 2016-11-16 LAB — TSH: TSH: 0.99 mIU/L

## 2016-11-16 NOTE — Progress Notes (Signed)
Cardiology Office Note   Date:  11/16/2016   ID:  Freeland, DOB 07-Sep-1941, MRN [id number]  PCP:  Tivis Ringer, MD  Cardiologist:  Dr. Bertrum Sol     Chief Complaint  Patient presents with  . Palpitations  . Dizziness    frequently.      History of Present Illness: Danielle Peters is a 76 y.o. female who presents for LBBB per PCP.   She has a hx of SVT, with ablation for AV node re-entry tachycardia.  Also hx of HTN, lung sarcoidosis, Lt breast lumpectomy and chemotherapy for cancer.   On last echo normal LV systolic dysfunction and no ischemia on stress test.    She has a hx of intermittent LBBB, and palpitations.    Now after starting nortriptyline she began with fluttering along rib.  She also has chest soreness.  The soreness was Lt ant chest comes when she exercises and she stops the exercise it improves never occurs at rest.  No associated symptoms.  EKG with LBBB.  Her "fluttering" has resolved with stopping the nortriptyline and beginning the BB.    She is most frustrated by the tremor.  She has difficulty eating.  We discussed adding mashed potatoes to food to help stick to spoon.       Past Medical History:  Diagnosis Date  . Anemia   . Anxiety   . Cancer ([place])    breast ca  . Chest pain    echo 01/08/10- nl lv; mild aortic sclerosis; myoview 01/08/10- no ischemia  . Dyslipidemia    myaligia with statins  . Esophageal reflux   . Hypertension   . Pulmonary sarcoidosis ([place])   . Shingles 03/2016  . Tremor    upper extremities  . Ventricular tachycardia (paroxysmal) (HCC)    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; AV node reentry tachycardia ablation     Past Surgical History:  Procedure Laterality Date  . Boil Right 03/2015   on buttock, excision  . left breast lumpectomy,chemo  2004   xrt  . SKIN BIOPSY    . UPPER GI ENDOSCOPY  02/2015   difficulty swallowing, Abd pain     Current Outpatient Prescriptions  Medication Sig Dispense Refill  .  acetaminophen (TYLENOL) 325 MG tablet Take 650 mg by mouth every 6 (six) hours as needed for mild pain.    [name] aspirin 81 MG tablet Take 81 mg by mouth every other day.     . diazepam (VALIUM) 2 MG tablet Take 2 mg by mouth every 6 (six) hours as needed for anxiety.    . fluticasone (FLONASE) 50 MCG/ACT nasal spray     . metoprolol tartrate (LOPRESSOR) 25 MG tablet Take 12.5 mg by mouth daily.    . nortriptyline (PAMELOR) 25 MG capsule Take 1 capsule (25 mg total) by mouth at bedtime. 30 capsule 11  . ondansetron (ZOFRAN) 4 MG tablet Take 4 mg by mouth every 8 (eight) hours as needed for nausea.     . traMADol (ULTRAM) 50 MG tablet Take 100 mg by mouth every 8 (eight) hours as needed. for pain  4  . valsartan-hydrochlorothiazide (DIOVAN-HCT) 80-12.5 MG per tablet Take 1 tablet by mouth daily.  4   No current facility-administered medications for this visit.     Allergies:   Dextromethorphan polistirex er; Primidone; Albuterol; Asa [aspirin]; Loratadine; and Simvastatin    Social History:  The patient  reports that she has never smoked. She has never  used smokeless tobacco. She reports that she does not drink alcohol or use drugs.   Family History:  The patient's family history includes Cancer in her mother; Heart attack in her sister; Heart failure (age of onset: 64) in her father.    ROS:  General:no colds or fevers, some weight loss Skin:no rashes or ulcers HEENT:no blurred vision, no congestion CV:see HPI PUL:see HPI GI:no diarrhea constipation or melena, no indigestion GU:no hematuria, no dysuria MS:no joint pain, no claudication, + tremor Neuro:no syncope, no lightheadedness Endo:no diabetes, no thyroid disease  Wt Readings from Last 3 Encounters:  11/16/16 128 lb (58.1 kg)  10/28/16 133 lb (60.3 kg)  10/07/16 133 lb (60.3 kg)     PHYSICAL EXAM: VS:  BP 123/69   Pulse 98   Ht 5\' 4"  (1.626 m)   Wt 128 lb (58.1 kg)   BMI 21.97 kg/m  , BMI Body mass index is 21.97  kg/m. General:Pleasant affect, NAD Skin:Warm and dry, brisk capillary refill HEENT:normocephalic, sclera clear, mucus membranes moist Neck:supple, no JVD, no bruits  Heart:S1S2 RRR without murmur, gallup, rub or click Lungs:without rales, rhonchi, occ wheezes [id number], non tender, + BS, do not palpate liver spleen or masses Ext:no lower ext edema, 2+ pedal pulses, 2+ radial pulses Neuro:alert and oriented X 3, MAE, follows commands, + facial symmetry    EKG:  EKG is ordered today. The ekg ordered today demonstrates  SR at 98 with LBBB.    Recent Labs: 01/02/2016: BUN 15; Creatinine, Ser 1.15; Hemoglobin 13.6; Platelets 162; Potassium 3.9; Sodium 139    Lipid Panel No results found for: CHOL, TRIG, HDL, CHOLHDL, VLDL, LDLCALC, LDLDIRECT     Other studies Reviewed: Additional studies/ records that were reviewed today include: see above..   ASSESSMENT AND PLAN:  1.  Chest pain will do a lexiscan myoview with her LBBB.  She will follow up with Dr. Jerilynn Mages. Croitoru  2. Fluttering has now stopped may be due to addition of BB or just stopping nortriptyline. But continue BB for now with HR at 98  3.   HTN controlled   Current medicines are reviewed with the patient today.  The patient Has no concerns regarding medicines.  The following changes have been made:  See above Labs/ tests ordered today include:see above  Disposition:   FU:  see above  Signed, Cecilie Kicks, NP  11/16/2016 8:49 AM    [facility], [place], [place], [place] Phone: [phone]; Fax: [phone]

## 2016-11-16 NOTE — Patient Instructions (Signed)
Medication Instructions:  Your physician recommends that you continue on your current medications as directed. Please refer to the Current Medication list given to you today.  If you need a refill on your cardiac medications before your next appointment, please call your pharmacy.  Labwork: BMP, MAGNESIUM, TSH TODAY AT [facility] ON THE 1ST FLOOR  Testing/Procedures: Your physician has requested that you have a lexiscan myoview. For further information please visit [email or web address]. Please follow instruction sheet, as given.   Follow-Up: Your physician recommends that you schedule a follow-up appointment in: [place]   Thank you for choosing [facility] at [facility], [place]

## 2016-11-17 LAB — BASIC METABOLIC PANEL
BUN: 15 mg/dL (ref 7–25)
CALCIUM: 10.5 mg/dL — AB (ref 8.6–10.4)
CHLORIDE: 99 mmol/L (ref 98–110)
CO2: 28 mmol/L (ref 20–31)
CREATININE: 0.93 mg/dL (ref 0.60–0.93)
Glucose, Bld: 102 mg/dL — ABNORMAL HIGH (ref 65–99)
Potassium: 4.2 mmol/L (ref 3.5–5.3)
Sodium: 139 mmol/L (ref 135–146)

## 2016-11-17 LAB — MAGNESIUM: Magnesium: 2.2 mg/dL (ref 1.5–2.5)

## 2016-11-20 ENCOUNTER — Telehealth (HOSPITAL_COMMUNITY): Payer: Self-pay

## 2016-11-20 NOTE — Telephone Encounter (Signed)
Encounter complete. 

## 2016-11-25 ENCOUNTER — Ambulatory Visit (HOSPITAL_COMMUNITY)
Admission: RE | Admit: 2016-11-25 | Discharge: 2016-11-25 | Disposition: A | Discharge status: Home / Self Care | Payer: Medicare Other | Source: Ambulatory Visit | Attending: Cardiovascular Disease | Admitting: Cardiovascular Disease

## 2016-11-25 DIAGNOSIS — I471 Supraventricular tachycardia: Secondary | ICD-10-CM | POA: Insufficient documentation

## 2016-11-25 DIAGNOSIS — R079 Chest pain, unspecified: Secondary | ICD-10-CM | POA: Insufficient documentation

## 2016-11-25 LAB — MYOCARDIAL PERFUSION IMAGING
CHL CUP NUCLEAR SSS: 12
CHL CUP RESTING HR STRESS: 83 {beats}/min
CSEPPHR: 110 {beats}/min
LV dias vol: 40 mL (ref 46–106)
LV sys vol: 9 mL
SDS: 5
SRS: 7
TID: 1.15

## 2016-11-25 MED ORDER — REGADENOSON 0.4 MG/5ML IV SOLN
0.4000 mg | Freq: Once | INTRAVENOUS | Status: AC
  Administered 2016-11-25: 0.4 mg via INTRAVENOUS

## 2016-11-25 MED ORDER — TECHNETIUM TC 99M TETROFOSMIN IV KIT
30.5000 | PACK | Freq: Once | INTRAVENOUS | Status: AC | PRN
  Administered 2016-11-25: 30.5 via INTRAVENOUS
  Filled 2016-11-25: qty 31

## 2016-11-25 MED ORDER — TECHNETIUM TC 99M TETROFOSMIN IV KIT
10.4000 | PACK | Freq: Once | INTRAVENOUS | Status: AC | PRN
  Administered 2016-11-25: 10.4 via INTRAVENOUS
  Filled 2016-11-25: qty 11

## 2016-11-25 MED ORDER — AMINOPHYLLINE 25 MG/ML IV SOLN
75.0000 mg | Freq: Once | INTRAVENOUS | Status: AC
  Administered 2016-11-25: 75 mg via INTRAVENOUS

## 2016-12-01 ENCOUNTER — Encounter: Payer: Self-pay | Admitting: Cardiovascular Disease

## 2016-12-01 ENCOUNTER — Ambulatory Visit (INDEPENDENT_AMBULATORY_CARE_PROVIDER_SITE_OTHER): Payer: Medicare Other | Admitting: Cardiovascular Disease

## 2016-12-01 VITALS — BP 120/72 | HR 68 | Ht 64.0 in | Wt 136.2 lb

## 2016-12-01 DIAGNOSIS — I447 Left bundle-branch block, unspecified: Secondary | ICD-10-CM

## 2016-12-01 DIAGNOSIS — E7800 Pure hypercholesterolemia, unspecified: Secondary | ICD-10-CM | POA: Diagnosis not present

## 2016-12-01 DIAGNOSIS — R0789 Other chest pain: Secondary | ICD-10-CM | POA: Diagnosis not present

## 2016-12-01 DIAGNOSIS — I471 Supraventricular tachycardia: Secondary | ICD-10-CM

## 2016-12-01 NOTE — Patient Instructions (Signed)
Dr Sallyanne Kuster recommends that you schedule a follow-up appointment in 12 months. You will receive a reminder letter in the mail two months in advance. If you don't receive a letter, please call our office to schedule the follow-up appointment.  If you need a refill on your cardiac medications before your next appointment, please call your pharmacy.   Drink more water!!!

## 2016-12-01 NOTE — Progress Notes (Signed)
Cardiology Office Note    Date:  12/01/2016   ID:  [place], DOB 02-Jul-1941, MRN [id number]  PCP:  Tivis Ringer, MD  Cardiologist:   Sanda Klein, MD   Chief Complaint  Patient presents with  . Follow-up    post Lex Myo  pt c/o dizziness from medications and cramping in lower extremities when laying down at night    History of Present Illness:  Danielle Peters is a 76 y.o. female with history of successful RFA for AVNRT, HTN, intermittent left bundle branch block, hypercholesterolemia, pulmonary sarcoidosis, essential tremor, history of left breast cancer with lumpectomy and chemotherapy presents in follow-up after undergoing a nuclear stress test for chest discomfort. The nuclear scan shows normal perfusion and normal left ventricle systolic function. On review her symptoms sound more like palpitations that occurred after initiation of treatment with nortriptyline. Palpitations improved after discontinuing the nortriptyline and starting a very low dose of beta blocker. The palpitations don't bother her at all anymore and she wants to stop the metoprolol. Metoprolol has not helped her tremor at all. She also complains of cramps at night or early in the morning when she stretches in bed.  Recent labs showed no major electrolyte abnormalities. Her total cholesterol and LDL cholesterol remains severely elevated, but she has been intolerant of numerous statins. She does not have known coronary or peripheral vascular disease.  Past Medical History:  Diagnosis Date  . Anemia   . Anxiety   . Cancer ([place])    breast ca  . Chest pain    echo 01/08/10- nl lv; mild aortic sclerosis; myoview 01/08/10- no ischemia  . Dyslipidemia    myaligia with statins  . Esophageal reflux   . Hypertension   . Pulmonary sarcoidosis ([place])   . Shingles 03/2016  . Tremor    upper extremities  . Ventricular tachycardia (paroxysmal) (HCC)    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; AV node reentry  tachycardia ablation     Past Surgical History:  Procedure Laterality Date  . Boil Right 03/2015   on buttock, excision  . left breast lumpectomy,chemo  2004   xrt  . SKIN BIOPSY    . UPPER GI ENDOSCOPY  02/2015   difficulty swallowing, Abd pain    Current Medications: Outpatient Medications Prior to Visit  Medication Sig Dispense Refill  . acetaminophen (TYLENOL) 325 MG tablet Take 650 mg by mouth every 6 (six) hours as needed for mild pain.    [name] aspirin 81 MG tablet Take 81 mg by mouth every other day.     . diazepam (VALIUM) 2 MG tablet Take 2 mg by mouth every 6 (six) hours as needed for anxiety.    . fluticasone (FLONASE) 50 MCG/ACT nasal spray Place into both nostrils as needed.     . metoprolol tartrate (LOPRESSOR) 25 MG tablet Take 12.5 mg by mouth daily.    . ondansetron (ZOFRAN) 4 MG tablet Take 4 mg by mouth every 8 (eight) hours as needed for nausea.     . traMADol (ULTRAM) 50 MG tablet Take 100 mg by mouth every 8 (eight) hours as needed. for pain  4  . valsartan-hydrochlorothiazide (DIOVAN-HCT) 80-12.5 MG per tablet Take 1 tablet by mouth daily.  4  . nortriptyline (PAMELOR) 25 MG capsule Take 1 capsule (25 mg total) by mouth at bedtime. 30 capsule 11   No facility-administered medications prior to visit.      Allergies:   Dextromethorphan polistirex er; Primidone; Albuterol;  Asa [aspirin]; Loratadine; and Simvastatin   Social History   Social History  . Marital status: Divorced    Spouse name: N/A  . Number of children: 2  . 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 Smoking status: Never Smoker  . Smokeless tobacco: Never Used  . Alcohol use No  . Drug use: No  . Sexual activity: Not Asked   Other Topics Concern  . None   Social History Narrative   Patient lives at home alone. Patient is retired.Pateint has high school education. And went to [employer].   Right handed.   Caffeine-  one cup daily.     Family History:  The patient's family history includes Cancer in her mother; Heart attack in her sister; Heart failure (age of onset: 64) in her father.   ROS:   Please see the history of present illness.    ROS All other systems reviewed and are negative.   PHYSICAL EXAM:   VS:  BP 120/72 (BP Location: Left Arm, Patient Position: Sitting, Cuff Size: Normal)   Pulse 68   Ht 5\' 4"  (1.626 m)   Wt 61.8 kg (136 lb 3.2 oz)   BMI 23.38 kg/m    GEN: Well nourished, well developed, in no acute distress  HEENT: normal  Neck: no JVD, carotid bruits, or masses Cardiac: RRR; no murmurs, rubs, or gallops,no edema  Respiratory:  clear to auscultation bilaterally, normal work of breathing GI: soft, nontender, nondistended, + BS MS: no deformity or atrophy  Skin: warm and dry, no rash Neuro:  Alert and Oriented x 3, Strength and sensation are intact. Resting and intentional tremor is present Psych: euthymic mood, full affect  Wt Readings from Last 3 Encounters:  12/01/16 61.8 kg (136 lb 3.2 oz)  11/25/16 58.1 kg (128 lb)  11/16/16 58.1 kg (128 lb)      Studies/Labs Reviewed:   EKG:  EKG is not ordered today.  The ekg ordered 11/16/2016 demonstrates sinus rhythm, left bundle branch block  Recent Labs: 01/02/2016: Hemoglobin 13.6; Platelets 162 11/16/2016: BUN 15; Creat 0.93; Magnesium 2.2; Potassium 4.2; Sodium 139; TSH 0.99   Lipid Panel 02/20/2016 total cholesterol 257, HDL 65, LDL 176, triglycerides 80, glucose 102, creatinine 0.93, TSH 0.99   ASSESSMENT:    1. Atypical chest pain   2. LBBB (left bundle branch block)   3. Hypercholesterolemia   4. Paroxysmal supraventricular tachycardia (HCC)      PLAN:  In order of problems listed above:  1. Palpitations and chest pain: Have resolved, okay to discontinue metoprolol. Can use low-dose metoprolol as needed for recurrent palpitations. 2. LBBB: Seems to be rate related, no evidence of high-grade AV block  clinically, no evidence of underlying structural heart disease by echo and nuclear stress test. 3. HLP: Severely elevated LDL cholesterol but intolerant of statins. In the absence of coronary vascular disease I don't think she meets criteria for PCSK9 inhibitors . 4. SVT: No recurrence following AV node modification.    Medication Adjustments/Labs and Tests Ordered: Current medicines are reviewed at length with the patient today.  Concerns regarding medicines are outlined above.  Medication changes, Labs and Tests ordered today are listed in the Patient Instructions below. Patient Instructions  Dr Sallyanne Kuster recommends that you schedule a follow-up appointment in 12 months. You will receive a reminder letter in the mail two months in advance. If you don't receive a letter,  please call our office to schedule the follow-up appointment.  If you need a refill on your cardiac medications before your next appointment, please call your pharmacy.   Drink more water!!!    Signed, Sanda Klein, MD  12/01/2016 11:39 AM    [facility] [place], [place], [place]  [address] Phone: [phone]; Fax: [phone]

## 2016-12-31 ENCOUNTER — Ambulatory Visit (INDEPENDENT_AMBULATORY_CARE_PROVIDER_SITE_OTHER): Payer: Medicare Other | Admitting: Internal Medicine

## 2016-12-31 ENCOUNTER — Ambulatory Visit (INDEPENDENT_AMBULATORY_CARE_PROVIDER_SITE_OTHER)
Admission: RE | Admit: 2016-12-31 | Discharge: 2016-12-31 | Disposition: A | Discharge status: Home / Self Care | Payer: Medicare Other | Source: Ambulatory Visit | Attending: Internal Medicine | Admitting: Internal Medicine

## 2016-12-31 ENCOUNTER — Encounter: Payer: Self-pay | Admitting: Internal Medicine

## 2016-12-31 VITALS — BP 122/78 | HR 95 | Ht 63.5 in | Wt 126.8 lb

## 2016-12-31 DIAGNOSIS — J302 Other seasonal allergic rhinitis: Secondary | ICD-10-CM

## 2016-12-31 DIAGNOSIS — J449 Chronic obstructive pulmonary disease, unspecified: Secondary | ICD-10-CM | POA: Diagnosis not present

## 2016-12-31 DIAGNOSIS — J42 Unspecified chronic bronchitis: Secondary | ICD-10-CM | POA: Diagnosis not present

## 2016-12-31 DIAGNOSIS — D869 Sarcoidosis, unspecified: Secondary | ICD-10-CM

## 2016-12-31 DIAGNOSIS — J3089 Other allergic rhinitis: Secondary | ICD-10-CM | POA: Diagnosis not present

## 2016-12-31 NOTE — Progress Notes (Signed)
HPI  Female never smoker followed for sarcoid, chronic bronchitis complicated by history of right breast cancer/ XRT, tachycardia, GERD, anxiety, glaucoma, tremor.[name]  PCP Dr Lucien Mons Med Cardiac Study 11/25/16- EF 77%, hyperdynamic, otw  WNL Office Spirometry 12/31/2016-mild restriction of exhaled volume. FVC 1.61/77%, FEV1 1.37/86%, ratio 0.85, FEF 25-75% 2.00/ 144% -----------------------------------------------------------------------------------------.  08/20/2016-76 year old female never smoker followed for sarcoid, chronic bronchitis, accompanied by history right breast CA/XRT, Tachycardia, GERD, anxiety, glaucoma, tremor Pt states breathing is ok, feels like she has a cold or allergy changes. Pt states no congestion, tightness or SOB.Pt states coughing up white mucus in the mornings and evenings. Discuss the flu shot  She doesn't feel acutely ill and says cough is within her usual range, typically a little worse in fall season. Scant clear mucus with no blood. No fever or sweat. She didn't like the taste of cough syrups-Cheratussin-but asks about trying something else. We suggested trial of Hycodan. Still has postherpetic pain right arm after shingles earlier this year.  12/31/2016- 76 year old female never smoker followed for sarcoid, chronic bronchitis, accompanied by history right breast CA/XRT, Tachycardia, GERD, anxiety, glaucoma, tremor FOLLOWS FOR: Pt notes some increased SOB - feels that her allergies mihgt be contributing to her SOB and causing cough and wheezing. Cough is dry. Denies any chest tightness.  Eye doctor gave her Systane lubricant for burning and itching eyes. Her tremor makes use of this mechanically difficult. She does have Claritin which she has not been taking recently. We discussed onset of spring tree pollen season. She dislikes nasal sprays. Vague diffuse soreness through back and rib cage especially upper anterior chest. Some dry cough. Denies fever, adenopathy,  rash. Office Spirometry 12/31/2016-mild restriction of exhaled volume. FVC 1.61/77%, FEV1 1.37/86%, ratio 0.85, FEF 25-75% 2.00/ 144%  ROS- see HPI Constitutional:   No-   weight loss, night sweats, fevers, chills,+ fatigue, lassitude. HEENT:   No-  headaches, difficulty swallowing, tooth/dental problems, sore throat,       No-  sneezing, itching, ear ache, nasal congestion, post nasal drip,  CV:   +chest pain, orthopnea, PND, swelling in lower extremities, anasarca, dizziness, palpitations Resp: No-   shortness of breath with exertion or at rest.              +productive cough,  + non-productive cough,  No-  coughing up of blood.              No-   change in color of mucus.  No- wheezing.   Skin: no-pruritus or rash  GI:  No-   heartburn, indigestion, abdominal pain, nausea, vomiting,  GU:. MS:  No-   joint pain or swelling.  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 No depression or anxiety.  No memory loss.  Obj General- Alert, Oriented, Affect-appropriate, Distress- none acute                    + Always slumped, almost kyphotic posture Skin- + hyperpigmentation right forearm attributed to shingles Lymphadenopathy- none Head- atraumatic            Eyes- Gross vision intact, PERRLA, conjunctivae clear secretions            Ears- Hearing, canals-normal            Nose- Clear, no-Septal dev, mucus, polyps, erosion, perforation             Throat- Mallampati II , mucosa clear , drainage- none,  tonsils- atrophic. +Missing teeth Neck- flexible , trachea midline, no stridor , thyroid nl, carotid no bruit Chest - symmetrical excursion , unlabored           Heart/CV- RRR with no extra beats , no murmur , no gallop  , no rub, nl s1 s2                - JVD- none , edema- none, stasis changes- none, varices- none           Lung- + bilateral mild squeaks ,  Work of breathing is not increased.   cough- none ,                           dullness-none,  rub- none           Chest wall- +treatment Scar R breast, L upper anterior chest, + there is no rub and no significant tenderness to mild pressure Abd- No HSM Br/ Gen/ Rectal- Not done, not indicated Extrem- cyanosis- none, clubbing, none, atrophy- none, strength- nl Neuro- +Tremor .

## 2016-12-31 NOTE — Patient Instructions (Signed)
Order- cxr  Dx sarcoid, chronic bronchitis  Order- Office spirometry  Ok to use claritin for spring allergy  You could ask Dr Dagmar Hait if low dose propranalol would be worth trying for your tremor, since shaky hands make it hard for you to use eye drops.  Please call if we can help

## 2016-12-31 NOTE — Assessment & Plan Note (Signed)
She may be coughing just a little more than usual, but she did not cough at all with deep breaths during exam today and chest exam was stable for her. We reviewed medications Office spirometry showed mild restriction of exhaled volume but well preserved flow rates. Plan-CXR,

## 2016-12-31 NOTE — Assessment & Plan Note (Signed)
I don't expect reactivation of her disease but we will watch for additional diagnoses with overlapping symptoms as needed. Plan-CXR

## 2016-12-31 NOTE — Assessment & Plan Note (Signed)
Her eye doctor gave her lubricant with implication dry eyes may be preferred diagnosis for eye symptoms. She is okay to use Claritin if needed for nasal symptoms with suggestion that she doesn't need it every day and might even be able to use half-pills to avoid overdrying.

## 2017-01-05 ENCOUNTER — Ambulatory Visit (INDEPENDENT_AMBULATORY_CARE_PROVIDER_SITE_OTHER): Payer: Medicare Other | Admitting: Neurology

## 2017-01-05 ENCOUNTER — Encounter: Payer: Self-pay | Admitting: Neurology

## 2017-01-05 VITALS — BP 136/71 | HR 99 | Ht 63.5 in | Wt 127.0 lb

## 2017-01-05 DIAGNOSIS — M5416 Radiculopathy, lumbar region: Secondary | ICD-10-CM

## 2017-01-05 DIAGNOSIS — G250 Essential tremor: Secondary | ICD-10-CM | POA: Diagnosis not present

## 2017-01-05 MED ORDER — PRIMIDONE 50 MG PO TABS: 50.0000 mg | ORAL_TABLET | Freq: Three times a day (TID) | ORAL | 11 refills | Status: DC

## 2017-01-05 NOTE — Progress Notes (Signed)
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 She presenting with few years history of bilateral hands tremor, getting worse when she stretched out her hands, or using utensils, she has describe difficulty feeding herself, cause a lot of social embarrasement, she denies significant gait difficulty, she also has head titubation She denied bilateral lower exremity paresthesia weakness, but does complain fingertips numbness tingling. She denied loss of smell, REM sleep disorder, has anxiety symptoms, denied family history of tremor Over the past few years, we have tried primadone, while taking 50mg  1/2 once a day , there was no significant improvement at that time, she also complains of headaches, but also tried diazepam, Ativan, without significant improvement UPDATE Oct 08 2014:  She is no longer taking primidone, she is taking clonazepam 0.5 mg half to one tablets every morning, which has helped her tremor, she complains of anxiety, but does not want to take more medications for it  UPDATE Oct 07 2016:  She took clonazepam 0.5 milligrams half to 1 tablet as needed for tremor for a while without significant improvement, she continue have worsening bilateral hands resting and posturing tremor, occasionally involving bilateral lower extremity, she denies significant gait abnormality, she exercise regularly, she complains of recent onset of left-sided low back pain, radiating pain to left lower extremity, left calf muscle. She is taking Lyrica 75 mg a day  She  just healed from right arm shingles in June 2017, still has intermittent itching in her right arm and hand,  UPDATE January 05 2017: She complains of constant bilateral hand resting and posturing tremor, previously tried primidone, clonazepam with limited help, currently she is taking Valium 2 mg as needed, which helps her some, she previously took metoprolol low-dose did not notice any help for her tremor,  This is most consistent with essential tremor, there was no family history of similar disease, no parkinsonian features.  Her low back pain has much improved, she has stopped taking nortriptyline   REVIEW OF SYSTEMS: Full 14 system review of systems performed and notable only for those listed, all others are neg:     ALLERGIES: Allergies  Allergen Reactions  . Dextromethorphan Polistirex Er Other (See Comments)    SOB  . Primidone     Severe Headaches  . Albuterol     REACTION: tachycardia  . Asa [Aspirin]     Adult dose  . Loratadine   . Nortriptyline     Increase heart rate.  . Simvastatin     Myalgias     HOME MEDICATIONS: Outpatient Medications Prior to Visit  Medication Sig Dispense Refill  . acetaminophen (TYLENOL) 325 MG tablet Take 650 mg by mouth every 6 (six) hours as needed for mild pain.    [name] aspirin 81 MG tablet Take 81 mg by mouth every other day.     . diazepam (VALIUM) 2 MG tablet Take 2 mg by mouth every 6 (six) hours as needed for anxiety.    . metoprolol tartrate (LOPRESSOR) 25 MG tablet Take 12.5 mg by mouth daily.    . ondansetron (ZOFRAN) 4 MG tablet Take 4 mg by mouth every 8 (eight) hours as  needed for nausea.     . traMADol (ULTRAM) 50 MG tablet Take 100 mg by mouth every 8 (eight) hours as needed. for pain  4  . valsartan-hydrochlorothiazide (DIOVAN-HCT) 80-12.5 MG per tablet Take 1 tablet by mouth daily.  4  . fluticasone (FLONASE) 50 MCG/ACT nasal spray Place into both nostrils as needed.     . nortriptyline (PAMELOR) 25 MG capsule Take 25 mg  by mouth at bedtime.  11   No facility-administered medications prior to visit.     PAST MEDICAL HISTORY: Past Medical History:  Diagnosis Date  . Anemia   . Anxiety   . Cancer ([place])    breast ca  . Chest pain    echo 01/08/10- nl lv; mild aortic sclerosis; myoview 01/08/10- no ischemia  . Dyslipidemia    myaligia with statins  . Esophageal reflux   . Hypertension   . Pulmonary sarcoidosis ([place])   . Shingles 03/2016  . Tremor    upper extremities  . Ventricular tachycardia (paroxysmal) (HCC)    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; AV node reentry tachycardia ablation     PAST SURGICAL HISTORY: Past Surgical History:  Procedure Laterality Date  . Boil Right 03/2015   on buttock, excision  . left breast lumpectomy,chemo  2004   xrt  . SKIN BIOPSY    . UPPER GI ENDOSCOPY  02/2015   difficulty swallowing, Abd pain    FAMILY HISTORY: Family History  Problem Relation Age of Onset  . Heart failure Father 30  . Cancer Mother   . Heart attack Sister     2 heart attacks    SOCIAL HISTORY: Social History   Social History  . Marital status: Divorced    Spouse name: N/A  . Number of children: 2  . 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 Smoking status: Never Smoker  . Smokeless tobacco: Never Used  . Alcohol use No  . Drug use: No  . Sexual activity: Not on file   Other Topics Concern  . Not on file   Social History Narrative   Patient lives at home alone. Patient is retired.Pateint has high school education. And went to [employer].   Right handed.   Caffeine- one cup daily.     PHYSICAL EXAM  Vitals:   01/05/17 0849  BP: 136/71  Pulse: 99  Weight: 127 lb (57.6 kg)  Height: 5' 3.5" (1.613 m)   Body mass index is 22.14 kg/m. Generalized: Well developed, in no acute distress  Head: normocephalic and atraumatic,. Oropharynx benign  Neck: Supple, no carotid bruits  Cardiac: Regular rate  rhythm, no murmur  Musculoskeletal: No deformity   Neurological examination She has mild anterocollis  Mentation: Alert oriented to time, place, history taking. Follows all commands speech and language fluent  Cranial nerve II-XII: Pupils were equal round reactive to light extraocular movements were full, visual field were full on confrontational test. Facial sensation and strength were normal. hearing loss left ear. Uvula tongue midline. head turning and shoulder shrug and were normal and symmetric.Tongue protrusion into cheek strength was normal. She has occasional head No-No tremor. Motor: normal bulk and tone, full strength in bilateral upper and lower extremity muscles, she has bilateral upper extremity resting and postural tremor, right more than left, There was no significant rigidity, No bradykinesia Sensory: normal and symmetric to light touch, pinprick, and vibration  Coordination: finger-nose-finger, heel-to-shin  bilaterally, no dysmetria Reflexes: Brachioradialis 2/2, biceps 2/2, triceps 2/2, patellar 2/2, Achilles 2/2, plantar responses were flexor bilaterally. Gait and Station: Rising up from seated position without assistance, Leaning forward, anterocollis, mildly unsteady, moderate stride, smooth turning  DIAGNOSTIC DATA (LABS, IMAGING, TESTING) - I reviewed patient records, labs, notes, testing and imaging myself where available.  Lab Results  Component Value Date   WBC 7.5 01/02/2016   HGB 13.6 01/02/2016   HCT 42.1 01/02/2016   MCV 85.9 01/02/2016   PLT 162 01/02/2016      Component Value Date/Time   NA 139 11/16/2016 1001   K 4.2 11/16/2016 1001   CL 99 11/16/2016 1001   CO2 28 11/16/2016 1001   GLUCOSE 102 (H) 11/16/2016 1001   BUN 15 11/16/2016 1001   CREATININE 0.93 11/16/2016 1001   CALCIUM 10.5 (H) 11/16/2016 1001   PROT 7.6 05/05/2010 1438   ALBUMIN 3.9 05/05/2010 1438   AST 29 05/05/2010 1438   ALT 19 05/05/2010 1438   ALKPHOS 105 05/05/2010  1438   BILITOT 1.0 05/05/2010 1438   GFRNONAA 45 (L) 01/02/2016 1058   GFRAA 52 (L) 01/02/2016 1058       ASSESSMENT AND PLAN 76 y.o. year old female  Essential tremor:  Resting and posturing component  No evidence of parkinsonian features,  Previously she has tried and failed clonazepam, primidone, low dose beta blocker,  She wants to try primidone again, provide a prescription 50 mg 3 times a day,  Left low back pain radiating pain to left lower extremity  Has much improved, I have encouraged her continue moderate exercise   Marcial Pacas, M.D. Ph.D.  [facility] [place], [place] [address] Phone: [phone] Fax:      [phone]

## 2017-02-22 DIAGNOSIS — Z0000 Encounter for general adult medical examination without abnormal findings: Secondary | ICD-10-CM | POA: Insufficient documentation

## 2017-03-08 DIAGNOSIS — J449 Chronic obstructive pulmonary disease, unspecified: Secondary | ICD-10-CM | POA: Insufficient documentation

## 2017-03-08 DIAGNOSIS — E46 Unspecified protein-calorie malnutrition: Secondary | ICD-10-CM | POA: Insufficient documentation

## 2017-03-10 ENCOUNTER — Encounter: Payer: Self-pay | Admitting: Podiatry

## 2017-03-10 ENCOUNTER — Ambulatory Visit (INDEPENDENT_AMBULATORY_CARE_PROVIDER_SITE_OTHER): Payer: Medicare Other | Admitting: Podiatry

## 2017-03-10 DIAGNOSIS — M79676 Pain in unspecified toe(s): Secondary | ICD-10-CM | POA: Diagnosis not present

## 2017-03-10 DIAGNOSIS — L84 Corns and callosities: Secondary | ICD-10-CM

## 2017-03-10 DIAGNOSIS — B351 Tinea unguium: Secondary | ICD-10-CM | POA: Diagnosis not present

## 2017-03-10 NOTE — Progress Notes (Signed)
Patient ID: Danielle Peters, female   DOB: 01/25/1941, 76 y.o.   MRN: [id number] Complaint:  Visit Type: [name] for continued preventative foot care services. Complaint: Patient states" my nails have grown long and thick and become painful to walk and wear shoes" . The patient presents for preventative foot care services. No changes to ROS  Podiatric Exam: Vascular: dorsalis pedis and posterior tibial pulses are palpable bilateral. Capillary return is immediate. Temperature gradient is WNL. Skin turgor WNL  Sensorium: Normal Semmes Weinstein monofilament test. Normal tactile sensation bilaterally. Nail Exam: Pt has thick disfigured discolored nails with subungual debris noted bilateral entire nail hallux through fifth toenails Ulcer Exam: There is no evidence of ulcer or pre-ulcerative changes or infection. Orthopedic Exam: Muscle tone and strength are WNL. No limitations in general ROM. No crepitus or effusions noted. Foot type and digits show no abnormalities. Bony prominences are unremarkable. Skin: No Porokeratosis. No infection or ulcers.  Callus distal aspect hallux  B/L.  Diagnosis:  Onychomycosis, , Pain in right toe, pain in left toes,  Callus hallux  B/L  Treatment & Plan Procedures and Treatment: Consent by patient was obtained for treatment procedures. The patient understood the discussion of treatment and procedures well. All questions were answered thoroughly reviewed. Debridement of mycotic and hypertrophic toenails, 1 through 5 bilateral and clearing of subungual debris. No ulceration, no infection noted. Debride hallux callus  B/L. Return Visit-Office Procedure: Patient instructed to return to the office for a follow up visit 3 months for continued evaluation and treatment.  Gardiner Barefoot DPM

## 2017-03-16 ENCOUNTER — Telehealth: Payer: Self-pay | Admitting: *Deleted

## 2017-03-16 NOTE — Telephone Encounter (Signed)
Lab results from PCP (collected on 02/22/17 - results being scanned to chart):  Creatinine: 29.0 (L) Creat Ratio: 17.2 (wnl)  Urinalysis (wnl)  CMP: Glucose: 112 (H) (all other values wnl)  CBC: RDW: 12.0 (L) (all other values wnl)  LIPID: Cholesterol: 271 (H) Triglycerides: 114  HDL: 64 (H) LDL: 184  Chol/HDL Ratio: 4.2 LDL/HDL Ratio: 2.9 (H) Non-HDL: 207.0  TSH: 0.50 (wnl)

## 2017-04-08 ENCOUNTER — Ambulatory Visit (INDEPENDENT_AMBULATORY_CARE_PROVIDER_SITE_OTHER): Payer: Self-pay | Admitting: Podiatry

## 2017-04-08 ENCOUNTER — Encounter: Payer: Self-pay | Admitting: Podiatry

## 2017-04-08 DIAGNOSIS — M2042 Other hammer toe(s) (acquired), left foot: Secondary | ICD-10-CM

## 2017-04-08 DIAGNOSIS — L84 Corns and callosities: Secondary | ICD-10-CM

## 2017-04-08 NOTE — Progress Notes (Signed)
Patient ID: Danielle Peters, female   DOB: 29-Jan-1941, 76 y.o.   MRN: [id number] Complaint:  Visit Type: [name] for continued preventative foot care services. Complaint: Patient states" her fifth toe has been severely painful since she was last seen in this office in late May. She states that the fifth toe of the left foot is throbbing and painful during sleep.  She says this been very painful and she believes it is related to the manner in which the nails were cut in her maybe visit.  She has provided no self treatment nor sought any professional help for this condition  Podiatric Exam: Vascular: dorsalis pedis and posterior tibial pulses are palpable bilateral. Capillary return is immediate. Temperature gradient is WNL. Skin turgor WNL  Sensorium: Normal Semmes Weinstein monofilament test. Normal tactile sensation bilaterally. Nail Exam: Pt has thick disfigured discolored nails with subungual debris noted bilateral entire nail hallux through fifth toenails Ulcer Exam: There is no evidence of ulcer or pre-ulcerative changes or infection. Orthopedic Exam: Muscle tone and strength are WNL. No limitations in general ROM. No crepitus or effusions noted. Hammer toe fifth toe left foot. Skin: No Porokeratosis. No infection or ulcers.  Callus distal aspect hallux  B/L. painful listers corn noted on the lateral aspect of the fifth toenail left foot.  No evidence of any redness or swelling.  Diagnosis:  Listers corn fifth toe left foot.  Treatment & Plan Procedures and Treatment: Consent by patient was obtained for treatment procedures. The patient understood the discussion of treatment and procedures well. All questions were answered thoroughly reviewed. Debridement of mycotic and hypertrophic toenails, 1 through 5 bilateral and clearing of subungual debris. No ulceration, no infection noted. Debridement of listers corn.  Padding was dispensed.  Topical pain medicine prescribed from  the compounding pharmacy. Return Visit-Office Procedure: Patient instructed to return to the office for a follow up visit 3 months for continued evaluation and treatment.  Gardiner Barefoot DPM

## 2017-04-15 ENCOUNTER — Telehealth: Payer: Self-pay | Admitting: Podiatry

## 2017-04-15 NOTE — Telephone Encounter (Signed)
Yes, I was seen by Dr. Prudence Davidson last Friday and an Rx was supposed to be sent to [facility]. I still have not received anything and its been almost a week. I would like a call back to know what is going on.

## 2017-04-15 NOTE — Telephone Encounter (Addendum)
[place] phone busy. [place] phone busy. Faxed prescription again to [facility] with note to contact pt concerning the rx. Unable to fax voice message came over the fax machine to try again later.

## 2017-04-28 ENCOUNTER — Other Ambulatory Visit: Payer: Self-pay | Admitting: Internal Medicine

## 2017-04-28 DIAGNOSIS — Z1231 Encounter for screening mammogram for malignant neoplasm of breast: Secondary | ICD-10-CM

## 2017-05-07 ENCOUNTER — Other Ambulatory Visit: Payer: Self-pay | Admitting: Internal Medicine

## 2017-05-19 ENCOUNTER — Other Ambulatory Visit: Payer: Self-pay | Admitting: Internal Medicine

## 2017-05-19 ENCOUNTER — Ambulatory Visit
Admission: RE | Admit: 2017-05-19 | Discharge: 2017-05-19 | Disposition: A | Discharge status: Home / Self Care | Payer: Self-pay | Source: Ambulatory Visit | Attending: Internal Medicine | Admitting: Internal Medicine

## 2017-05-19 DIAGNOSIS — D869 Sarcoidosis, unspecified: Secondary | ICD-10-CM

## 2017-05-21 ENCOUNTER — Ambulatory Visit (INDEPENDENT_AMBULATORY_CARE_PROVIDER_SITE_OTHER): Payer: Medicare Other | Admitting: Podiatry

## 2017-05-21 ENCOUNTER — Ambulatory Visit (INDEPENDENT_AMBULATORY_CARE_PROVIDER_SITE_OTHER): Payer: Medicare Other

## 2017-05-21 ENCOUNTER — Encounter: Payer: Self-pay | Admitting: Podiatry

## 2017-05-21 DIAGNOSIS — M779 Enthesopathy, unspecified: Secondary | ICD-10-CM | POA: Diagnosis not present

## 2017-05-21 DIAGNOSIS — M7662 Achilles tendinitis, left leg: Secondary | ICD-10-CM

## 2017-05-21 DIAGNOSIS — M7661 Achilles tendinitis, right leg: Secondary | ICD-10-CM | POA: Diagnosis not present

## 2017-05-21 MED ORDER — TRIAMCINOLONE ACETONIDE 10 MG/ML IJ SUSP
10.0000 mg | Freq: Once | INTRAMUSCULAR | Status: AC
  Administered 2017-05-21: 10 mg

## 2017-05-21 NOTE — Patient Instructions (Signed)

## 2017-05-21 NOTE — Progress Notes (Signed)
Subjective:    Patient ID: Danielle Peters, female   DOB: 76 y.o.   MRN: [id number]   HPI patient presents stating she has a lot of pain in the back of her heels that is making wearing shoe gear and sleeping difficult due to the discomfort and she is desperate for some form of relief.    ROS      Objective:  Physical Exam neurovascular status intact negative Homans sign noted good Achilles tendon strength and motion noted. On the medial side of the Achilles at the insertion calcaneus there is inflammation fluid buildup bilateral with mild discomfort central lateral. The musculotendinous junction is good on both feet     Assessment:    Achilles tendinitis with inflammation at the medial insertion to the calcaneus bilateral     Plan:   H&P condition reviewed and discussed. She is desperate for any form of treatment I did discuss injection treatment and I explained the risk of rupture associated with injection. Patient's willing to accept this risk understanding the risk and today I did a sterile prep of the medial side of the posterior heel bilateral injected 3 mg dexamethasone Kenalog 5 mill grams Xylocaine being careful not to put it directly into the tendon to keep it medial. I then discussed reduced activity ice therapy and I dispensed a silicone padding to take pressure off the back of the heel. Reappoint to recheck  X-rays indicate that there is small spur with no indication of stress fracture

## 2017-06-04 ENCOUNTER — Ambulatory Visit
Admission: RE | Admit: 2017-06-04 | Discharge: 2017-06-04 | Disposition: A | Discharge status: Home / Self Care | Payer: Medicare Other | Source: Ambulatory Visit | Attending: Internal Medicine | Admitting: Internal Medicine

## 2017-06-04 DIAGNOSIS — Z1231 Encounter for screening mammogram for malignant neoplasm of breast: Secondary | ICD-10-CM

## 2017-06-04 HISTORY — DX: Personal history of irradiation: Z92.3

## 2017-06-04 HISTORY — DX: Personal history of antineoplastic chemotherapy: Z92.21

## 2017-06-07 ENCOUNTER — Ambulatory Visit (INDEPENDENT_AMBULATORY_CARE_PROVIDER_SITE_OTHER): Payer: Medicare Other | Admitting: Podiatry

## 2017-06-07 ENCOUNTER — Encounter: Payer: Self-pay | Admitting: Podiatry

## 2017-06-07 DIAGNOSIS — M7661 Achilles tendinitis, right leg: Secondary | ICD-10-CM | POA: Diagnosis not present

## 2017-06-07 DIAGNOSIS — M7662 Achilles tendinitis, left leg: Secondary | ICD-10-CM | POA: Diagnosis not present

## 2017-06-07 DIAGNOSIS — L84 Corns and callosities: Secondary | ICD-10-CM | POA: Diagnosis not present

## 2017-06-07 NOTE — Patient Instructions (Signed)

## 2017-06-09 NOTE — Progress Notes (Signed)
Subjective:    Patient ID: Danielle Peters Class, female   DOB: 76 y.o.   MRN: [id number]   HPI patient presents with chronic Achilles tendinitis and also corn callus formation on the back of the left heel    ROS      Objective:  Physical Exam neurovascular status intact negative Homans sign was noted with patient's posterior aspect left heel on the medial side found to be keratotic painful when pressed with continued inflammation noted     Assessment:    Achilles tendinitis with possibility for corn callus formation that's part of this problem     Plan:   H&P Achilles tendinitis discussed with consideration for continued range of motion exercises heel lifts and ice therapy. Sharp sterile debridement done of lesion today to see if this will give her relief of symptoms still present

## 2017-06-16 ENCOUNTER — Ambulatory Visit: Payer: Medicare Other | Admitting: Podiatry

## 2017-07-05 ENCOUNTER — Encounter: Payer: Self-pay | Admitting: Internal Medicine

## 2017-07-05 ENCOUNTER — Ambulatory Visit (INDEPENDENT_AMBULATORY_CARE_PROVIDER_SITE_OTHER): Payer: Medicare Other | Admitting: Internal Medicine

## 2017-07-05 VITALS — BP 120/70 | HR 97 | Ht 63.5 in | Wt 120.8 lb

## 2017-07-05 DIAGNOSIS — I471 Supraventricular tachycardia: Secondary | ICD-10-CM | POA: Diagnosis not present

## 2017-07-05 DIAGNOSIS — J441 Chronic obstructive pulmonary disease with (acute) exacerbation: Secondary | ICD-10-CM | POA: Diagnosis not present

## 2017-07-05 DIAGNOSIS — D869 Sarcoidosis, unspecified: Secondary | ICD-10-CM | POA: Diagnosis not present

## 2017-07-05 MED ORDER — FLUTICASONE FUROATE-VILANTEROL 100-25 MCG/INH IN AEPB: 1.0000 | INHALATION_SPRAY | Freq: Every day | RESPIRATORY_TRACT | 0 refills | Status: DC

## 2017-07-05 MED ORDER — FLUTICASONE FUROATE-VILANTEROL 100-25 MCG/INH IN AEPB: INHALATION_SPRAY | RESPIRATORY_TRACT | 12 refills | Status: DC

## 2017-07-05 MED ORDER — METHYLPREDNISOLONE ACETATE 80 MG/ML IJ SUSP
80.0000 mg | Freq: Once | INTRAMUSCULAR | Status: AC
  Administered 2017-07-05: 80 mg via INTRAMUSCULAR

## 2017-07-05 MED ORDER — LEVALBUTEROL HCL 0.63 MG/3ML IN NEBU
0.6300 mg | INHALATION_SOLUTION | Freq: Once | RESPIRATORY_TRACT | Status: AC
  Administered 2017-07-05: 0.63 mg via RESPIRATORY_TRACT

## 2017-07-05 NOTE — Progress Notes (Signed)
HPI  Female never smoker followed for sarcoid, chronic bronchitis complicated by history of right breast cancer/ XRT, tachycardia, GERD, anxiety, glaucoma, tremor.[name]  PCP Dr Lucien Mons Med Cardiac Study 11/25/16- EF 77%, hyperdynamic, otw  WNL Office Spirometry 12/31/2016-mild restriction of exhaled volume. FVC 1.61/77%, FEV1 1.37/86%, ratio 0.85, FEF 25-75% 2.00/ 144% -----------------------------------------------------------------------------------------.  12/31/2016- 76 year old female never smoker followed for sarcoid, chronic bronchitis, accompanied by history right breast CA/XRT, Tachycardia, GERD, anxiety, glaucoma, tremor FOLLOWS FOR: Pt notes some increased SOB - feels that her allergies mihgt be contributing to her SOB and causing cough and wheezing. Cough is dry. Denies any chest tightness.  Eye doctor gave her Systane lubricant for burning and itching eyes. Her tremor makes use of this mechanically difficult. She does have Claritin which she has not been taking recently. We discussed onset of spring tree pollen season. She dislikes nasal sprays. Vague diffuse soreness through back and rib cage especially upper anterior chest. Some dry cough. Denies fever, adenopathy, rash. Office Spirometry 12/31/2016-mild restriction of exhaled volume. FVC 1.61/77%, FEV1 1.37/86%, ratio 0.85, FEF 25-75% 2.00/ 144%  07/05/17- 76 year old female never smoker followed for sarcoid, chronic bronchitis, accompanied by history right breast CA/XRT, Tachycardia, GERD, anxiety, glaucoma, tremor FOLLOWS FOR: Pt states she has had a productive cough-white in color/slight green at times with wheezing as well for about 2 weeks. Denies any fever or chills-pt notes she stays cold all the time. Does not feel acutely ill and denies blood, adenopathy, or other new systemic discomforts CXR 12/31/16- IMPRESSION: Chronic changes without acute abnormality.  ROS- see HPI  + = positive Constitutional:   No-   weight loss, night  sweats, fevers, chills,+ fatigue, lassitude. HEENT:   No-  headaches, difficulty swallowing, tooth/dental problems, sore throat,       No-  sneezing, itching, ear ache, nasal congestion, post nasal drip,  CV:   +chest pain, orthopnea, PND, swelling in lower extremities, anasarca, dizziness, palpitations Resp: No-   shortness of breath with exertion or at rest.              +productive cough,  + non-productive cough,  No-  coughing up of blood.              No-   change in color of mucus.  No- wheezing.   Skin: no-pruritus or rash  GI:  No-   heartburn, indigestion, abdominal pain, nausea, vomiting,  GU:. MS:  No-   joint pain or swelling.  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 No depression or anxiety.  No memory loss.  Obj General- Alert, Oriented, Affect-appropriate, Distress- none acute                    + Always slumped, almost kyphotic posture Skin- + hyperpigmentation right forearm attributed to shingles Lymphadenopathy- none Head- atraumatic            Eyes- Gross vision intact, PERRLA, conjunctivae clear secretions            Ears- Hearing, canals-normal            Nose- Clear, no-Septal dev, mucus, polyps, erosion, perforation             Throat- Mallampati II , mucosa clear , drainage- none, tonsils- atrophic. + Dentures Neck- flexible , trachea midline, no stridor , thyroid nl, carotid no bruit Chest - symmetrical excursion , unlabored  Heart/CV- RRR with skipped beats , no murmur , no gallop  , no rub, nl s1 s2                - JVD- none , edema- none, stasis changes- none, varices- none           Lung- + bilateral mild rhonchi ,  Work of breathing is not increased.   cough- none ,                           dullness-none, rub- none           Chest wall- +treatment Scar R breast, L upper anterior chest,  Abd- No HSM Br/ Gen/ Rectal- Not done, not indicated Extrem- cyanosis- none, clubbing, none, atrophy- none,  strength- nl Neuro- +Tremor .

## 2017-07-05 NOTE — Patient Instructions (Signed)
Neb xop 0.63      Dx    Chronic bronchitis exacerbation  Depo 80  Sample and print script Breo 100     Inhale 1 puff, once daily   If it helps and your cough stays under good control, you cand get there prescription filled.

## 2017-07-12 ENCOUNTER — Encounter: Payer: Self-pay | Admitting: Podiatry

## 2017-07-12 ENCOUNTER — Ambulatory Visit (INDEPENDENT_AMBULATORY_CARE_PROVIDER_SITE_OTHER): Payer: Medicare Other | Admitting: Podiatry

## 2017-07-12 VITALS — BP 163/89 | HR 94

## 2017-07-12 DIAGNOSIS — L84 Corns and callosities: Secondary | ICD-10-CM

## 2017-07-12 DIAGNOSIS — M79676 Pain in unspecified toe(s): Secondary | ICD-10-CM

## 2017-07-12 DIAGNOSIS — B351 Tinea unguium: Secondary | ICD-10-CM | POA: Diagnosis not present

## 2017-07-12 NOTE — Progress Notes (Signed)
Patient ID: Danielle Peters, female   DOB: May 28, 1941, 76 y.o.   MRN: [id number]   Subjective: This patient presents today complaining of painful toenails which are comfortable walking wearing shoes and painful callouses on the right and left feet  Objective: Orientated 3 DP and PT pulses 2/4 bilaterally Capillary reflex and normal limits bilaterally Sensation to 10 g monofilament wire intact 5/5 bilaterally Vibratory sensation reactive bilaterally Ankle reflexes reactive bilaterally No open skin lesions bilaterally Callouses distal hallux bilaterally and medial left heel Listers corn fifth left toe The toenails elongated, brittle, discolored, hypertrophic and tender trigger palpation Hammertoe third left Manual motor testing dorsi flexion, plantar flexion 5/5 bilaterally  Assessment: Symptomatic mycotic toenails 6-10 Multiple calluses and listers corn  Plan: Debridement toenails 6-10 mechanically and likely without any bleeding Debrided calluses 3 without any bleeding  Reappoint 3 months

## 2017-07-13 ENCOUNTER — Ambulatory Visit (INDEPENDENT_AMBULATORY_CARE_PROVIDER_SITE_OTHER): Payer: Medicare Other | Admitting: Physician Assistant

## 2017-07-13 ENCOUNTER — Encounter: Payer: Self-pay | Admitting: Physician Assistant

## 2017-07-13 VITALS — BP 140/80 | HR 80 | Ht 63.5 in | Wt 119.4 lb

## 2017-07-13 DIAGNOSIS — I447 Left bundle-branch block, unspecified: Secondary | ICD-10-CM | POA: Diagnosis not present

## 2017-07-13 DIAGNOSIS — R0789 Other chest pain: Secondary | ICD-10-CM

## 2017-07-13 DIAGNOSIS — R002 Palpitations: Secondary | ICD-10-CM

## 2017-07-13 DIAGNOSIS — I471 Supraventricular tachycardia: Secondary | ICD-10-CM

## 2017-07-13 NOTE — Progress Notes (Signed)
Cardiology Office Note   Date:  07/13/2017   ID:  Danielle Peters, DOB 01/17/1941, MRN [id number]  PCP:  Prince Solian, MD  Cardiologist:  Dr. Sallyanne Kuster, 12/01/2016  Rosaria Ferries, PA-C   Chief Complaint  Patient presents with  . Follow-up    left lower rib tremor    History of Present Illness: Danielle Peters is a 76 y.o. female with a history of RFA for AVNRT, HTN, intermittent left bundle branch block (rate-related), HLD intol statins, pulmonary sarcoidosis, essential tremor, history of left breast cancer with lumpectomy and chemotherapy, nl MV 11/25/2016, palpitations on nortriptyline (resolved with beta blocker)   12/01/2016 office visit, beta blocker changed to when necessary only, yearly follow-up recommended  Danielle Peters presents for cardiology follow up.  She saw Dr Annamaria Boots last week, was given a breathing treatment, a steroid shot, and an inhaler. She does not think the inhaler helps her breathing, just feels her heart race. She hears herself wheeze, feels that is coming from the sarcoid. She has also been coughing quite a bit, dry cough. No LE edema, no orthopnea or PND.   She has constant pain in her lower front L ribs. The area is tender to palpation.   Her BP is up today, it was higher yesterday, 160/80. That was because a woman at church upset her. She took her metoprolol yesterday, does not take it that often. Her BP has been 140 at times, but she also says it has been 120.  She has a tremor, does not feel the primidone is helping.    She wonders if she can exercise. She is afraid that her heart will race.    Past Medical History:  Diagnosis Date  . Anemia   . Anxiety   . Cancer ([place])    breast ca  . Chest pain    echo 01/08/10- nl lv; mild aortic sclerosis; myoview 01/08/10- no ischemia  . Dyslipidemia    myaligia with statins  . Esophageal reflux   . Hypertension   . Personal history of chemotherapy 2004  . Personal history of radiation  therapy 2004  . Pulmonary sarcoidosis ([place])   . Shingles 03/2016  . Tremor    upper extremities  . Ventricular tachycardia (paroxysmal) ([facility])    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; AV node reentry tachycardia ablation     Past Surgical History:  Procedure Laterality Date  . Boil Right 03/2015   on buttock, excision  . BREAST LUMPECTOMY Right 2004  . left breast lumpectomy,chemo  2004   xrt  . SKIN BIOPSY    . UPPER GI ENDOSCOPY  02/2015   difficulty swallowing, Abd pain    Current Outpatient Prescriptions  Medication Sig Dispense Refill  . acetaminophen (TYLENOL) 325 MG tablet Take 650 mg by mouth every 6 (six) hours as needed for mild pain.    [name] aspirin 81 MG tablet Take 81 mg by mouth every other day.     . diazepam (VALIUM) 2 MG tablet Take 2 mg by mouth every 6 (six) hours as needed for anxiety.    . fluticasone furoate-vilanterol (BREO ELLIPTA) 100-25 MCG/INH AEPB Inhale 1 puff into the lungs daily. 1 each 0  . metoprolol tartrate (LOPRESSOR) 25 MG tablet Take 12.5 mg by mouth as needed.     . neomycin-polymyxin-hydrocortisone (CORTISPORIN) OTIC solution Place 4 drops into both ears 4 (four) times daily.    [name] omeprazole (PRILOSEC) 40 MG capsule Take 40 mg by mouth daily.    [name]  ondansetron (ZOFRAN) 4 MG tablet Take 4 mg by mouth every 8 (eight) hours as needed for nausea.     . primidone (MYSOLINE) 50 MG tablet Take 1 tablet (50 mg total) by mouth 3 (three) times daily. 90 tablet 11  . traMADol (ULTRAM) 50 MG tablet Take 100 mg by mouth every 8 (eight) hours as needed. for pain  4  . valsartan-hydrochlorothiazide (DIOVAN-HCT) 80-12.5 MG per tablet Take 1 tablet by mouth daily.  4   No current facility-administered medications for this visit.     Allergies:   Dextromethorphan polistirex er; Primidone; Albuterol; Amoxicillin; Asa [aspirin]; Loratadine; Nortriptyline; and Simvastatin    Social History:  The patient  reports that she has never smoked. She has never used smokeless  tobacco. She reports that she does not drink alcohol or use drugs.   Family History:  The patient's family history includes Cancer in her mother; Heart attack in her sister; Heart failure (age of onset: 61) in her father.    ROS:  Please see the history of present illness. All other systems are reviewed and negative.    PHYSICAL EXAM: VS:  BP 140/80   Pulse 80   Ht 5' 3.5" (1.613 m)   Wt 119 lb 6.4 oz (54.2 kg)   BMI 20.82 kg/m  , BMI Body mass index is 20.82 kg/m. GEN: Well nourished, well developed, female in no acute distress  HEENT: normal for age  Neck: no JVD, no carotid bruit, no masses Cardiac: RRR; soft murmur, no rubs, or gallops Respiratory:  Scattered dry rales with minor wheezing bilaterally, normal work of breathing GI: soft, nontender, nondistended, + BS MS: no deformity or atrophy; no edema; distal pulses are 2+ in all 4 extremities   Skin: warm and dry, no rash Neuro:  Strength and sensation are intact Psych: euthymic mood, full affect   EKG:  EKG is ordered today. The ekg ordered today demonstrates SR, HR 80, LBBB is old   Recent Labs: 11/16/2016: BUN 15; Creat 0.93; Magnesium 2.2; Potassium 4.2; Sodium 139; TSH 0.99    Lipid Panel No results found for: CHOL, TRIG, HDL, CHOLHDL, VLDL, LDLCALC, LDLDIRECT   Wt Readings from Last 3 Encounters:  07/13/17 119 lb 6.4 oz (54.2 kg)  07/05/17 120 lb 12.8 oz (54.8 kg)  01/05/17 127 lb (57.6 kg)     Other studies Reviewed: Additional studies/ records that were reviewed today include: Office records, hospital records and testing.  ASSESSMENT AND PLAN:  1.  Chest pain: She has chest wall tenderness. I explained that there is no indication that this is coming from her heart. It is okay to take Tylenol for this. It is not related to her palpitations or fast heart rate.  2. Palpitations: She rarely takes the metoprolol. I feel her blood pressure is high enough to tolerate it, but she states that her blood pressure  runs normal to low on a regular basis and she is very reluctant to do this. Continue metoprolol as needed and monitor blood pressure. If an inhaler increases her blood pressure, that is normal and not dangerous to her. If she exercises, and her heart rate speeds up, that is appropriate as long as it stays below 120.   Current medicines are reviewed at length with the patient today.  The patient has concerns regarding medicines. Concerns were addressed  The following changes have been made:  no change  Labs/ tests ordered today include:  No orders of the defined types were placed  in this encounter.    Disposition:   FU with Dr. Sallyanne Kuster  Signed, Rosaria Ferries, PA-C  07/13/2017 2:14 PM    [place] Phone: [phone]; Fax: [phone]  This note was written with the assistance of speech recognition software. Please excuse any transcriptional errors.

## 2017-07-13 NOTE — Patient Instructions (Addendum)
Medication Instructions:  NO CHANGES If you need a refill on your cardiac medications before your next appointment, please call your pharmacy.  Follow-Up: Your physician wants you to follow-up in: WITH DR Sallyanne Kuster IN February 2019 AS PLANNED You should receive a reminder letter in the mail two months in advance. If you do not receive a letter, please call our office December 2018 to schedule the February 2019 follow-up appointment.  Special Instructions: PLEASE FOLLOW HEART HEALTHY DIET  OK TO TAKE METOPROLOL BEFORE YOU EXERCISE IF YOU FEEL YOU NEED TO  Thank you for choosing [facility] at [facility]     Heart-Healthy Eating Plan Heart-healthy meal planning includes:  Limiting unhealthy fats.  Increasing healthy fats.  Making other small dietary changes.  You may need to talk with your doctor or a diet specialist (dietitian) to create an eating plan that is right for you. What types of fat should I choose?  Choose healthy fats. These include olive oil and canola oil, flaxseeds, walnuts, almonds, and seeds.  Eat more omega-3 fats. These include salmon, mackerel, sardines, tuna, flaxseed oil, and ground flaxseeds. Try to eat fish at least twice each week.  Limit saturated fats. ? Saturated fats are often found in animal products, such as meats, butter, and cream. ? Plant sources of saturated fats include palm oil, palm kernel oil, and coconut oil.  Avoid foods with partially hydrogenated oils in them. These include stick margarine, some tub margarines, cookies, crackers, and other baked goods. These contain trans fats. What general guidelines do I need to follow?  Check food labels carefully. Identify foods with trans fats or high amounts of saturated fat.  Fill one half of your plate with vegetables and green salads. Eat 4-5 servings of vegetables per day. A serving of vegetables is: ? 1 cup of raw leafy vegetables. ?  cup of raw or cooked cut-up vegetables. ?  cup  of vegetable juice.  Fill one fourth of your plate with whole grains. Look for the word "whole" as the first word in the ingredient list.  Fill one fourth of your plate with lean protein foods.  Eat 4-5 servings of fruit per day. A serving of fruit is: ? One medium whole fruit. ?  cup of dried fruit. ?  cup of fresh, frozen, or canned fruit. ?  cup of 100% fruit juice.  Eat more foods that contain soluble fiber. These include apples, broccoli, carrots, beans, peas, and barley. Try to get 20-30 g of fiber per day.  Eat more home-cooked food. Eat less restaurant, buffet, and fast food.  Limit or avoid alcohol.  Limit foods high in starch and sugar.  Avoid fried foods.  Avoid frying your food. Try baking, boiling, grilling, or broiling it instead. You can also reduce fat by: ? Removing the skin from poultry. ? Removing all visible fats from meats. ? Skimming the fat off of stews, soups, and gravies before serving them. ? Steaming vegetables in water or broth.  Lose weight if you are overweight.  Eat 4-5 servings of nuts, legumes, and seeds per week: ? One serving of dried beans or legumes equals  cup after being cooked. ? One serving of nuts equals 1 ounces. ? One serving of seeds equals  ounce or one tablespoon.  You may need to keep track of how much salt or sodium you eat. This is especially true if you have high blood pressure. Talk with your doctor or dietitian to get more information. What foods  can I eat? Grains Breads, including [place], white, pita, wheat, raisin, rye, oatmeal, and [place]. Tortillas that are neither fried nor made with lard or trans fat. Low-fat rolls, including hotdog and hamburger buns and English muffins. Biscuits. Muffins. Waffles. Pancakes. Light popcorn. Whole-grain cereals. Flatbread. Melba toast. Pretzels. Breadsticks. Rusks. Low-fat snacks. Low-fat crackers, including oyster, saltine, matzo, graham, animal, and rye. Rice and pasta, including  brown rice and pastas that are made with whole wheat. Vegetables All vegetables. Fruits All fruits, but limit coconut. Meats and Other Protein Sources Lean, well-trimmed beef, veal, pork, and lamb. Chicken and [place] without skin. All fish and shellfish. Wild duck, rabbit, pheasant, and venison. Egg whites or low-cholesterol egg substitutes. Dried beans, peas, lentils, and tofu. Seeds and most nuts. Dairy Low-fat or nonfat cheeses, including ricotta, string, and mozzarella. Skim or 1% milk that is liquid, powdered, or evaporated. Buttermilk that is made with low-fat milk. Nonfat or low-fat yogurt. Beverages Mineral water. Diet carbonated beverages. Sweets and Desserts Sherbets and fruit ices. Honey, jam, marmalade, jelly, and syrups. Meringues and gelatins. Pure sugar candy, such as hard candy, jelly beans, gumdrops, mints, marshmallows, and small amounts of dark chocolate. [employer] Eat all sweets and desserts in moderation. Fats and Oils Nonhydrogenated (trans-free) margarines. Vegetable oils, including soybean, sesame, sunflower, olive, peanut, safflower, corn, canola, and cottonseed. Salad dressings or mayonnaise made with a vegetable oil. Limit added fats and oils that you use for cooking, baking, salads, and as spreads. Other Cocoa powder. Coffee and tea. All seasonings and condiments. The items listed above may not be a complete list of recommended foods or beverages. Contact your dietitian for more options. What foods are not recommended? Grains Breads that are made with saturated or trans fats, oils, or whole milk. Croissants. Butter rolls. Cheese breads. Sweet rolls. Donuts. Buttered popcorn. Chow mein noodles. High-fat crackers, such as cheese or butter crackers. Meats and Other Protein Sources Fatty meats, such as hotdogs, short ribs, sausage, spareribs, bacon, rib eye roast or steak, and mutton. High-fat deli meats, such as salami and bologna. Caviar. Domestic duck and  goose. Organ meats, such as kidney, liver, sweetbreads, and heart. Dairy Cream, sour cream, cream cheese, and creamed cottage cheese. Whole-milk cheeses, including blue (bleu), Monterey Jack, [place], [place], American, [place], Swiss, cheddar, [place], and [place]. Whole or 2% milk that is liquid, evaporated, or condensed. Whole buttermilk. Cream sauce or high-fat cheese sauce. Yogurt that is made from whole milk. Beverages Regular sodas and juice drinks with added sugar. Sweets and Desserts Frosting. Pudding. Cookies. Cakes other than angel food cake. Candy that has milk chocolate or white chocolate, hydrogenated fat, butter, coconut, or unknown ingredients. Buttered syrups. Full-fat ice cream or ice cream drinks. Fats and Oils Gravy that has suet, meat fat, or shortening. Cocoa butter, hydrogenated oils, palm oil, coconut oil, palm kernel oil. These can often be found in baked products, candy, fried foods, nondairy creamers, and whipped toppings. Solid fats and shortenings, including bacon fat, salt pork, lard, and butter. Nondairy cream substitutes, such as coffee creamers and sour cream substitutes. Salad dressings that are made of unknown oils, cheese, or sour cream. The items listed above may not be a complete list of foods and beverages to avoid. Contact your dietitian for more information. This information is not intended to replace advice given to you by your health care provider. Make sure you discuss any questions you have with your health care provider. Document Released: 04/05/2012 Document Revised: 03/12/2016 Document Reviewed: 03/29/2014 Elsevier  Interactive Patient Education  [employer].

## 2017-07-20 ENCOUNTER — Other Ambulatory Visit: Payer: Self-pay | Admitting: Internal Medicine

## 2017-07-20 ENCOUNTER — Ambulatory Visit
Admission: RE | Admit: 2017-07-20 | Discharge: 2017-07-20 | Disposition: A | Discharge status: Home / Self Care | Payer: Self-pay | Source: Ambulatory Visit | Attending: Internal Medicine | Admitting: Internal Medicine

## 2017-07-20 DIAGNOSIS — D869 Sarcoidosis, unspecified: Secondary | ICD-10-CM

## 2017-07-22 ENCOUNTER — Encounter: Payer: Self-pay | Admitting: Podiatry

## 2017-07-22 ENCOUNTER — Ambulatory Visit (INDEPENDENT_AMBULATORY_CARE_PROVIDER_SITE_OTHER): Payer: Medicare Other | Admitting: Podiatry

## 2017-07-22 VITALS — BP 117/71 | HR 82 | Resp 16

## 2017-07-22 DIAGNOSIS — L84 Corns and callosities: Secondary | ICD-10-CM

## 2017-07-22 DIAGNOSIS — D169 Benign neoplasm of bone and articular cartilage, unspecified: Secondary | ICD-10-CM

## 2017-07-22 NOTE — Progress Notes (Signed)
Subjective:    Patient ID: Danielle Peters, female   DOB: 76 y.o.   MRN: [id number]   HPI patient states still having pain with the fifth toe of my left foot and I think I probably have a bone spur    ROS      Objective:  Physical Exam neurovascular status intact with patient found to have a keratotic lesion fifth digit left that's very painful when pressed with exostotic-like appearance     Assessment:   Probable exostosis fifth digit left with keratotic painful lesion formation      Plan:   H&P reviewed condition and went ahead and anesthetize digit. Today using sterile instrumentation I did debridement of the lesion fully and I discussed a exostectomy with removal of lesion. Patient may have this done depending on the response to this treatment and I did today

## 2017-08-08 NOTE — Assessment & Plan Note (Signed)
Mild subacute exacerbation Plan-nebulizer treatments Xopenex, Depo-Medrol, sample trial Breo Ellipta

## 2017-08-08 NOTE — Assessment & Plan Note (Signed)
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

## 2017-08-08 NOTE — Assessment & Plan Note (Signed)
Rhythm is controlled at this visit with occasional skipped beat. She does not report recent palpitations.

## 2017-08-16 ENCOUNTER — Encounter (INDEPENDENT_AMBULATORY_CARE_PROVIDER_SITE_OTHER): Payer: Self-pay | Admitting: Orthopedic Surgery

## 2017-08-16 ENCOUNTER — Ambulatory Visit (INDEPENDENT_AMBULATORY_CARE_PROVIDER_SITE_OTHER): Payer: Medicare Other | Admitting: Orthopedic Surgery

## 2017-08-16 DIAGNOSIS — M6702 Short Achilles tendon (acquired), left ankle: Secondary | ICD-10-CM

## 2017-08-16 DIAGNOSIS — M25572 Pain in left ankle and joints of left foot: Secondary | ICD-10-CM

## 2017-08-16 NOTE — Progress Notes (Signed)
Office Visit Note   Patient: Danielle Peters           Date of Birth: 1940/11/04           MRN: [id number] Visit Date: 08/16/2017              Requested by: Prince Solian, MD [address] [place], [place] [address] PCP: Prince Solian, MD  Chief Complaint  Patient presents with  . Left Ankle - Pain      HPI: Patient is a 76 year old woman who presents for initial evaluation for insertional left Achilles pain as well as pain over the lateral aspect of the left forefoot.  She states she has had symptoms for several months she has been seen by Dr. Felisa Bonier she states she was diagnosed with Achilles tendinitis and she states she has had a cortisone shot.  Assessment & Plan: Visit Diagnoses:  1. Pain in left ankle and joints of left foot   2. Achilles tendon contracture, left     Plan: Patient was given instructions and demonstrated heel cord stretching to be 5 times a day minute at a time the callus was pared on the left heel without complications.  Follow-Up Instructions: Return in about 3 months (around 11/16/2017).   Ortho Exam  Patient is alert, oriented, no adenopathy, well-dressed, normal affect, normal respiratory effort. On examination patient has a good dorsalis pedis pulse.  She has significant heel cord contracture with dorsiflexion about 10 degrees short of neutral with her knee extended.  She has tenderness to palpation of the insertion of the Achilles with insertional Achilles tendinitis due to her contracture.  There are no nodular changes to the Achilles no palpable defects.  Examination she does have fixed clawing of her toes she has a corn over the little toe and a large callus on the left heel.  After informed consent 10 blade the knife was used to pare the callus without complications.  This area was 2 cm in diameter 1 mm deep.  Patient's radiographs are reviewed which shows long second third and fourth metatarsals with fixed clawing of the toes and joint space  narrowing at the MTP joint of the great toe.  Imaging: No results found. No images are attached to the encounter.  Labs: Lab Results  Component Value Date   REPTSTATUS 08/04/2010 FINAL 08/02/2010   CULT  08/02/2010    Multiple bacterial morphotypes present, none predominant. Suggest appropriate recollection if clinically indicated.    Orders:  No orders of the defined types were placed in this encounter.  No orders of the defined types were placed in this encounter.    Procedures: No procedures performed  Clinical Data: No additional findings.  ROS:  All other systems negative, except as noted in the HPI. Review of Systems  Objective: Vital Signs: There were no vitals taken for this visit.  Specialty Comments:  No specialty comments available.  PMFS History: Patient Active Problem List   Diagnosis Date Noted  . Seasonal and perennial allergic rhinitis 12/31/2016  . Hypercholesterolemia 12/01/2016  . Tremor 10/07/2016  . Left lumbar radiculopathy 10/07/2016  . 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 LBBB (left bundle branch block) 06/09/2014  . Paresthesia 03/30/2014  . Chest pain, atypical 12/09/2013  . Itching 06/28/2013  . Right leg pain 03/02/2013  . Essential hypertension 03/02/2013  . GLAUCOMA 02/27/2010  . Essential tremor 08/30/2009  . Bronchitis, chronic obstructive, with exacerbation ([place]) 07/30/2008  . PULMONARY SARCOIDOSIS 11/04/2007  .  Malignant neoplasm of upper outer quadrant of female breast ([place]) 11/04/2007  . ANEMIA 11/04/2007  . ANXIETY 11/04/2007  . Paroxysmal supraventricular tachycardia ([place]) 11/04/2007  . ESOPHAGEAL REFLUX 11/04/2007   Past Medical History:  Diagnosis Date  . Anemia   . Anxiety   . Cancer ([place])    breast ca  . Chest pain    echo 01/08/10- nl lv; mild aortic sclerosis; myoview 01/08/10- no ischemia  . Dyslipidemia    myaligia with statins  . Esophageal reflux   . Hypertension     . Personal history of chemotherapy 2004  . Personal history of radiation therapy 2004  . Pulmonary sarcoidosis ([place])   . Shingles 03/2016  . Tremor    upper extremities  . Ventricular tachycardia (paroxysmal) ([facility])    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; AV node reentry tachycardia ablation     Family History  Problem Relation Age of Onset  . Heart failure Father 65  . Cancer Mother   . Heart attack Sister        2 heart attacks  . Breast cancer Neg Hx     Past Surgical History:  Procedure Laterality Date  . Boil Right 03/2015   on buttock, excision  . BREAST LUMPECTOMY Right 2004  . left breast lumpectomy,chemo  2004   xrt  . SKIN BIOPSY    . 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 Smoking status: Never Smoker  . Smokeless tobacco: Never Used  . Alcohol use No  . Drug use: No  . Sexual activity: Not on file

## 2017-10-04 ENCOUNTER — Ambulatory Visit: Payer: Medicare Other | Admitting: Podiatry

## 2017-10-04 ENCOUNTER — Encounter: Payer: Self-pay | Admitting: Podiatry

## 2017-10-04 DIAGNOSIS — M79676 Pain in unspecified toe(s): Secondary | ICD-10-CM | POA: Diagnosis not present

## 2017-10-04 DIAGNOSIS — L84 Corns and callosities: Secondary | ICD-10-CM

## 2017-10-04 DIAGNOSIS — B351 Tinea unguium: Secondary | ICD-10-CM | POA: Diagnosis not present

## 2017-10-04 NOTE — Progress Notes (Signed)
Subjective: 76 y.o. returns the office today for painful, elongated, thickened toenails which she cannot trim herself. Denies any redness or drainage around the nails.  She also gets a painful corn the left fifth toe.  She states that she is not comfortable in any shoe.  Denies any acute changes since last appointment and no new complaints today. Denies any systemic complaints such as fevers, chills, nausea, vomiting.   PCP: Prince Solian, MD  Objective: AAO 3, NAD DP/PT pulses palpable, CRT less than 3 seconds Nails hypertrophic, dystrophic, elongated, brittle, discolored 10. There is tenderness overlying the nails 1-5 bilaterally. There is no surrounding erythema or drainage along the nail sites. Hyperkeratotic lesion left dorsal lateral fifth toe at the PIPJ.  There is no underlying ulceration, drainage or any signs of infection present. No open lesions or pre-ulcerative lesions are identified. No other areas of tenderness bilateral lower extremities. No overlying edema, erythema, increased warmth. No pain with calf compression, swelling, warmth, erythema.  Assessment: Patient presents with symptomatic onychomycosis; hyperkeratotic lesion  Plan: -Treatment options including alternatives, risks, complications were discussed -Nails sharply debrided 10 without complication/bleeding. -Hyperkeratotic lesion was sharply debrided x1 without any complications or bleeding.  Offloading pads were dispensed.  Given this is been ongoing for some time consider surgical intervention for this. -Discussed daily foot inspection. If there are any changes, to call the office immediately.  -Follow-up in 3 months or sooner if any problems are to arise. In the meantime, encouraged to call the office with any questions, concerns, changes symptoms.  Celesta Gentile, DPM

## 2017-10-27 ENCOUNTER — Telehealth: Payer: Self-pay | Admitting: Internal Medicine

## 2017-10-27 MED ORDER — DOXYCYCLINE HYCLATE 100 MG PO TABS: ORAL_TABLET | ORAL | 0 refills | Status: DC

## 2017-10-27 NOTE — Telephone Encounter (Signed)
Spoke with pt. States that she is not feeling well. Reports cough, sinus congestion and wheezing. Cough is producing white mucus. Denies chest tightness, SOB, fever or body aches. Symptoms started 1 week ago. Pt would like CY's recommendations.  CY - please advise. Thanks.  Allergies  Allergen Reactions  . Dextromethorphan Polistirex Er Other (See Comments)    SOB  . Primidone     Severe Headaches  . Albuterol     REACTION: tachycardia  . Amoxicillin Diarrhea and Nausea Only  . Asa [Aspirin]     Adult dose  . Loratadine   . Nortriptyline     Increase heart rate.  . Simvastatin     Myalgias    Current Outpatient Medications on File Prior to Visit  Medication Sig Dispense Refill  . acetaminophen (TYLENOL) 325 MG tablet Take 650 mg by mouth every 6 (six) hours as needed for mild pain.    [name] aspirin 81 MG tablet Take 81 mg by mouth every other day.     . diazepam (VALIUM) 2 MG tablet Take 2 mg by mouth every 6 (six) hours as needed for anxiety.    . fluticasone (FLONASE) 50 MCG/ACT nasal spray SPRAY 2 SPRAYS INTO EACH NOSTRIL EVERY DAY  11  . fluticasone furoate-vilanterol (BREO ELLIPTA) 100-25 MCG/INH AEPB Inhale 1 puff into the lungs daily. 1 each 0  . metoprolol tartrate (LOPRESSOR) 25 MG tablet Take 12.5 mg by mouth as needed.     . neomycin-polymyxin-hydrocortisone (CORTISPORIN) OTIC solution Place 4 drops into both ears 4 (four) times daily.    [name] omeprazole (PRILOSEC) 40 MG capsule Take 40 mg by mouth daily.    . ondansetron (ZOFRAN) 4 MG tablet Take 4 mg by mouth every 8 (eight) hours as needed for nausea.     . primidone (MYSOLINE) 50 MG tablet Take 1 tablet (50 mg total) by mouth 3 (three) times daily. 90 tablet 11  . traMADol (ULTRAM) 50 MG tablet Take 100 mg by mouth every 8 (eight) hours as needed. for pain  4  . valsartan-hydrochlorothiazide (DIOVAN-HCT) 80-12.5 MG per tablet Take 1 tablet by mouth daily.  4   No current facility-administered medications on file  prior to visit.

## 2017-10-27 NOTE — Telephone Encounter (Signed)
Spoke with patient. She is aware of CY's recs. She wanted to know if it was safe for her to use the Breo inhaler and take the doxy at the same time. Advised her that this would be ok. She verbalized understanding.   Doxy has been called into [facility] on [address].   Nothing else needed at time of call.

## 2017-10-27 NOTE — Telephone Encounter (Signed)
Offer doxycycline 100 mg, # 8, 2 today then one daily 

## 2017-11-04 ENCOUNTER — Ambulatory Visit (INDEPENDENT_AMBULATORY_CARE_PROVIDER_SITE_OTHER): Payer: Medicare Other

## 2017-11-04 ENCOUNTER — Encounter (INDEPENDENT_AMBULATORY_CARE_PROVIDER_SITE_OTHER): Payer: Self-pay | Admitting: Orthopedic Surgery

## 2017-11-04 ENCOUNTER — Ambulatory Visit (INDEPENDENT_AMBULATORY_CARE_PROVIDER_SITE_OTHER): Payer: Medicare Other | Admitting: Orthopedic Surgery

## 2017-11-04 VITALS — Ht 63.0 in | Wt 119.0 lb

## 2017-11-04 DIAGNOSIS — M6702 Short Achilles tendon (acquired), left ankle: Secondary | ICD-10-CM | POA: Diagnosis not present

## 2017-11-04 DIAGNOSIS — L97421 Non-pressure chronic ulcer of left heel and midfoot limited to breakdown of skin: Secondary | ICD-10-CM | POA: Diagnosis not present

## 2017-11-04 DIAGNOSIS — M79671 Pain in right foot: Secondary | ICD-10-CM

## 2017-11-04 DIAGNOSIS — M6701 Short Achilles tendon (acquired), right ankle: Secondary | ICD-10-CM

## 2017-11-04 DIAGNOSIS — M79672 Pain in left foot: Secondary | ICD-10-CM | POA: Diagnosis not present

## 2017-11-04 NOTE — Progress Notes (Signed)
Office Visit Note   Patient: Danielle Peters           Date of Birth: 07-01-1941           MRN: [id number] Visit Date: 11/04/2017              Requested by: Prince Solian, MD [address] [place], [place] [address] PCP: Prince Solian, MD  Chief Complaint  Patient presents with  . Left Foot - Follow-up      HPI: Patient is a 77 year old woman who presents in follow-up for painful Achilles insertional tendinitis bilaterally.  Patient previously had been given instructions for heel cord stretching patient also complains of an ulcer over the left heel and ulcer over the left little toe secondary to her heel cord tightness.  Assessment & Plan: Visit Diagnoses:  1. Pain in right foot   2. Pain in left foot   3. Achilles tendon contracture, bilateral   4. Ulcer of left heel, limited to breakdown of skin ([place])     Plan: Patient was given instruction and demonstrated heel cord stretching to do 5 times a day a minute at a time.  Ulcers were debrided x2 on the left foot.  Reevaluation in 4 weeks.  She has good supportive sneakers.  Follow-Up Instructions: Return in about 4 weeks (around 12/02/2017).   Ortho Exam  Patient is alert, oriented, no adenopathy, well-dressed, normal affect, normal respiratory effort. Examination patient has a good dorsalis pedis pulse bilaterally.  She has significant heel cord contracture with dorsiflexion about 10 degrees short of neutral with her knee extended.  She has an ulcer over the posterior medial aspect of the left heel.  After informed consent a 10 blade knife was used to debride the skin and soft tissue back to healthy viable tissue the ulcer is 2 cm in diameter.  Patient also has a hypertrophic callus over the left little toe laterally secondary to her heel cord contracture.  The nail was trimmed the callus was pared there is no signs of infection there is no swelling no cellulitis.  Patient is tender to palpation over the insertion of the  Achilles bilaterally with a Haglund's deformity.  Imaging: Xr Foot 2 Views Left  Result Date: 11/04/2017 2 view radiographs of the left foot shows a Haglund's deformity of the calcaneus.  No bony abnormalities of the forefoot.  No signs of infection.  Xr Foot 2 Views Right  Result Date: 11/04/2017 2 view radiographs of the right foot shows a Haglund's deformity of the calcaneus no bony abnormalities of the forefoot or midfoot.  No images are attached to the encounter.  Labs: Lab Results  Component Value Date   REPTSTATUS 08/04/2010 FINAL 08/02/2010   CULT  08/02/2010    Multiple bacterial morphotypes present, none predominant. Suggest appropriate recollection if clinically indicated.    @LABSALLVALUES (HGBA1)@  Body mass index is 21.08 kg/m.  Orders:  Orders Placed This Encounter  Procedures  . XR Foot 2 Views Right  . XR Foot 2 Views Left   No orders of the defined types were placed in this encounter.    Procedures: No procedures performed  Clinical Data: No additional findings.  ROS:  All other systems negative, except as noted in the HPI. Review of Systems  Objective: Vital Signs: Ht 5\' 3"  (1.6 m)   Wt 119 lb (54 kg)   BMI 21.08 kg/m   Specialty Comments:  No specialty comments available.  PMFS History: Patient Active Problem List  Diagnosis Date Noted  . 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 Ulcer of left heel, limited to breakdown of skin ([place]) 11/04/2017  . Seasonal and perennial allergic rhinitis 12/31/2016  . Hypercholesterolemia 12/01/2016  . Tremor 10/07/2016  . Left lumbar radiculopathy 10/07/2016  . 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 LBBB (left bundle branch block) 06/09/2014  . Paresthesia 03/30/2014  . Chest pain, atypical 12/09/2013  . Itching 06/28/2013  . Right leg pain 03/02/2013  . Essential hypertension 03/02/2013  . GLAUCOMA 02/27/2010  . Essential tremor 08/30/2009  .  Bronchitis, chronic obstructive, with exacerbation ([place]) 07/30/2008  . PULMONARY SARCOIDOSIS 11/04/2007  . Malignant neoplasm of upper outer quadrant of female breast ([place]) 11/04/2007  . ANEMIA 11/04/2007  . ANXIETY 11/04/2007  . Paroxysmal supraventricular tachycardia ([place]) 11/04/2007  . ESOPHAGEAL REFLUX 11/04/2007   Past Medical History:  Diagnosis Date  . Anemia   . Anxiety   . Cancer ([place])    breast ca  . Chest pain    echo 01/08/10- nl lv; mild aortic sclerosis; myoview 01/08/10- no ischemia  . Dyslipidemia    myaligia with statins  . Esophageal reflux   . Hypertension   . Personal history of chemotherapy 2004  . Personal history of radiation therapy 2004  . Pulmonary sarcoidosis ([place])   . Shingles 03/2016  . Tremor    upper extremities  . Ventricular tachycardia (paroxysmal) ([facility])    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; AV node reentry tachycardia ablation     Family History  Problem Relation Age of Onset  . Heart failure Father 73  . Cancer Mother   . Heart attack Sister        2 heart attacks  . Breast cancer Neg Hx     Past Surgical History:  Procedure Laterality Date  . Boil Right 03/2015   on buttock, excision  . BREAST LUMPECTOMY Right 2004  . left breast lumpectomy,chemo  2004   xrt  . SKIN BIOPSY    . UPPER GI ENDOSCOPY  02/2015   difficulty swallowing, Abd pain   Social History   Occupational History  . 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 Smoking status: Never Smoker  . Smokeless tobacco: Never Used  Substance and Sexual Activity  . Alcohol use: No  . Drug use: No  . Sexual activity: Not on file

## 2017-11-16 ENCOUNTER — Ambulatory Visit (INDEPENDENT_AMBULATORY_CARE_PROVIDER_SITE_OTHER): Payer: Medicare Other | Admitting: Orthopaedic Surgery

## 2017-11-23 ENCOUNTER — Other Ambulatory Visit: Payer: Self-pay | Admitting: Gastroenterology

## 2017-11-23 DIAGNOSIS — R1310 Dysphagia, unspecified: Secondary | ICD-10-CM

## 2017-11-24 ENCOUNTER — Telehealth: Payer: Self-pay | Admitting: Internal Medicine

## 2017-11-24 MED ORDER — PREDNISONE 20 MG PO TABS: 20.0000 mg | ORAL_TABLET | Freq: Every day | ORAL | 0 refills | Status: DC

## 2017-11-24 MED ORDER — BENZONATATE 200 MG PO CAPS: 200.0000 mg | ORAL_CAPSULE | Freq: Three times a day (TID) | ORAL | 0 refills | Status: DC | PRN

## 2017-11-24 NOTE — Telephone Encounter (Signed)
Offer prednisone 20 mg, # 5    1 daily  Offer benzonatate 200 mg, # 30, 1 every 8 hours if needed for cough

## 2017-11-24 NOTE — Telephone Encounter (Signed)
CY please advise, patient was seen by PCP last Wednesday 1.30.19 for cough and congestion. She was given zofran an mucinex. Patient is still coughing up green mucous and has some congestion. She states that nothing is helping.   Please advise on what we can do for patient. ;ast OV with CY was 9.17.18  Current Outpatient Medications on File Prior to Visit  Medication Sig Dispense Refill  . acetaminophen (TYLENOL) 325 MG tablet Take 650 mg by mouth every 6 (six) hours as needed for mild pain.    [name] aspirin 81 MG tablet Take 81 mg by mouth every other day.     . diazepam (VALIUM) 2 MG tablet Take 2 mg by mouth every 6 (six) hours as needed for anxiety.    [name] doxycycline (VIBRA-TABS) 100 MG tablet Take 2 tablets today, then 1 tablet daily until finished. 8 tablet 0  . fluticasone (FLONASE) 50 MCG/ACT nasal spray SPRAY 2 SPRAYS INTO EACH NOSTRIL EVERY DAY  11  . fluticasone furoate-vilanterol (BREO ELLIPTA) 100-25 MCG/INH AEPB Inhale 1 puff into the lungs daily. 1 each 0  . metoprolol tartrate (LOPRESSOR) 25 MG tablet Take 12.5 mg by mouth as needed.     . neomycin-polymyxin-hydrocortisone (CORTISPORIN) OTIC solution Place 4 drops into both ears 4 (four) times daily.    [name] omeprazole (PRILOSEC) 40 MG capsule Take 40 mg by mouth daily.    . ondansetron (ZOFRAN) 4 MG tablet Take 4 mg by mouth every 8 (eight) hours as needed for nausea.     . primidone (MYSOLINE) 50 MG tablet Take 1 tablet (50 mg total) by mouth 3 (three) times daily. 90 tablet 11  . traMADol (ULTRAM) 50 MG tablet Take 100 mg by mouth every 8 (eight) hours as needed. for pain  4  . valsartan-hydrochlorothiazide (DIOVAN-HCT) 80-12.5 MG per tablet Take 1 tablet by mouth daily.  4   No current facility-administered medications on file prior to visit.    Allergies  Allergen Reactions  . Dextromethorphan Polistirex Er Other (See Comments)    SOB  . Primidone     Severe Headaches  . Albuterol     REACTION: tachycardia  . Amoxicillin  Diarrhea and Nausea Only  . Asa [Aspirin]     Adult dose  . Loratadine   . Nortriptyline     Increase heart rate.  . Simvastatin     Myalgias

## 2017-11-24 NOTE — Telephone Encounter (Signed)
Called and spoke with pt letting her know we were sending two Rx's to her pharmacy.  Pt expressed understanding.  Verified pt's preferred pharmacy and scripts sent in.  Nothing further needed at this current time.

## 2017-11-25 ENCOUNTER — Ambulatory Visit
Admission: RE | Admit: 2017-11-25 | Discharge: 2017-11-25 | Disposition: A | Discharge status: Home / Self Care | Payer: Medicare Other | Source: Ambulatory Visit | Attending: Gastroenterology | Admitting: Gastroenterology

## 2017-11-25 DIAGNOSIS — R1310 Dysphagia, unspecified: Secondary | ICD-10-CM

## 2017-12-02 ENCOUNTER — Ambulatory Visit (INDEPENDENT_AMBULATORY_CARE_PROVIDER_SITE_OTHER): Payer: Medicare Other | Admitting: Orthopedic Surgery

## 2017-12-14 ENCOUNTER — Telehealth: Payer: Self-pay | Admitting: Internal Medicine

## 2017-12-14 MED ORDER — HYDROCODONE-HOMATROPINE 5-1.5 MG/5ML PO SYRP: 5.0000 mL | ORAL_SOLUTION | Freq: Four times a day (QID) | ORAL | 0 refills | Status: DC | PRN

## 2017-12-14 NOTE — Telephone Encounter (Signed)
Ok to refill that [employer]. Please change volume to 140 ml.

## 2017-12-14 NOTE — Telephone Encounter (Signed)
Rx printed, awaiting signature. 

## 2017-12-14 NOTE — Telephone Encounter (Signed)
If they didn't do chest xray and draw labs, I would like hyer to come for outpatient CXR    Dx Sarcoid, question pneumonia.     Also lab for CBC w diff.

## 2017-12-14 NOTE — Telephone Encounter (Signed)
Spoke with patient. She is aware of the letting the levaquin work. She stated that she was given a hydrocodone cough syrup when she had similar symptoms that seemed to help with the cough. She does not have any cough syrup now but would like to receive a RX if possible.   Per her chart, she received Hycodan on 08/20/2016. Instructions were to take 11mLs every 6 hours prn for cough with a dispense quantity of 120 mL.

## 2017-12-14 NOTE — Telephone Encounter (Signed)
Spoke with pt, advised Rx signed and ready to pick up.

## 2017-12-14 NOTE — Telephone Encounter (Signed)
Called pt who stated she went to her PCP last night and saw a NP and per the NP, pt had pna.  Pt was prescribed levofloxacin and was told to use Breo.  Pt stated she also took the cough med that was prescribed by Dr. Annamaria Boots but stated it made her cough worse instead of helping.  From all the coughing, pt is having chest discomfort.  Dr. Annamaria Boots, pt is wanting some advice from you to see if anything different might need to be done for pt based on what the NP did for pt.  Allergies  Allergen Reactions  . Dextromethorphan Polistirex Er Other (See Comments)    SOB  . Primidone     Severe Headaches  . Albuterol     REACTION: tachycardia  . Amoxicillin Diarrhea and Nausea Only  . Asa [Aspirin]     Adult dose  . Loratadine   . Nortriptyline     Increase heart rate.  . Simvastatin     Myalgias      Current Outpatient Medications:  .  acetaminophen (TYLENOL) 325 MG tablet, Take 650 mg by mouth every 6 (six) hours as needed for mild pain., Disp: , Rfl:  .  aspirin 81 MG tablet, Take 81 mg by mouth every other day. , Disp: , Rfl:  .  benzonatate (TESSALON) 200 MG capsule, Take 1 capsule (200 mg total) by mouth every 8 (eight) hours as needed for cough., Disp: 30 capsule, Rfl: 0 .  diazepam (VALIUM) 2 MG tablet, Take 2 mg by mouth every 6 (six) hours as needed for anxiety., Disp: , Rfl:  .  doxycycline (VIBRA-TABS) 100 MG tablet, Take 2 tablets today, then 1 tablet daily until finished., Disp: 8 tablet, Rfl: 0 .  fluticasone (FLONASE) 50 MCG/ACT nasal spray, SPRAY 2 SPRAYS INTO EACH NOSTRIL EVERY DAY, Disp: , Rfl: 11 .  fluticasone furoate-vilanterol (BREO ELLIPTA) 100-25 MCG/INH AEPB, Inhale 1 puff into the lungs daily., Disp: 1 each, Rfl: 0 .  metoprolol tartrate (LOPRESSOR) 25 MG tablet, Take 12.5 mg by mouth as needed. , Disp: , Rfl:  .  neomycin-polymyxin-hydrocortisone (CORTISPORIN) OTIC solution, Place 4 drops into both ears 4 (four) times daily., Disp: , Rfl:  .  omeprazole  (PRILOSEC) 40 MG capsule, Take 40 mg by mouth daily., Disp: , Rfl:  .  ondansetron (ZOFRAN) 4 MG tablet, Take 4 mg by mouth every 8 (eight) hours as needed for nausea. , Disp: , Rfl:  .  predniSONE (DELTASONE) 20 MG tablet, Take 1 tablet (20 mg total) by mouth daily with breakfast., Disp: 5 tablet, Rfl: 0 .  primidone (MYSOLINE) 50 MG tablet, Take 1 tablet (50 mg total) by mouth 3 (three) times daily., Disp: 90 tablet, Rfl: 11 .  traMADol (ULTRAM) 50 MG tablet, Take 100 mg by mouth every 8 (eight) hours as needed. for pain, Disp: , Rfl: 4 .  valsartan-hydrochlorothiazide (DIOVAN-HCT) 80-12.5 MG per tablet, Take 1 tablet by mouth daily., Disp: , Rfl: 4

## 2017-12-14 NOTE — Telephone Encounter (Signed)
It will take a few das for the levaquin to work.  Does she know of a cough medicine that does work well for her? A heating pad may help her chest feel better.

## 2017-12-14 NOTE — Telephone Encounter (Signed)
Spoke with patient. She stated that the NP she saw yesterday did order a CXR and it showed a spot of PNA in her right lung. She stated that they did not do any blood work.

## 2018-01-03 ENCOUNTER — Ambulatory Visit: Payer: Medicare Other | Admitting: Podiatry

## 2018-01-03 ENCOUNTER — Ambulatory Visit: Payer: Medicare Other | Admitting: Internal Medicine

## 2018-01-03 ENCOUNTER — Encounter: Payer: Self-pay | Admitting: Internal Medicine

## 2018-01-03 VITALS — BP 130/62 | HR 90 | Ht 63.0 in | Wt 127.4 lb

## 2018-01-03 DIAGNOSIS — J441 Chronic obstructive pulmonary disease with (acute) exacerbation: Secondary | ICD-10-CM

## 2018-01-03 DIAGNOSIS — D869 Sarcoidosis, unspecified: Secondary | ICD-10-CM | POA: Diagnosis not present

## 2018-01-03 DIAGNOSIS — K219 Gastro-esophageal reflux disease without esophagitis: Secondary | ICD-10-CM | POA: Diagnosis not present

## 2018-01-03 NOTE — Progress Notes (Signed)
HPI  Female never smoker followed for sarcoid, chronic bronchitis complicated by history of right breast cancer/ XRT, tachycardia, GERD, anxiety, glaucoma, tremor.[name]  PCP Dr Lucien Mons Med Cardiac Study 11/25/16- EF 77%, hyperdynamic, otw  WNL Office Spirometry 12/31/2016-mild restriction of exhaled volume. FVC 1.61/77%, FEV1 1.37/86%, ratio 0.85, FEF 25-75% 2.00/ 144% -----------------------------------------------------------------------------------------.  07/05/17- 77 year old female never smoker followed for sarcoid, chronic bronchitis, accompanied by history right breast CA/XRT, Tachycardia, GERD, anxiety, glaucoma, tremor FOLLOWS FOR: Pt states she has had a productive cough-white in color/slight green at times with wheezing as well for about 2 weeks. Denies any fever or chills-pt notes she stays cold all the time. Does not feel acutely ill and denies blood, adenopathy, or other new systemic discomforts CXR 12/31/16- IMPRESSION: Chronic changes without acute abnormality.  01/03/2018- 77 year old female never smoker followed for sarcoid, chronic bronchitis, accompanied by history right breast CA/XRT, Tachycardia, GERD, anxiety, Glaucoma, tremor Has had a sustained bronchitis/cough this winter.  Evaluated at her PCP office and brings CXR disc and report which I have compared with prior imaging.  This looks like stable scarring behind right breast in the area of previous radiation therapy and sarcoid scarring.  Cough finally resolved and she feels at baseline now-also with some cough and chest congestion but little sputum, no fever, sweat or adenopathy.. Albuterol rescue inhaler quit because of palpitation and tremor.  She continues Breo 100 as her only inhaler and feels this is usually sufficient.  ROS- see HPI  + = positive Constitutional:   No-   weight loss, night sweats, fevers, chills,+ fatigue, lassitude. HEENT:   No-  headaches, difficulty swallowing, tooth/dental problems, sore throat,   No-  sneezing, itching, ear ache, nasal congestion, post nasal drip,  CV:   +chest pain, orthopnea, PND, swelling in lower extremities, anasarca, dizziness, palpitations Resp: No-   shortness of breath with exertion or at rest.              productive cough,  + non-productive cough,  No-  coughing up of blood.              No-   change in color of mucus.  No- wheezing.   Skin: no-pruritus or rash  GI:  No-   heartburn, indigestion, abdominal pain, nausea, vomiting,  GU:. MS:  No-   joint pain or swelling.  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 No depression or anxiety.  No memory loss.  Obj General- Alert, Oriented, Affect-appropriate, Distress- none acute, +looks more frail                    + Always slumped, almost kyphotic posture Skin- + hyperpigmentation right forearm attributed to shingles Lymphadenopathy- none Head- atraumatic            Eyes- Gross vision intact, PERRLA, conjunctivae clear secretions            Ears- Hearing, canals-normal            Nose- Clear, no-Septal dev, mucus, polyps, erosion, perforation             Throat- Mallampati II , mucosa clear , drainage- none, tonsils- atrophic. + Dentures Neck- flexible , trachea midline, no stridor , thyroid nl, carotid no bruit Chest - symmetrical excursion , unlabored           Heart/CV- RRR with skipped beats , no murmur , no gallop  , no rub, nl s1 s2                -  JVD- none , edema- none, stasis changes- none, varices- none           Lung- + bilateral coarse rhonchi ,  Work of breathing is not increased.   cough- none ,                           dullness-none, rub- none           Chest wall- +treatment Scar R breast, L upper anterior chest,  Abd- No HSM Br/ Gen/ Rectal- Not done, not indicated Extrem- cyanosis- none, clubbing, none, atrophy- none, strength- nl Neuro- +Tremor .

## 2018-01-03 NOTE — Patient Instructions (Addendum)
Ok to continue Breo 100      Inhale 1 puff, then rinse mouth, once daily  I will compare your latest chest CT to the old one and we will call you with my impression.

## 2018-01-04 ENCOUNTER — Ambulatory Visit: Payer: Medicare Other | Admitting: Podiatry

## 2018-01-04 DIAGNOSIS — L84 Corns and callosities: Secondary | ICD-10-CM | POA: Diagnosis not present

## 2018-01-04 DIAGNOSIS — M79676 Pain in unspecified toe(s): Secondary | ICD-10-CM

## 2018-01-04 DIAGNOSIS — B351 Tinea unguium: Secondary | ICD-10-CM

## 2018-01-04 NOTE — Assessment & Plan Note (Signed)
Recent sustained exacerbation likely viral triggered.  She is down close to baseline now, never with clear chest and often with some intermittent raspy cough.  I do not think additional antibiotics or intervention would help her much.  We discussed medication options again.

## 2018-01-04 NOTE — Assessment & Plan Note (Signed)
Once.  Needs to maintain reflux precautions to avoid aggravating bronchitis.  She understands this.

## 2018-01-04 NOTE — Assessment & Plan Note (Signed)
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  We will continue surveillance but I believe this is stable.

## 2018-01-06 NOTE — Progress Notes (Signed)
Subjective: 77 y.o. returns the office today for painful, elongated, thickened toenails which she cannot trim herself. Denies any redness or drainage around the nails. She also still gets a painful  corn the left fifth toe.  She states that she is not comfortable in any shoe denies any redness or drainage coming from the area.  Denies any acute changes since last appointment and no new complaints today. Denies any systemic complaints such as fevers, chills, nausea, vomiting.   PCP: Prince Solian, MD  Objective: AAO 3, NAD DP/PT pulses palpable, CRT less than 3 seconds Nails hypertrophic, dystrophic, elongated, brittle, discolored 10. There is tenderness overlying the nails 1-5 bilaterally. There is no surrounding erythema or drainage along the nail sites. Hyperkeratotic lesion left dorsal lateral fifth toe at the PIPJ.  Upon debridement there is no underlying ulceration, drainage or any signs of infection present. No open lesions or pre-ulcerative lesions are identified. No other areas of tenderness bilateral lower extremities. No overlying edema, erythema, increased warmth. No pain with calf compression, swelling, warmth, erythema.  Assessment: Patient presents with symptomatic onychomycosis; hyperkeratotic lesion  Plan: -Treatment options including alternatives, risks, complications were discussed -Nails sharply debrided 10 without complication/bleeding. -Hyperkeratotic lesion was sharply debrided x1 without any complications or bleeding.  Continue offloading. -Discussed daily foot inspection. If there are any changes, to call the office immediately.  -Follow-up in 3 months or sooner if any problems are to arise. In the meantime, encouraged to call the office with any questions, concerns, changes symptoms.  Celesta Gentile, DPM

## 2018-01-09 ENCOUNTER — Other Ambulatory Visit: Payer: Self-pay | Admitting: Neurology

## 2018-02-01 ENCOUNTER — Other Ambulatory Visit: Payer: Self-pay | Admitting: Gastroenterology

## 2018-02-01 DIAGNOSIS — R140 Abdominal distension (gaseous): Secondary | ICD-10-CM

## 2018-02-02 ENCOUNTER — Other Ambulatory Visit: Payer: Self-pay | Admitting: Neurology

## 2018-02-07 ENCOUNTER — Other Ambulatory Visit: Payer: Medicare Other

## 2018-02-10 ENCOUNTER — Ambulatory Visit
Admission: RE | Admit: 2018-02-10 | Discharge: 2018-02-10 | Disposition: A | Discharge status: Home / Self Care | Payer: Medicare Other | Source: Ambulatory Visit | Attending: Gastroenterology | Admitting: Gastroenterology

## 2018-02-10 DIAGNOSIS — R140 Abdominal distension (gaseous): Secondary | ICD-10-CM

## 2018-02-10 MED ORDER — IOPAMIDOL (ISOVUE-300) INJECTION 61%
100.0000 mL | Freq: Once | INTRAVENOUS | Status: AC | PRN
  Administered 2018-02-10: 100 mL via INTRAVENOUS

## 2018-03-01 ENCOUNTER — Other Ambulatory Visit: Payer: Self-pay

## 2018-03-01 ENCOUNTER — Ambulatory Visit (HOSPITAL_COMMUNITY)
Admission: EM | Admit: 2018-03-01 | Discharge: 2018-03-01 | Disposition: A | Discharge status: Home / Self Care | Payer: Medicare Other | Attending: Family Medicine | Admitting: Family Medicine

## 2018-03-01 ENCOUNTER — Encounter (HOSPITAL_COMMUNITY): Payer: Self-pay | Admitting: Emergency Medicine

## 2018-03-01 DIAGNOSIS — R21 Rash and other nonspecific skin eruption: Secondary | ICD-10-CM | POA: Diagnosis not present

## 2018-03-01 MED ORDER — HYDROCORTISONE VALERATE 0.2 % EX OINT: 1.0000 "application " | TOPICAL_OINTMENT | Freq: Two times a day (BID) | CUTANEOUS | 0 refills | Status: DC

## 2018-03-01 NOTE — ED Triage Notes (Signed)
Pt reports a dark rash on both lower legs and both upper arms.  She also reports a possible insect bite on both shins laterally.  Pt tried Benadryl Cream and Cortisone with no.

## 2018-03-01 NOTE — Discharge Instructions (Signed)
Start westcort on the rash/darker skin area. Discontinue soap. Apply lotion and continue benadryl at night for itching. Monitor for spreading redness, increased warmth, pain, fever, follow up for reevaluation. Otherwise, follow up with PCP for further management needed.

## 2018-03-01 NOTE — ED Provider Notes (Signed)
[facility]    CSN: [id number] Arrival date & time: 03/01/18  1428     History   Chief Complaint Chief Complaint  Patient presents with  . Rash  . Insect Bite    HPI Serene Kopf Sanderford is a 77 y.o. female.   77 year old female with history of anemia, breast cancer, HLD, HTN, pulmonary sarcoidosis comes in for 2 day history of rash and itching. States that had 2 insect bites, one in each lower extremity, but whole leg is now itching. Now with hyperpigmentation of the skin. She also has itching of the upper arms and mid back. States started using dove 1 week ago, although she was told not to. Denies pain. Denies spreading erythema, increased warmth, fever. Hydrocortisone cream and benadryl without relief.      Past Medical History:  Diagnosis Date  . Anemia   . Anxiety   . Cancer ([place])    breast ca  . Chest pain    echo 01/08/10- nl lv; mild aortic sclerosis; myoview 01/08/10- no ischemia  . Dyslipidemia    myaligia with statins  . Esophageal reflux   . Hypertension   . Personal history of chemotherapy 2004  . Personal history of radiation therapy 2004  . Pulmonary sarcoidosis ([place])   . Shingles 03/2016  . Tremor    upper extremities  . Ventricular tachycardia (paroxysmal) ([facility])    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; AV node reentry tachycardia ablation     Patient Active Problem List   Diagnosis Date Noted  . 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 Ulcer of left heel, limited to breakdown of skin ([place]) 11/04/2017  . Seasonal and perennial allergic rhinitis 12/31/2016  . Hypercholesterolemia 12/01/2016  . Tremor 10/07/2016  . Left lumbar radiculopathy 10/07/2016  . Chronic obstructive bronchitis without exacerbation ([place]) 04/22/2015  . LBBB (left bundle branch block) 06/09/2014  . Paresthesia 03/30/2014  . Chest pain, atypical 12/09/2013  . Itching 06/28/2013  . Right leg pain 03/02/2013  . Essential hypertension 03/02/2013  . GLAUCOMA  02/27/2010  . Essential tremor 08/30/2009  . Bronchitis, chronic obstructive, with exacerbation ([place]) 07/30/2008  . PULMONARY SARCOIDOSIS 11/04/2007  . Malignant neoplasm of upper outer quadrant of female breast ([place]) 11/04/2007  . ANEMIA 11/04/2007  . ANXIETY 11/04/2007  . Paroxysmal supraventricular tachycardia ([place]) 11/04/2007  . ESOPHAGEAL REFLUX 11/04/2007    Past Surgical History:  Procedure Laterality Date  . Boil Right 03/2015   on buttock, excision  . BREAST LUMPECTOMY Right 2004  . left breast lumpectomy,chemo  2004   xrt  . SKIN BIOPSY    . UPPER GI ENDOSCOPY  02/2015   difficulty swallowing, Abd pain    OB History   None      Home Medications    Prior to Admission medications   Medication Sig Start Date End Date Taking? Authorizing Provider  acetaminophen (TYLENOL) 325 MG tablet Take 650 mg by mouth every 6 (six) hours as needed for mild pain.    [provider]  aspirin 81 MG tablet Take 81 mg by mouth every other day.     [provider]  benzonatate (TESSALON) 200 MG capsule Take 1 capsule (200 mg total) by mouth every 8 (eight) hours as needed for cough. Patient not taking: Reported on 01/03/2018 11/24/17   Deneise Lever, MD  diazepam (VALIUM) 2 MG tablet Take 2 mg by mouth every 6 (six) hours as needed for anxiety.    [provider]  doxycycline (VIBRA-TABS) 100 MG tablet Take 2 tablets today, then 1 tablet daily until finished. Patient not taking: Reported on 01/03/2018 10/27/17   Deneise Lever, MD  fluticasone [facility]) 50 MCG/ACT nasal spray SPRAY 2 SPRAYS INTO EACH NOSTRIL EVERY DAY 08/09/17   [provider]  fluticasone furoate-vilanterol (BREO ELLIPTA) 100-25 MCG/INH AEPB Inhale 1 puff into the lungs daily. 07/05/17   Young, Kasandra Knudsen, MD  HYDROcodone-homatropine [facility]) 5-1.5 MG/5ML syrup Take 5 mLs by mouth every 6 (six) hours as needed for cough. 12/14/17   Deneise Lever, MD  hydrocortisone valerate ointment  (WESTCORT) 0.2 % Apply 1 application topically 2 (two) times daily. 03/01/18   Tasia Catchings, Karrington Studnicka V, PA-C  metoprolol tartrate (LOPRESSOR) 25 MG tablet Take 12.5 mg by mouth as needed.     [provider]  ondansetron (ZOFRAN) 4 MG tablet Take 4 mg by mouth every 8 (eight) hours as needed for nausea.  12/24/15   [provider]  pantoprazole (PROTONIX) 40 MG tablet Take 40 mg by mouth daily.    [provider]  primidone (MYSOLINE) 50 MG tablet TAKE 1 TABLET (50 MG TOTAL) BY MOUTH 3 (THREE) TIMES DAILY. 01/10/18   Marcial Pacas, MD  ranitidine (ZANTAC) 150 MG capsule Take 150 mg by mouth daily.    [provider]  traMADol (ULTRAM) 50 MG tablet Take 100 mg by mouth every 8 (eight) hours as needed. for pain 12/20/15   [provider]  valsartan-hydrochlorothiazide (DIOVAN-HCT) 80-12.5 MG per tablet Take 1 tablet by mouth daily. 05/29/15   [provider]    Family History Family History  Problem Relation Age of Onset  . Heart failure Father 41  . Cancer Mother   . Heart attack Sister        2 heart attacks  . Breast cancer Neg Hx     Social History Social History   Tobacco Use  . Smoking status: Never Smoker  . Smokeless tobacco: Never Used  Substance Use Topics  . Alcohol use: No  . Drug use: No     Allergies   Dextromethorphan polistirex er; Primidone; Albuterol; Amoxicillin; Asa [aspirin]; Loratadine; Nortriptyline; and Simvastatin   Review of Systems Review of Systems  Reason unable to perform ROS: See HPI as above.     Physical Exam Triage Vital Signs ED Triage Vitals  Enc Vitals Group     BP 03/01/18 1444 (!) 163/70     Pulse Rate 03/01/18 1444 97     Resp --      Temp 03/01/18 1444 98 F (36.7 C)     Temp Source 03/01/18 1444 Oral     SpO2 03/01/18 1444 97 %     Weight --      Height --      Head Circumference --      Peak Flow --      Pain Score 03/01/18 1443 0     Pain Loc --      Pain Edu? --      Excl. in [place]? --      No data found.  Updated Vital Signs BP (!) 163/70 (BP Location: Left Arm)   Pulse 97   Temp 98 F (36.7 C) (Oral)   SpO2 97%   Physical Exam  Constitutional: She is oriented to person, place, and time. She appears well-developed and well-nourished. No distress.  HENT:  Head: Normocephalic and atraumatic.  Eyes: Pupils are equal, round, and reactive to light. Conjunctivae are normal.  Neurological: She is alert and oriented to person, place, and time.  Skin:  Two maculopapular rash, one on left lateral lower extremity, and the other on right lateral lower extremity, appearance consistent with insect bite. Mild surrounding erythema without increased warmth. Hyperpigmentation of the lower extremity, see picture below.  No rashes seen of the upper arms, and back.             UC Treatments / Results  Labs (all labs ordered are listed, but only abnormal results are displayed) Labs Reviewed - No data to display  EKG None  Radiology No results found.  Procedures Procedures (including critical care time)  Medications Ordered in UC Medications - No data to display  Initial Impression / Assessment and Plan / UC Course  I have reviewed the triage vital signs and the nursing notes.  Pertinent labs & imaging results that were available during my care of the patient were reviewed by me and considered in my medical decision making (see chart for details).    Start westcort for affected area. Discontinue soap and apply lotion for dry skin. Can continue benadryl for itching. Return precautions given. Otherwise follow up with dermatologist as scheduled for reevaluation needed.   Case discussed with Dr Meda Coffee, who agrees to plan.  Final Clinical Impressions(s) / UC Diagnoses   Final diagnoses:  Rash    ED Prescriptions    Medication Sig Dispense Auth. Provider   hydrocortisone valerate ointment (WESTCORT) 0.2 % Apply 1 application topically 2 (two) times daily. 45 g  Tobin Chad, [place] 03/01/18 1541

## 2018-04-07 ENCOUNTER — Encounter: Payer: Self-pay | Admitting: Podiatry

## 2018-04-07 ENCOUNTER — Ambulatory Visit: Payer: Medicare Other | Admitting: Podiatry

## 2018-04-07 DIAGNOSIS — M79676 Pain in unspecified toe(s): Secondary | ICD-10-CM | POA: Diagnosis not present

## 2018-04-07 DIAGNOSIS — B351 Tinea unguium: Secondary | ICD-10-CM

## 2018-04-07 DIAGNOSIS — L84 Corns and callosities: Secondary | ICD-10-CM | POA: Diagnosis not present

## 2018-04-10 NOTE — Progress Notes (Signed)
Subjective: 77 y.o. returns the office today for painful, elongated, thickened toenails which she cannot trim herself as well as for a painful corn on the left 5th toe. Denies any redness or drainage around the nails/callus. Denies any acute changes since last appointment and no new complaints today. Denies any systemic complaints such as fevers, chills, nausea, vomiting.   PCP: Prince Solian, MD  Objective: AAO 3, NAD DP/PT pulses palpable, CRT less than 3 seconds Nails hypertrophic, dystrophic, elongated, brittle, discolored 10. There is tenderness overlying the nails 1-5 bilaterally. There is no surrounding erythema or drainage along the nail sites. Hyperkeratotic lesion left dorsal lateral fifth toe at the PIPJ.  Upon debridement there is no underlying ulceration, drainage or any signs of infection present. No open lesions or pre-ulcerative lesions are identified. No other areas of tenderness bilateral lower extremities. No overlying edema, erythema, increased warmth. No pain with calf compression, swelling, warmth, erythema.  Assessment: Patient presents with symptomatic onychomycosis; hyperkeratotic lesion  Plan: -Treatment options including alternatives, risks, complications were discussed -Nails sharply debrided 10 without complication/bleeding. -Hyperkeratotic lesion was sharply debrided x1 without any complications or bleeding.  Continue offloading. -Discussed daily foot inspection. If there are any changes, to call the office immediately.  -Follow-up in 3 months or sooner if any problems are to arise. In the meantime, encouraged to call the office with any questions, concerns, changes symptoms.  Celesta Gentile, DPM

## 2018-04-25 IMAGING — MG DIGITAL SCREENING BILATERAL MAMMOGRAM WITH CAD
4 series · 4 of 4 positions shown · non-contrast
Comparison: Previous exam(s).

ACR Breast Density Category a: The breast tissue is almost entirely
fatty.

CLINICAL DATA: Screening. History of right breast cancer and right
lumpectomy in 2777.

EXAM:
DIGITAL SCREENING BILATERAL MAMMOGRAM WITH CAD

[R MLO]
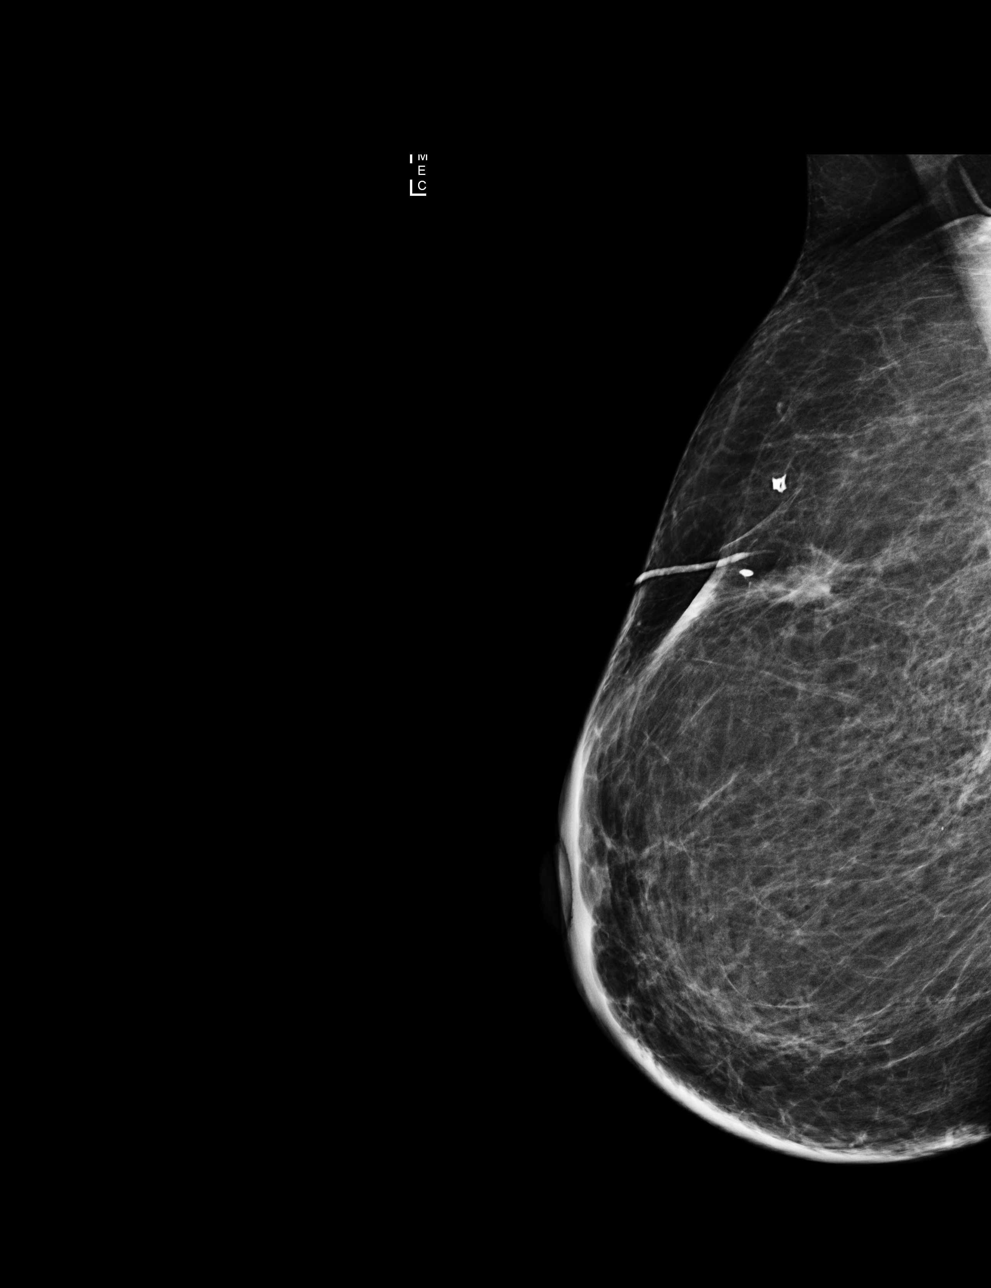

[L CC]
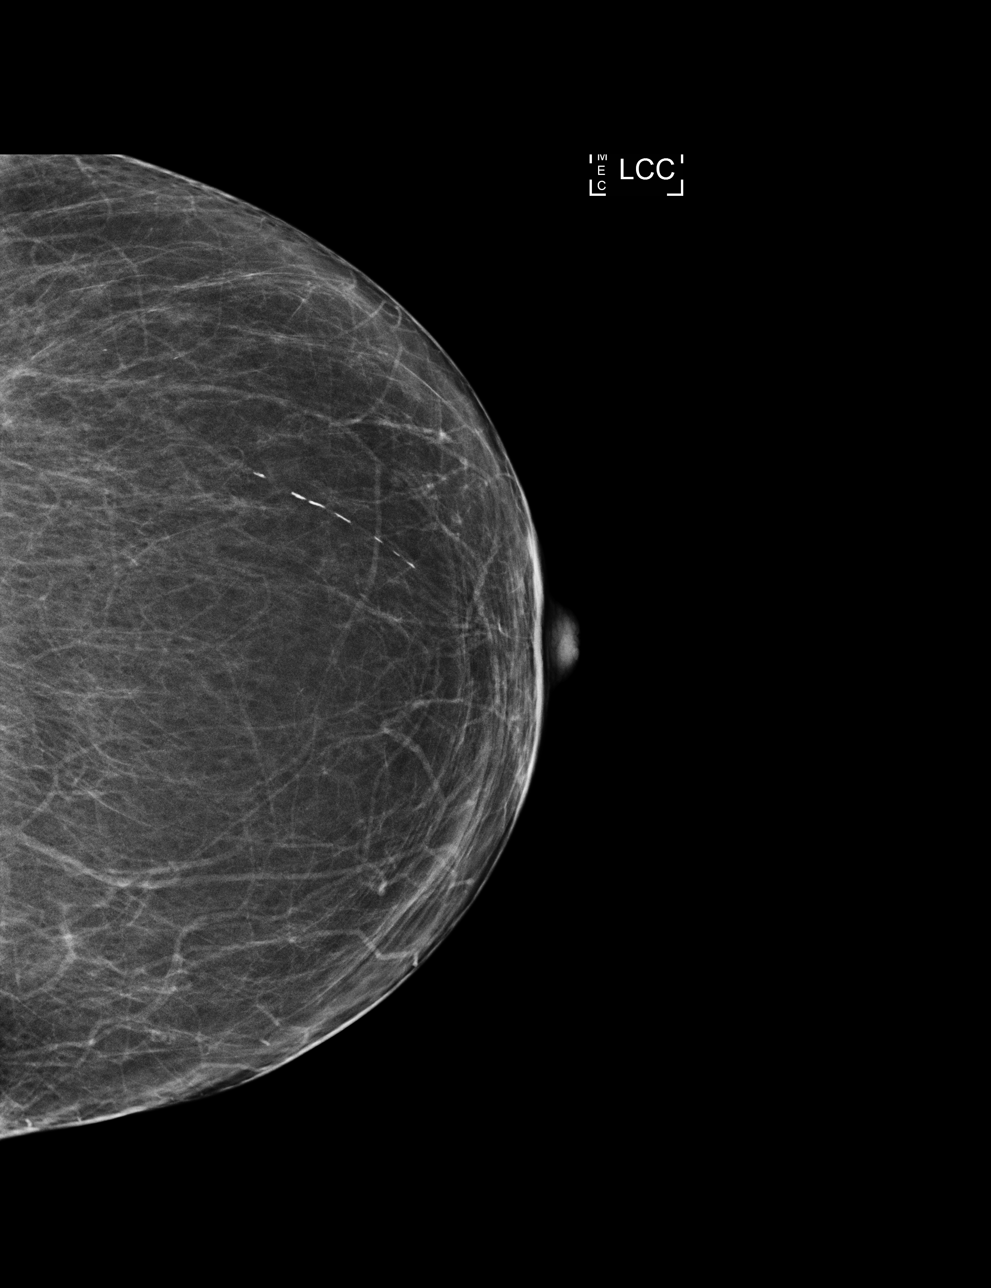

[R CC]
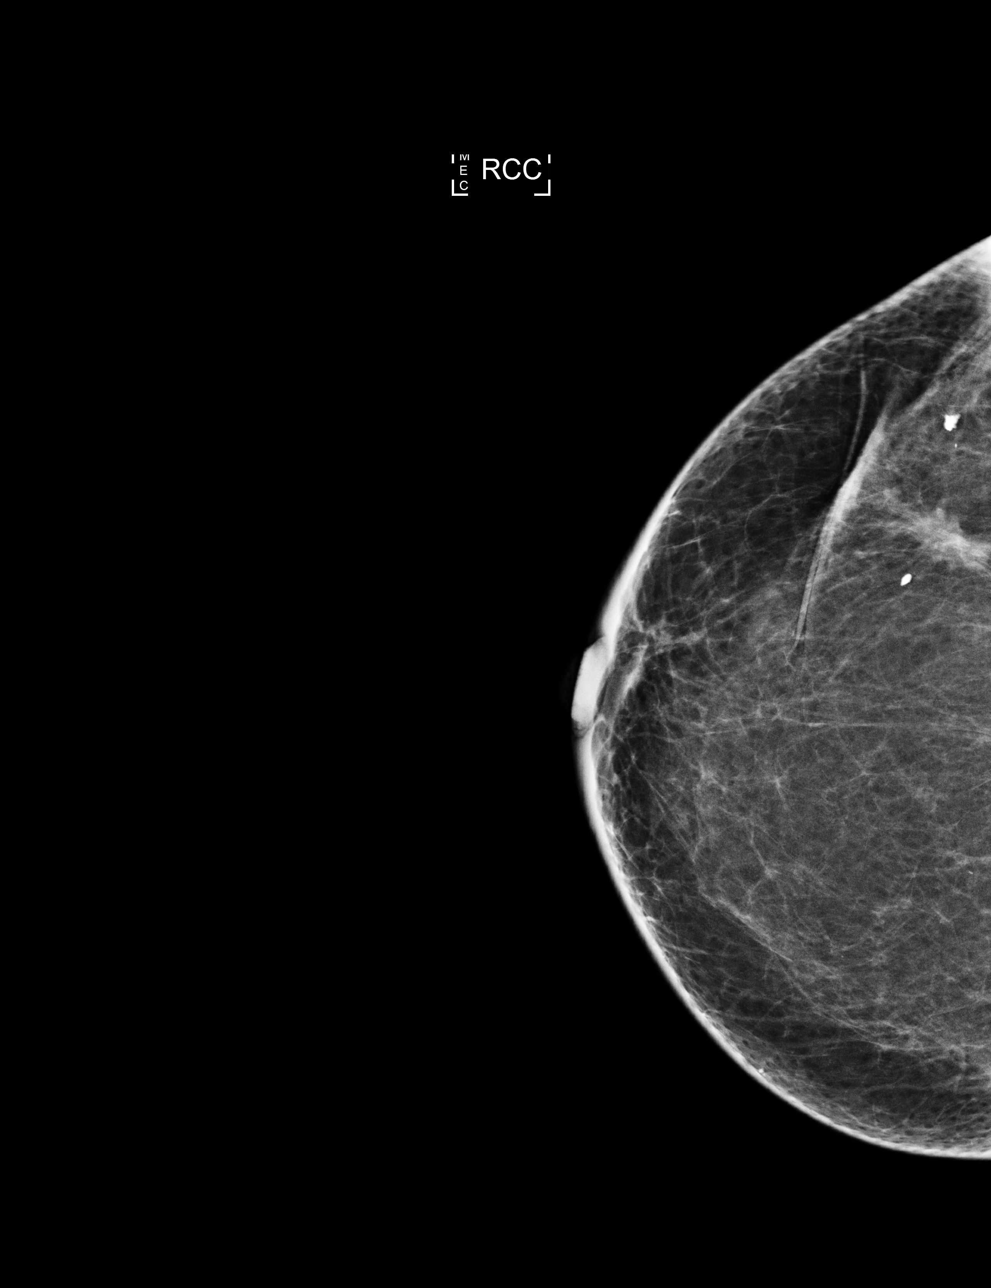

[L MLO]
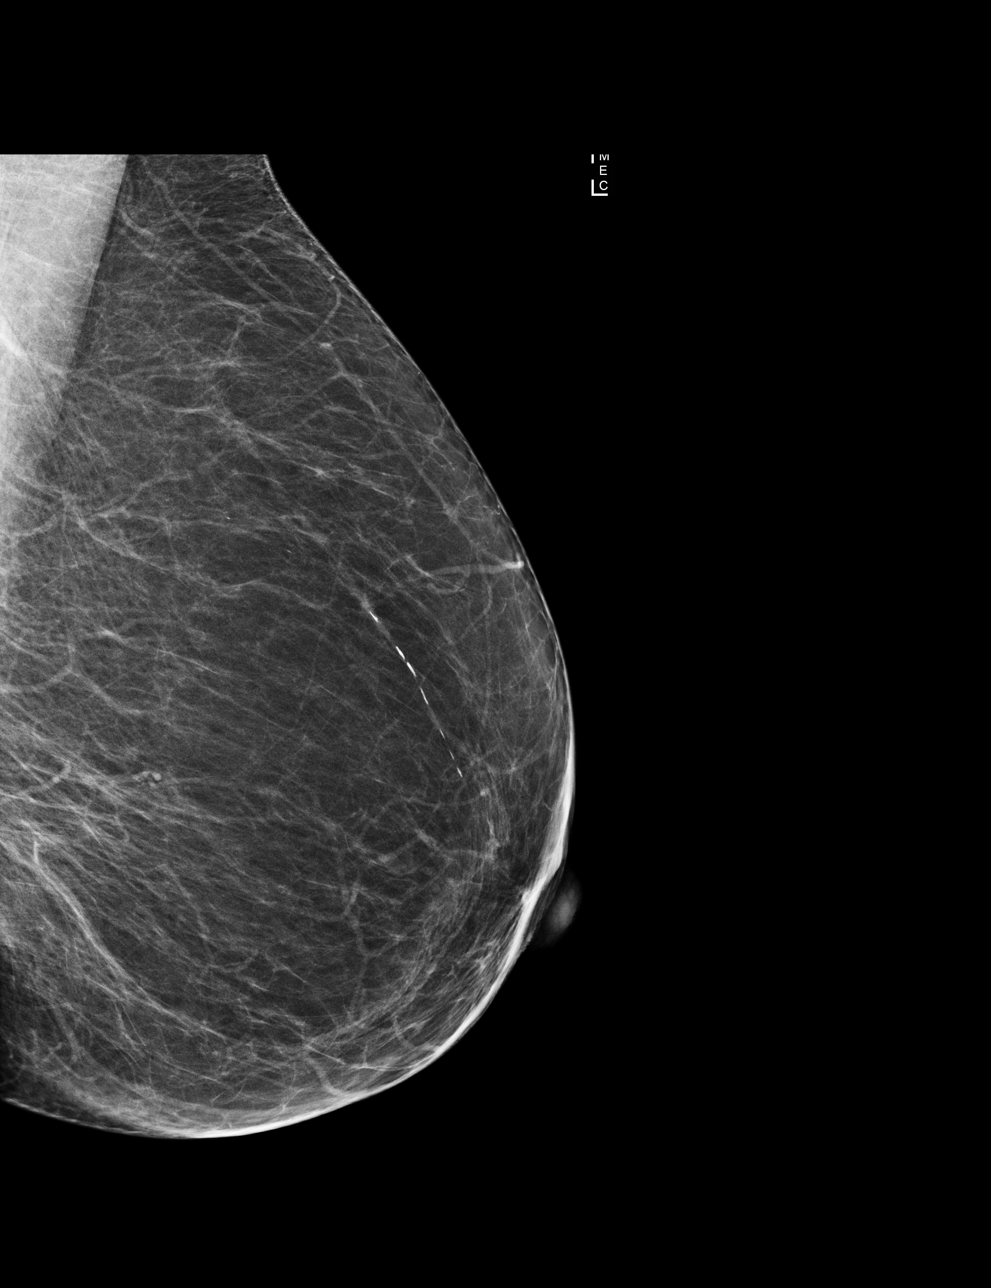

[4 of 4 positions shown; findings below may reference images not displayed]

FINDINGS: There are no findings suspicious for malignancy.

Right breast lumpectomy changes again noted.

Images were processed with CAD.
IMPRESSION: No mammographic evidence of malignancy. A result letter of this
screening mammogram will be mailed directly to the patient.

RECOMMENDATION:
Screening mammogram in one year. (Code:D7-2-PID)

BI-RADS CATEGORY  2: Benign.

## 2018-04-27 ENCOUNTER — Other Ambulatory Visit: Payer: Self-pay | Admitting: Internal Medicine

## 2018-04-27 DIAGNOSIS — Z1231 Encounter for screening mammogram for malignant neoplasm of breast: Secondary | ICD-10-CM

## 2018-05-19 ENCOUNTER — Other Ambulatory Visit: Payer: Self-pay | Admitting: Neurology

## 2018-05-31 ENCOUNTER — Encounter: Payer: Self-pay | Admitting: Neurology

## 2018-05-31 ENCOUNTER — Ambulatory Visit: Payer: Medicare Other | Admitting: Neurology

## 2018-05-31 VITALS — BP 114/64 | HR 58 | Ht 63.0 in | Wt 134.0 lb

## 2018-05-31 DIAGNOSIS — G250 Essential tremor: Secondary | ICD-10-CM

## 2018-05-31 MED ORDER — PRIMIDONE 50 MG PO TABS: 50.0000 mg | ORAL_TABLET | Freq: Three times a day (TID) | ORAL | 4 refills | Status: DC

## 2018-05-31 NOTE — Progress Notes (Signed)
[facility]  PATIENT: Danielle Peters DOB: 02-Sep-1941   HISTORY OF PRESENT ILLNESS: Mrs. Scale is a 77 year old right-handed American female, follow up for essential tremor   She has past medical history of hypertension, hyperlipidemia, anxiety, history of right breast cancer, status post lobectomy in 2004, followed by radiation and chemotherapy, she developed bilateral hands paresthesia following chemotherapy consistent with-induced peripheral neuropathy. She presenting with few years history of bilateral hands tremor, getting worse when she stretched out her hands, or using utensils, she has describe difficulty feeding herself, cause a lot of social embarrasement, she denies significant gait difficulty, she also has head titubation She denied bilateral lower exremity paresthesia weakness, but does complain fingertips numbness tingling. She denied loss of smell, REM sleep disorder, has anxiety symptoms, denied family history of tremor Over the past few years, we have tried primadone, while taking 50mg  1/2 once a day , there was no significant improvement at that time, she also complains of headaches, but also tried diazepam, Ativan, without significant improvement  UPDATE Oct 08 2014:  She is no longer taking primidone, she is taking clonazepam 0.5 mg half to one tablets every morning, which has helped her tremor, she complains of anxiety, but does not want to take more medications for it  UPDATE Oct 07 2016:  She took clonazepam 0.5 milligrams half to 1 tablet as needed for tremor for a while without significant improvement, she continue have worsening bilateral hands resting and posturing tremor, occasionally involving bilateral lower extremity, she denies significant gait abnormality, she exercise regularly, she complains of recent onset of left-sided low back pain, radiating pain to left lower extremity, left calf muscle. She is taking Lyrica 75 mg a day  She just  healed from right arm shingles in June 2017, still has intermittent itching in her right arm and hand,  UPDATE January 05 2017: She complains of constant bilateral hand resting and posturing tremor, previously tried primidone, clonazepam with limited help, currently she is taking Valium 2 mg as needed, which helps her some, she previously took metoprolol low-dose did not notice any help for her tremor,  This is most consistent with essential tremor, there was no family history of similar disease, no parkinsonian features.  Her low back pain has much improved, she has stopped taking nortriptyline  UPDATE May 31 2018: Her older sister has parkinson's disease.  She now noticed worsening bilateral hands tremor, previously could not tolerate clonazepam cause excessive drowsiness, beta-blocker, she did not notice any significant benefit, she is now taking primidone 50 mg twice a day, higher dose would cause significant drowsiness, she denied loss of sense of smell, she denies significant gait abnormality.   REVIEW OF SYSTEMS: Full 14 system review of systems performed and notable only for those listed, all others are neg:     ALLERGIES: Allergies  Allergen Reactions  . Dextromethorphan Polistirex Er Other (See Comments)    SOB  . Primidone     Severe Headaches  . Albuterol     REACTION: tachycardia  . Amoxicillin Diarrhea and Nausea Only  . Asa [Aspirin]     Adult dose  . Loratadine   . Nortriptyline     Increase heart rate.  . Simvastatin     Myalgias     HOME MEDICATIONS: Outpatient Medications Prior to Visit  Medication Sig Dispense Refill  . acetaminophen (TYLENOL) 325 MG tablet Take 650 mg by mouth every 6 (six) hours as needed for mild pain.    [name]  aspirin 81 MG tablet Take 81 mg by mouth every other day.     . diazepam (VALIUM) 2 MG tablet Take 2 mg by mouth every 6 (six) hours as needed for anxiety.    . fluticasone (FLONASE) 50 MCG/ACT nasal spray SPRAY 2 SPRAYS INTO EACH  NOSTRIL EVERY DAY  11  . fluticasone furoate-vilanterol (BREO ELLIPTA) 100-25 MCG/INH AEPB Inhale 1 puff into the lungs daily. 1 each 0  . hydrocortisone valerate ointment (WESTCORT) 0.2 % Apply 1 application topically 2 (two) times daily. 45 g 0  . metoprolol tartrate (LOPRESSOR) 25 MG tablet Take 12.5 mg by mouth as needed.     . ondansetron (ZOFRAN) 4 MG tablet Take 4 mg by mouth every 8 (eight) hours as needed for nausea.     . pantoprazole (PROTONIX) 40 MG tablet Take 40 mg by mouth daily.    . primidone (MYSOLINE) 50 MG tablet TAKE 1 TABLET (50 MG TOTAL) BY MOUTH 3 (THREE) TIMES DAILY. 90 tablet 0  . ranitidine (ZANTAC) 150 MG capsule Take 150 mg by mouth daily.    . traMADol (ULTRAM) 50 MG tablet Take 100 mg by mouth every 8 (eight) hours as needed. for pain  4  . valsartan-hydrochlorothiazide (DIOVAN-HCT) 80-12.5 MG per tablet Take 1 tablet by mouth daily.  4  . benzonatate (TESSALON) 200 MG capsule Take 1 capsule (200 mg total) by mouth every 8 (eight) hours as needed for cough. 30 capsule 0  . doxycycline (VIBRA-TABS) 100 MG tablet Take 2 tablets today, then 1 tablet daily until finished. 8 tablet 0  . HYDROcodone-homatropine (HYCODAN) 5-1.5 MG/5ML syrup Take 5 mLs by mouth every 6 (six) hours as needed for cough. 140 mL 0   No facility-administered medications prior to visit.     PAST MEDICAL HISTORY: Past Medical History:  Diagnosis Date  . Anemia   . Anxiety   . Cancer ([place])    breast ca  . Chest pain    echo 01/08/10- nl lv; mild aortic sclerosis; myoview 01/08/10- no ischemia  . Dyslipidemia    myaligia with statins  . Esophageal reflux   . Hypertension   . Personal history of chemotherapy 2004  . Personal history of radiation therapy 2004  . Pulmonary sarcoidosis ([place])   . Shingles 03/2016  . Tremor    upper extremities  . Ventricular tachycardia (paroxysmal) ([facility])    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; AV node reentry tachycardia ablation     PAST SURGICAL  HISTORY: Past Surgical History:  Procedure Laterality Date  . Boil Right 03/2015   on buttock, excision  . BREAST LUMPECTOMY Right 2004  . left breast lumpectomy,chemo  2004   xrt  . SKIN BIOPSY    . UPPER GI ENDOSCOPY  02/2015   difficulty swallowing, Abd pain    FAMILY HISTORY: Family History  Problem Relation Age of Onset  . Heart failure Father 82  . Cancer Mother   . Heart attack Sister        2 heart attacks  . Breast cancer Neg Hx     SOCIAL HISTORY: Social History   Socioeconomic History  . Marital status: Divorced    Spouse name: Not on file  . Number of children: 2  . Years of education: 61  . Highest education level: Not on file  Occupational History  . 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 Financial resource strain: Not on file  . Food insecurity:    Worry: Not on  file    Inability: Not on file  . Transportation needs:    Medical: Not on file    Non-medical: Not on file  Tobacco Use  . Smoking status: Never Smoker  . Smokeless tobacco: Never Used  Substance and Sexual Activity  . Alcohol use: No  . Drug use: No  . Sexual activity: Not on file  Lifestyle  . Physical activity:    Days per week: Not on file    Minutes per session: Not on file  . Stress: Not on file  Relationships  . Social connections:    Talks on phone: Not on file    Gets together: Not on file    Attends religious service: Not on file    Active member of club or organization: Not on file    Attends meetings of clubs or organizations: Not on file    Relationship status: Not on file  . Intimate partner violence:    Fear of current or ex partner: Not on file    Emotionally abused: Not on file    Physically abused: Not on file    Forced sexual activity: Not on file  Other Topics Concern  . Not on file  Social History Narrative   Patient lives at home alone. Patient is retired.Pateint has high school education. And went to [employer].   Right handed.    Caffeine- one cup daily.     PHYSICAL EXAM  Vitals:   05/31/18 0938  BP: 114/64  Pulse: (!) 58  Weight: 134 lb (60.8 kg)  Height: 5\' 3"  (1.6 m)   Body mass index is 23.74 kg/m. Generalized: Well developed, in no acute distress  Head: normocephalic and atraumatic,. Oropharynx benign  Neck: Supple, no carotid bruits  Cardiac: Regular rate rhythm, no murmur  Musculoskeletal: No deformity   Neurological examination She has mild anterocollis  Mentation: Alert oriented to time, place, history taking. Follows all commands speech and language fluent  Cranial nerve II-XII: Pupils were equal round reactive to light extraocular movements were full, visual field were full on confrontational test. Facial sensation and strength were normal. hearing loss left ear. Uvula tongue midline. head turning and shoulder shrug and were normal and symmetric.Tongue protrusion into cheek strength was normal. She has occasional head No-No tremor. Motor:  she has bilateral upper extremity resting and postural tremor, right more than left, There was no significant rigidity, No bradykinesia Sensory: normal and symmetric to light touch, pinprick, and vibration  Coordination: finger-nose-finger, heel-to-shin bilaterally, no dysmetria Reflexes: Brachioradialis 2/2, biceps 2/2, triceps 2/2, patellar 2/2, Achilles 2/2, plantar responses were flexor bilaterally. Gait and Station: Rising up from seated position without assistance, Leaning forward, anterocollis, mildly unsteady, moderate stride, smooth turning  DIAGNOSTIC DATA (LABS, IMAGING, TESTING) - I reviewed patient records, labs, notes, testing and imaging myself where available.  Lab Results  Component Value Date   WBC 7.5 01/02/2016   HGB 13.6 01/02/2016   HCT 42.1 01/02/2016   MCV 85.9 01/02/2016   PLT 162 01/02/2016      Component Value Date/Time   NA 139 11/16/2016 1001   K 4.2 11/16/2016 1001   CL 99 11/16/2016 1001   CO2 28 11/16/2016  1001   GLUCOSE 102 (H) 11/16/2016 1001   BUN 15 11/16/2016 1001   CREATININE 0.93 11/16/2016 1001   CALCIUM 10.5 (H) 11/16/2016 1001   PROT 7.6 05/05/2010 1438   ALBUMIN 3.9 05/05/2010 1438   AST 29 05/05/2010 1438   ALT 19 05/05/2010  1438   ALKPHOS 105 05/05/2010 1438   BILITOT 1.0 05/05/2010 1438   GFRNONAA 45 (L) 01/02/2016 1058   GFRAA 52 (L) 01/02/2016 1058       ASSESSMENT AND PLAN 77 y.o. year old female  Essential tremor:  Resting and posturing component  No evidence of parkinsonian features,  Previously she has tried and failed clonazepam,  low dose beta blocker, without significant benefit, could not tolerate it due to significant side effect  Mild benefit with low-dose primidone, can increase primidone to higher dose 50/100 mg daily       Marcial Pacas, M.D. Ph.D.  [facility] [place], [place] [address] Phone: [phone] Fax:      [phone]

## 2018-06-06 ENCOUNTER — Ambulatory Visit
Admission: RE | Admit: 2018-06-06 | Discharge: 2018-06-06 | Disposition: A | Discharge status: Home / Self Care | Payer: Medicare Other | Source: Ambulatory Visit | Attending: Internal Medicine | Admitting: Internal Medicine

## 2018-06-06 DIAGNOSIS — Z1231 Encounter for screening mammogram for malignant neoplasm of breast: Secondary | ICD-10-CM

## 2018-07-06 ENCOUNTER — Encounter: Payer: Self-pay | Admitting: Internal Medicine

## 2018-07-06 ENCOUNTER — Ambulatory Visit: Payer: Medicare Other | Admitting: Internal Medicine

## 2018-07-06 VITALS — BP 120/72 | HR 94 | Ht 63.0 in | Wt 134.8 lb

## 2018-07-06 DIAGNOSIS — J441 Chronic obstructive pulmonary disease with (acute) exacerbation: Secondary | ICD-10-CM | POA: Diagnosis not present

## 2018-07-06 DIAGNOSIS — D869 Sarcoidosis, unspecified: Secondary | ICD-10-CM | POA: Diagnosis not present

## 2018-07-06 DIAGNOSIS — C50411 Malignant neoplasm of upper-outer quadrant of right female breast: Secondary | ICD-10-CM

## 2018-07-06 MED ORDER — GLYCOPYRROLATE-FORMOTEROL 9-4.8 MCG/ACT IN AERO: 2.0000 | INHALATION_SPRAY | Freq: Two times a day (BID) | RESPIRATORY_TRACT | 0 refills | Status: DC

## 2018-07-06 MED ORDER — METHYLPREDNISOLONE ACETATE 80 MG/ML IJ SUSP
80.0000 mg | Freq: Once | INTRAMUSCULAR | Status: AC
  Administered 2018-07-06: 80 mg via INTRAMUSCULAR

## 2018-07-06 MED ORDER — LEVALBUTEROL HCL 0.63 MG/3ML IN NEBU
0.6300 mg | INHALATION_SOLUTION | Freq: Once | RESPIRATORY_TRACT | Status: AC
  Administered 2018-07-06: 0.63 mg via RESPIRATORY_TRACT

## 2018-07-06 NOTE — Progress Notes (Signed)
HPI  Female never smoker followed for sarcoid, chronic bronchitis complicated by history of right breast cancer/ XRT, tachycardia, GERD, anxiety, glaucoma, tremor.[name]  PCP Dr Lucien Mons Med Cardiac Study 11/25/16- EF 77%, hyperdynamic, otw  WNL Office Spirometry 12/31/2016-mild restriction of exhaled volume. FVC 1.61/77%, FEV1 1.37/86%, ratio 0.85, FEF 25-75% 2.00/ 144% -----------------------------------------------------------------------------------------. 01/03/2018- 77 year old female never smoker followed for sarcoid, chronic bronchitis, accompanied by history right breast CA/XRT, Tachycardia, GERD, anxiety, Glaucoma, tremor Has had a sustained bronchitis/cough this winter.  Evaluated at her PCP office and brings CXR disc and report which I have compared with prior imaging.  This looks like stable scarring behind right breast in the area of previous radiation therapy and sarcoid scarring.  Cough finally resolved and she feels at baseline now-also with some cough and chest congestion but little sputum, no fever, sweat or adenopathy.. Albuterol rescue inhaler quit because of palpitation and tremor.  She continues Breo 100 as her only inhaler and feels this is usually sufficient.  07/06/2018- 77 year old female never smoker followed for sarcoid, chronic bronchitis, accompanied by history right breast CA/XRT, Tachycardia, GERD, anxiety, Glaucoma, tremor -----Bronchitis: Pt notes SOB and cough-productive-clear in color.  Flonase, Breo 100, Increased watery eyes and nose she attributes to "allergy".  Variable cough remains nonproductive.  Using Breo inhaler only as needed saying it seems to make her more short of breath.  Told she has "a little bit of glaucoma in one eye". Exercises 3 days/week at [facility]  ROS- see HPI  + = positive Constitutional:   No-   weight loss, night sweats, fevers, chills,+ fatigue, lassitude. HEENT:   No-  headaches, difficulty swallowing, tooth/dental problems, sore throat,        No-  sneezing, itching, ear ache, nasal congestion, +post nasal drip,  CV:   +chest pain, orthopnea, PND, swelling in lower extremities, anasarca, dizziness, palpitations Resp: No-   shortness of breath with exertion or at rest.              productive cough,  + non-productive cough,  No-  coughing up of blood.              No-   change in color of mucus.  No- wheezing.   Skin: no-pruritus or rash  GI:  No-   heartburn, indigestion, abdominal pain, nausea, vomiting,  GU:. MS:  No-   joint pain or swelling.  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 No depression or anxiety.  No memory loss.  Obj General- Alert, Oriented, Affect-appropriate, Distress- none acute,                     + Always slumped, almost kyphotic posture Skin- + hyperpigmentation right forearm attributed to shingles Lymphadenopathy- none Head- atraumatic            Eyes- Gross vision intact, PERRLA, conjunctivae clear secretions            Ears- Hearing, canals-normal            Nose- Clear, no-Septal dev, mucus, polyps, erosion, perforation             Throat- Mallampati II , mucosa clear , drainage- none, tonsils- atrophic. + Dentures Neck- flexible , trachea midline, no stridor , thyroid nl, carotid no bruit Chest - symmetrical excursion , unlabored           Heart/CV- RRR with skipped beats , no murmur , no gallop  ,  no rub, nl s1 s2                - JVD- none , edema- none, stasis changes- none, varices- none           Lung- + bilateral coarse rhonchi ,  Work of breathing is not increased.   cough- none ,                           dullness-none, rub- none           Chest wall- +treatment Scar R breast, Abd- No HSM Br/ Gen/ Rectal- Not done, not indicated Extrem- cyanosis- none, clubbing, none, atrophy- none, strength- nl Neuro- +Tremor .

## 2018-07-06 NOTE — Patient Instructions (Addendum)
  Sample Bevespi inhaler   Try this instead of Breo to see if it helps your cough and shortness of breath  Order- neb xop 0.63     Dx Chronic Bronchitis, sarcoid              Depo 80  Please call if we can help

## 2018-07-07 NOTE — Assessment & Plan Note (Signed)
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

## 2018-07-07 NOTE — Assessment & Plan Note (Signed)
Mild nonspecific exacerbation for which she would like steroid injection and breathing treatment.  We discussed these.  We discussed alternatives to [facility] and use of LAMA meds with her mild glaucoma.  She did not want to try a sample and wishes to continue using Breo for now.

## 2018-07-11 ENCOUNTER — Encounter: Payer: Self-pay | Admitting: Podiatry

## 2018-07-11 ENCOUNTER — Ambulatory Visit: Payer: Medicare Other | Admitting: Podiatry

## 2018-07-11 DIAGNOSIS — L84 Corns and callosities: Secondary | ICD-10-CM | POA: Diagnosis not present

## 2018-07-11 DIAGNOSIS — B351 Tinea unguium: Secondary | ICD-10-CM | POA: Diagnosis not present

## 2018-07-11 DIAGNOSIS — M79676 Pain in unspecified toe(s): Secondary | ICD-10-CM | POA: Diagnosis not present

## 2018-07-14 NOTE — Progress Notes (Signed)
Subjective: 77 y.o. returns the office today for painful, elongated, thickened toenails which she cannot trim herself as well as for a painful corn on the left 5th toe. Denies any redness or drainage around the nails/callus. Denies any acute changes since last appointment and no new complaints today. Denies any systemic complaints such as fevers, chills, nausea, vomiting.   PCP: Prince Solian, MD  Objective: AAO 3, NAD DP/PT pulses palpable, CRT less than 3 seconds Nails hypertrophic, dystrophic, elongated, brittle, discolored 10. There is tenderness overlying the nails 1-5 bilaterally. There is no surrounding erythema or drainage along the nail sites. Hyperkeratotic lesion left dorsal lateral fifth toe at the PIPJ.  Upon debridement there is no underlying ulceration, drainage or any signs of infection present. No open lesions or pre-ulcerative lesions are identified. No pain with calf compression, swelling, warmth, erythema.  Assessment: Patient presents with symptomatic onychomycosis; hyperkeratotic lesion  Plan: -Treatment options including alternatives, risks, complications were discussed -Nails sharply debrided 10 without complication/bleeding. -Hyperkeratotic lesion was sharply debrided x1 without any complications or bleeding.  Continue offloading. -Discussed daily foot inspection. If there are any changes, to call the office immediately.  -Follow-up in 3 months or sooner if any problems are to arise. In the meantime, encouraged to call the office with any questions, concerns, changes symptoms.  Celesta Gentile, DPM

## 2018-07-19 ENCOUNTER — Other Ambulatory Visit: Payer: Self-pay | Admitting: Internal Medicine

## 2018-07-20 DIAGNOSIS — R918 Other nonspecific abnormal finding of lung field: Secondary | ICD-10-CM | POA: Insufficient documentation

## 2018-07-22 ENCOUNTER — Other Ambulatory Visit: Payer: Self-pay | Admitting: Internal Medicine

## 2018-07-22 DIAGNOSIS — R918 Other nonspecific abnormal finding of lung field: Secondary | ICD-10-CM

## 2018-07-22 DIAGNOSIS — J309 Allergic rhinitis, unspecified: Secondary | ICD-10-CM

## 2018-07-22 DIAGNOSIS — J449 Chronic obstructive pulmonary disease, unspecified: Secondary | ICD-10-CM

## 2018-08-01 ENCOUNTER — Other Ambulatory Visit: Payer: Self-pay | Admitting: Internal Medicine

## 2018-08-01 ENCOUNTER — Ambulatory Visit
Admission: RE | Admit: 2018-08-01 | Discharge: 2018-08-01 | Disposition: A | Discharge status: Home / Self Care | Payer: Medicare Other | Source: Ambulatory Visit | Attending: Internal Medicine | Admitting: Internal Medicine

## 2018-08-01 DIAGNOSIS — J449 Chronic obstructive pulmonary disease, unspecified: Secondary | ICD-10-CM

## 2018-08-01 DIAGNOSIS — J309 Allergic rhinitis, unspecified: Secondary | ICD-10-CM

## 2018-08-01 DIAGNOSIS — R918 Other nonspecific abnormal finding of lung field: Secondary | ICD-10-CM

## 2018-08-01 MED ORDER — IOPAMIDOL (ISOVUE-300) INJECTION 61%: 75.0000 mL | Freq: Once | INTRAVENOUS | Status: DC | PRN

## 2018-08-09 NOTE — Progress Notes (Signed)
Cardiology Office Note    Date:  08/11/2018   ID:  Danielle Peters, DOB Sep 09, 1941, MRN [id number]  PCP:  Prince Solian, MD  Cardiologist:   Sanda Klein, MD   Chief Complaint  Patient presents with  . Palpitations    History of Present Illness:  Danielle Peters is a 77 y.o. female with history of successful RFA for AVNRT, HTN, intermittent left bundle branch block, hypercholesterolemia, pulmonary sarcoidosis, essential tremor, history of left breast cancer with lumpectomy and chemotherapy presents in follow-up.  Saw Rhonda Barrett one year ago for chest pain (chest wall tenderness) and palpitations (rare, only needs metoprolol prn). Dr. Annamaria Boots follows for sarcoidosis and chronic bronchitis (last visit one month ago) and Dr. Krista Blue for essential tremor.  She is now taking primidone but still has quite a bit of tremor.  She cannot eat soup with a spoon, but can drink from a cup.  Recently she has had a protracted period with "bronchitis".  She has a productive cough but the sputum is thin and light-colored.  She has not had fever chills or pleurisy.  She has not required any metoprolol for palpitations in many months.  She has several bottles full at home as the pharmacy sends automatic refills.  Feb 2018 nuclear scan shows normal perfusion and normal left ventricle systolic function.  Normal echo Feb 2018.[name]  Recent labs showed no major electrolyte abnormalities. Her total cholesterol and LDL cholesterol remains severely elevated, but she has been intolerant of numerous statins. She does not have known coronary or peripheral vascular disease.  Past Medical History:  Diagnosis Date  . Anemia   . Anxiety   . Cancer ([employer])    breast ca  . Chest pain    echo 01/08/10- nl lv; mild aortic sclerosis; myoview 01/08/10- no ischemia  . Dyslipidemia    myaligia with statins  . Esophageal reflux   . Hypertension   . Personal history of chemotherapy 2004  . Personal history of radiation  therapy 2004  . Pulmonary sarcoidosis ([place])   . Shingles 03/2016  . Tremor    upper extremities  . Ventricular tachycardia (paroxysmal) ([facility])    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; AV node reentry tachycardia ablation     Past Surgical History:  Procedure Laterality Date  . Boil Right 03/2015   on buttock, excision  . BREAST LUMPECTOMY Right 2004  . left breast lumpectomy,chemo  2004   xrt  . SKIN BIOPSY    . UPPER GI ENDOSCOPY  02/2015   difficulty swallowing, Abd pain    Current Medications: Outpatient Medications Prior to Visit  Medication Sig Dispense Refill  . acetaminophen (TYLENOL) 325 MG tablet Take 650 mg by mouth every 6 (six) hours as needed for mild pain.    [name] aspirin 81 MG tablet Take 81 mg by mouth every other day.     . diazepam (VALIUM) 2 MG tablet Take 2 mg by mouth every 6 (six) hours as needed for anxiety.    . fluticasone (FLONASE) 50 MCG/ACT nasal spray SPRAY 2 SPRAYS INTO EACH NOSTRIL EVERY DAY  11  . fluticasone furoate-vilanterol (BREO ELLIPTA) 100-25 MCG/INH AEPB Inhale 1 puff into the lungs daily. 1 each 0  . Glycopyrrolate-Formoterol (BEVESPI AEROSPHERE) 9-4.8 MCG/ACT AERO Inhale 2 puffs into the lungs 2 (two) times daily. 1 Inhaler 0  . hydrocortisone valerate ointment (WESTCORT) 0.2 % Apply 1 application topically 2 (two) times daily. 45 g 0  . ondansetron (ZOFRAN) 4 MG tablet  Take 4 mg by mouth every 8 (eight) hours as needed for nausea.     . pantoprazole (PROTONIX) 40 MG tablet Take 40 mg by mouth daily.    . primidone (MYSOLINE) 50 MG tablet Take 1 tablet (50 mg total) by mouth 3 (three) times daily. 270 tablet 4  . traMADol (ULTRAM) 50 MG tablet Take 100 mg by mouth every 8 (eight) hours as needed. for pain  4  . valsartan-hydrochlorothiazide (DIOVAN-HCT) 80-12.5 MG per tablet Take 1 tablet by mouth daily.  4  . metoprolol tartrate (LOPRESSOR) 25 MG tablet Take 12.5 mg by mouth as needed.      No facility-administered medications prior to visit.       Allergies:   Dextromethorphan polistirex er; Primidone; Albuterol; Amoxicillin; Asa [aspirin]; Loratadine; Nortriptyline; and Simvastatin   Social History   Socioeconomic History  . Marital status: Divorced    Spouse name: Not on file  . Number of children: 2  . Years of education: 24  . Highest education level: Not on file  Occupational History  . 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 Financial resource strain: Not on file  . Food insecurity:    Worry: Not on file    Inability: Not on file  . Transportation needs:    Medical: Not on file    Non-medical: Not on file  Tobacco Use  . Smoking status: Never Smoker  . Smokeless tobacco: Never Used  Substance and Sexual Activity  . Alcohol use: No  . Drug use: No  . Sexual activity: Not on file  Lifestyle  . Physical activity:    Days per week: Not on file    Minutes per session: Not on file  . Stress: Not on file  Relationships  . Social connections:    Talks on phone: Not on file    Gets together: Not on file    Attends religious service: Not on file    Active member of club or organization: Not on file    Attends meetings of clubs or organizations: Not on file    Relationship status: Not on file  Other Topics Concern  . Not on file  Social History Narrative   Patient lives at home alone. Patient is retired.Pateint has high school education. And went to [employer].   Right handed.   Caffeine- one cup daily.     Family History:  The patient's family history includes Cancer in her mother; Heart attack in her sister; Heart failure (age of onset: 78) in her father.   ROS:   Please see the history of present illness.    ROS All other systems reviewed and are negative.   PHYSICAL EXAM:   VS:  BP 120/62 (BP Location: Right Arm, Patient Position: Sitting, Cuff Size: Normal)   Pulse 92   Ht 5\' 3"  (1.6 m)   Wt 135 lb (61.2 kg)   LMP  (Exact Date)   SpO2 98%   BMI 23.91 kg/m     General: Alert,  oriented x3, no distress, appears well Head: no evidence of trauma, PERRL, EOMI, no exophtalmos or lid lag, no myxedema, no xanthelasma; normal ears, nose and oropharynx Neck: normal jugular venous pulsations and no hepatojugular reflux; brisk carotid pulses without delay and no carotid bruits Chest: clear to auscultation, no signs of consolidation by percussion or palpation, normal fremitus, symmetrical and full respiratory excursions Cardiovascular: normal position and quality of the apical impulse, regular rhythm, normal first and second  heart sounds, no murmurs, rubs or gallops Abdomen: no tenderness or distention, no masses by palpation, no abnormal pulsatility or arterial bruits, normal bowel sounds, no hepatosplenomegaly Extremities: no clubbing, cyanosis or edema; 2+ radial, ulnar and brachial pulses bilaterally; 2+ right femoral, posterior tibial and dorsalis pedis pulses; 2+ left femoral, posterior tibial and dorsalis pedis pulses; no subclavian or femoral bruits Neurological: grossly nonfocal except resting tremor Psych: Normal mood and affect   Wt Readings from Last 3 Encounters:  08/11/18 135 lb (61.2 kg)  07/06/18 134 lb 12.8 oz (61.1 kg)  05/31/18 134 lb (60.8 kg)      Studies/Labs Reviewed:   EKG:  EKG is ordered today.  Today's tracing shows normal sinus rhythm, right atrial abnormality, left axis deviation left bundle branch block.  QRS measures 144 ms, QTC 477 ms.     Recent Labs:  Lipid Panel 02/20/2016 total cholesterol 257, HDL 65, LDL 176, triglycerides 80, glucose 102, creatinine 0.93, TSH 0.99 Lipid Panel  No results found for: CHOL, TRIG, HDL, CHOLHDL, VLDL, LDLCALC, LDLDIRECT BMET    Component Value Date/Time   NA 139 11/16/2016 1001   K 4.2 11/16/2016 1001   CL 99 11/16/2016 1001   CO2 28 11/16/2016 1001   GLUCOSE 102 (H) 11/16/2016 1001   BUN 15 11/16/2016 1001   CREATININE 0.93 11/16/2016 1001   CALCIUM 10.5 (H) 11/16/2016 1001   GFRNONAA 45 (L)  01/02/2016 1058   GFRAA 52 (L) 01/02/2016 1058   ASSESSMENT:    1. Palpitations   2. LBBB (left bundle branch block)   3. Paroxysmal supraventricular tachycardia ([place])   4. Hypercholesterolemia   5. Essential hypertension     PLAN:  In order of problems listed above:  1. Palpitations: Very rarely bother her.  We will stop the metoprolol. 2. LBBB: Based on previous ECGs this is probably greatly related: no evidence of high-grade AV block clinically, no evidence of underlying structural heart disease by echo and nuclear stress test.  Pacemaker is not indicated. 3. SVT: No recurrence following AV node modification. 4. HLP: She does not have known coronary vascular disease and prefers not to take statins due to numerous side effects. 5. HTN: Well-controlled    Medication Adjustments/Labs and Tests Ordered: Current medicines are reviewed at length with the patient today.  Concerns regarding medicines are outlined above.  Medication changes, Labs and Tests ordered today are listed in the Patient Instructions below. Patient Instructions  Medication Instructions:  Dr Sallyanne Kuster has recommended making the following medication changes: 1. STOP Metoprolol  If you need a refill on your cardiac medications before your next appointment, please call your pharmacy.   Follow-Up: At [facility], you and your health needs are our priority.  As part of our continuing mission to provide you with exceptional heart care, we have created designated Provider Care Teams.  These Care Teams include your primary Cardiologist (physician) and Advanced Practice Providers (APPs -  Physician Assistants and Nurse Practitioners) who all work together to provide you with the care you need, when you need it. You will need a follow up appointment in 12 months.  Please call our office 2 months in advance to schedule this appointment.  You may see Sanda Klein, MD or one of the following Advanced Practice Providers on  your designated Care Team: [place], [place] . Fabian Sharp, PA-C    Signed, Sanda Klein, MD  08/11/2018 6:39 PM    [address],  [place], [place]  [address] Phone: [phone]; Fax: [phone]

## 2018-08-11 ENCOUNTER — Encounter: Payer: Self-pay | Admitting: Cardiovascular Disease

## 2018-08-11 ENCOUNTER — Ambulatory Visit: Payer: Medicare Other | Admitting: Cardiovascular Disease

## 2018-08-11 VITALS — BP 120/62 | HR 92 | Ht 63.0 in | Wt 135.0 lb

## 2018-08-11 DIAGNOSIS — I10 Essential (primary) hypertension: Secondary | ICD-10-CM

## 2018-08-11 DIAGNOSIS — E7800 Pure hypercholesterolemia, unspecified: Secondary | ICD-10-CM

## 2018-08-11 DIAGNOSIS — I447 Left bundle-branch block, unspecified: Secondary | ICD-10-CM | POA: Diagnosis not present

## 2018-08-11 DIAGNOSIS — R002 Palpitations: Secondary | ICD-10-CM | POA: Diagnosis not present

## 2018-08-11 DIAGNOSIS — I471 Supraventricular tachycardia: Secondary | ICD-10-CM

## 2018-08-11 NOTE — Patient Instructions (Signed)
Medication Instructions:  Dr Sallyanne Kuster has recommended making the following medication changes: 1. STOP Metoprolol  If you need a refill on your cardiac medications before your next appointment, please call your pharmacy.   Follow-Up: At [facility], you and your health needs are our priority.  As part of our continuing mission to provide you with exceptional heart care, we have created designated Provider Care Teams.  These Care Teams include your primary Cardiologist (physician) and Advanced Practice Providers (APPs -  Physician Assistants and Nurse Practitioners) who all work together to provide you with the care you need, when you need it. You will need a follow up appointment in 12 months.  Please call our office 2 months in advance to schedule this appointment.  You may see Sanda Klein, MD or one of the following Advanced Practice Providers on your designated Care Team: [place], [place] . Fabian Sharp, PA-C

## 2018-09-06 ENCOUNTER — Telehealth: Payer: Self-pay | Admitting: Internal Medicine

## 2018-09-06 MED ORDER — AZITHROMYCIN 250 MG PO TABS: ORAL_TABLET | ORAL | 0 refills | Status: DC

## 2018-09-06 NOTE — Telephone Encounter (Signed)
Offer Zpak    250 mg, # 6, 2 today then one daily 

## 2018-09-06 NOTE — Telephone Encounter (Signed)
Spoke with pt, she is having pain in both ears, coughing, and headache. She has thick white mucus coming up when she coughs. She denies fever but does have a sore throat and body aches. She stated Valetta Fuller, NP at [facility] said she had fluid around her ear and was prescribed amoxicillin but now she feels it's not getting better.  [facility]    Current Outpatient Medications on File Prior to Visit  Medication Sig Dispense Refill  . acetaminophen (TYLENOL) 325 MG tablet Take 650 mg by mouth every 6 (six) hours as needed for mild pain.    [name] aspirin 81 MG tablet Take 81 mg by mouth every other day.     . diazepam (VALIUM) 2 MG tablet Take 2 mg by mouth every 6 (six) hours as needed for anxiety.    . fluticasone (FLONASE) 50 MCG/ACT nasal spray SPRAY 2 SPRAYS INTO EACH NOSTRIL EVERY DAY  11  . fluticasone furoate-vilanterol (BREO ELLIPTA) 100-25 MCG/INH AEPB Inhale 1 puff into the lungs daily. 1 each 0  . Glycopyrrolate-Formoterol (BEVESPI AEROSPHERE) 9-4.8 MCG/ACT AERO Inhale 2 puffs into the lungs 2 (two) times daily. 1 Inhaler 0  . hydrocortisone valerate ointment (WESTCORT) 0.2 % Apply 1 application topically 2 (two) times daily. 45 g 0  . ondansetron (ZOFRAN) 4 MG tablet Take 4 mg by mouth every 8 (eight) hours as needed for nausea.     . pantoprazole (PROTONIX) 40 MG tablet Take 40 mg by mouth daily.    . primidone (MYSOLINE) 50 MG tablet Take 1 tablet (50 mg total) by mouth 3 (three) times daily. 270 tablet 4  . traMADol (ULTRAM) 50 MG tablet Take 100 mg by mouth every 8 (eight) hours as needed. for pain  4  . valsartan-hydrochlorothiazide (DIOVAN-HCT) 80-12.5 MG per tablet Take 1 tablet by mouth daily.  4   No current facility-administered medications on file prior to visit.    Allergies  Allergen Reactions  . Dextromethorphan Polistirex Er Other (See Comments)    SOB  . Primidone     Severe Headaches  . Albuterol     REACTION: tachycardia  . Amoxicillin Diarrhea and  Nausea Only  . Asa [Aspirin]     Adult dose  . Loratadine   . Nortriptyline     Increase heart rate.  . Simvastatin     Myalgias

## 2018-09-06 NOTE — Telephone Encounter (Signed)
Called and spoke with patient, she is aware of response and verbalized understanding. Medication sent in. Nothing further needed.

## 2018-09-21 DIAGNOSIS — H6123 Impacted cerumen, bilateral: Secondary | ICD-10-CM | POA: Insufficient documentation

## 2018-09-21 DIAGNOSIS — H9193 Unspecified hearing loss, bilateral: Secondary | ICD-10-CM | POA: Insufficient documentation

## 2018-09-30 ENCOUNTER — Other Ambulatory Visit: Payer: Self-pay | Admitting: Podiatry

## 2018-09-30 ENCOUNTER — Ambulatory Visit (INDEPENDENT_AMBULATORY_CARE_PROVIDER_SITE_OTHER): Payer: Medicare Other

## 2018-09-30 ENCOUNTER — Ambulatory Visit: Payer: Medicare Other | Admitting: Podiatry

## 2018-09-30 DIAGNOSIS — M7751 Other enthesopathy of right foot: Secondary | ICD-10-CM

## 2018-09-30 DIAGNOSIS — B351 Tinea unguium: Secondary | ICD-10-CM

## 2018-09-30 DIAGNOSIS — M79671 Pain in right foot: Secondary | ICD-10-CM

## 2018-09-30 DIAGNOSIS — M79672 Pain in left foot: Secondary | ICD-10-CM

## 2018-09-30 DIAGNOSIS — L84 Corns and callosities: Secondary | ICD-10-CM

## 2018-09-30 DIAGNOSIS — M7752 Other enthesopathy of left foot: Secondary | ICD-10-CM

## 2018-09-30 DIAGNOSIS — M79676 Pain in unspecified toe(s): Secondary | ICD-10-CM | POA: Diagnosis not present

## 2018-09-30 DIAGNOSIS — M779 Enthesopathy, unspecified: Secondary | ICD-10-CM

## 2018-09-30 MED ORDER — DICLOFENAC SODIUM 1 % TD GEL: 2.0000 g | Freq: Four times a day (QID) | TRANSDERMAL | 2 refills | Status: DC

## 2018-10-04 NOTE — Progress Notes (Signed)
Subjective: 77 year old female presents the office today for concerns of bilateral foot pain which is been ongoing for the last month and she describes an aching sensation.  She gets pain to bilateral fifth toes and specifically along the right side she is getting more pain along the posterior aspect the heel and she points on the Achilles tendon.  She has gone to the emergency room for this and she is also been seen other doctors she is tried different moisturizers but any significant improvement.  In urgent care per the chart she was given hydrocortisone cream.  She states this was not helpful.  She denies any recent injury or trauma.  Denies any systemic complaints such as fevers, chills, nausea, vomiting. No acute changes since last appointment, and no other complaints at this time.   Objective: AAO x3, NAD DP/PT pulses palpable bilaterally, CRT less than 3 seconds Nails are hypertrophic, dystrophic, brittle, discolored, elongated 10. No surrounding redness or drainage. Tenderness nails 1-5 bilaterally. No open lesions or pre-ulcerative lesions are identified today. Hyperkeratotic lesions present the left and right fifth toes and big toes.  Also to the posterior left heel.  Upon debridement there is no underlying ulceration, drainage or any signs of infection. There is mild tenderness to palpation on the course of the Achilles tendon on the right > left side but overall Thompson test is negative the Achilles tendon appears to be intact.  There is no pain with lateral compression of calcaneus.  Similar symptoms in the right side but no significant.  Also mild discomfort to the plantar aspect of the right heel. No open lesions or pre-ulcerative lesions.  No pain with calf compression, swelling, warmth, erythema no evidence of DVT.  Assessment: 77 year old female with symptomatic onychomycosis, hyperkeratotic lesions, bilateral foot pain/Achilles tendonitis left side  Plan: -All treatment options  discussed with the patient including all alternatives, risks, complications.  -X-rays were obtained and reviewed.  No evidence of acute fracture or stress fracture identified today. -I think she is having a couple different issues causing her discomfort.  I do think is an Achilles tendinitis.  Due to that we discussed Voltaren gel which I ordered today.  We also discussed physical therapy.  Discussed stretching, icing exercises as well at home.  Discussed ankle brace on the left side. -Also some of the heel pain can account for the hyperkeratotic lesion.  Sharp debrided all the hyperkeratotic lesions without any complications or bleeding.  She can use moisturizer on those areas. -No strep debrided x10 without any complications or bleeding. -Patient encouraged to call the office with any questions, concerns, change in symptoms.   Return in about 3 months (around 12/30/2018).  However if she is continuing to have pain over the next 4 to 6 weeks to let me know when she agrees with this.  Trula Slade DPM

## 2018-10-07 ENCOUNTER — Telehealth: Payer: Self-pay | Admitting: Podiatry

## 2018-10-07 NOTE — Telephone Encounter (Signed)
Is she using the voltaren gel on the callus? A lot of her pain was along the achilles tendons and I wanted her to use that cream on the tendonitis and then use a moisturizer on the callus area. We can do a urea cream on the callus area.

## 2018-10-07 NOTE — Telephone Encounter (Signed)
Pt was seen last week and the mediation prescribed does not seem to be helping with pain. Pt would like to know I there anything else she do to help with the pain or if there is another mediation she can use.  Please give pt a call.

## 2018-10-07 NOTE — Telephone Encounter (Signed)
I called pt and informed Dr. Jacqualyn Posey stated she could use Aspercreme with Lidocaine to the achilles and ankle, and neosporin and lidocaine to the callous areas. Pt states the little toe is swollen where shaved and I told her to use the neosporin and lidocaine there and let us know how she was on Monday. Pt states okay.

## 2018-10-07 NOTE — Telephone Encounter (Signed)
I called pt and she states she is having trouble with placing the straps on the brace, and the sodium tropical gel is not helping. Pt states she is not able to wear the brace, because she can't get it on her foot and the callous areas hurt in every shoe. I asked pt if I could help instruct her on the brace application and she states she is out taking a cousin shopping. Pt requested a different cream. I told pt she could also add ice therapy, ice the painful areas 3-4 times daily for 15-20 minutes/session and protect the skin from the ice with a light towel.

## 2018-10-10 ENCOUNTER — Ambulatory Visit: Payer: Medicare Other | Admitting: Podiatry

## 2018-10-14 ENCOUNTER — Other Ambulatory Visit: Payer: Self-pay | Admitting: Internal Medicine

## 2018-10-14 MED ORDER — FLUTICASONE FUROATE-VILANTEROL 100-25 MCG/INH IN AEPB: 1.0000 | INHALATION_SPRAY | Freq: Every day | RESPIRATORY_TRACT | 0 refills | Status: DC

## 2018-11-25 ENCOUNTER — Ambulatory Visit: Payer: Medicare Other | Admitting: Cardiology

## 2018-11-28 ENCOUNTER — Encounter: Payer: Self-pay | Admitting: Cardiology

## 2018-11-28 ENCOUNTER — Ambulatory Visit: Payer: Medicare Other | Admitting: Cardiology

## 2018-11-28 VITALS — BP 140/62 | HR 82 | Ht 64.25 in | Wt 135.2 lb

## 2018-11-28 DIAGNOSIS — D869 Sarcoidosis, unspecified: Secondary | ICD-10-CM

## 2018-11-28 DIAGNOSIS — I471 Supraventricular tachycardia: Secondary | ICD-10-CM

## 2018-11-28 DIAGNOSIS — I10 Essential (primary) hypertension: Secondary | ICD-10-CM | POA: Diagnosis not present

## 2018-11-28 DIAGNOSIS — R079 Chest pain, unspecified: Secondary | ICD-10-CM | POA: Diagnosis not present

## 2018-11-28 DIAGNOSIS — R0789 Other chest pain: Secondary | ICD-10-CM

## 2018-11-28 DIAGNOSIS — I447 Left bundle-branch block, unspecified: Secondary | ICD-10-CM

## 2018-11-28 DIAGNOSIS — G250 Essential tremor: Secondary | ICD-10-CM

## 2018-11-28 MED ORDER — METOPROLOL TARTRATE 25 MG PO TABS: 12.5000 mg | ORAL_TABLET | Freq: Two times a day (BID) | ORAL | 3 refills | Status: DC

## 2018-11-28 NOTE — Progress Notes (Signed)
Thank you. Agree. MCr

## 2018-11-28 NOTE — Assessment & Plan Note (Signed)
Remote RFA- no complaints of palpitations today

## 2018-11-28 NOTE — Patient Instructions (Signed)
Medication Instructions:  Your physician recommends that you continue on your current medications as directed. Please refer to the Current Medication list given to you today.  If you need a refill on your cardiac medications before your next appointment, please call your pharmacy.   Lab work: NONE If you have labs (blood work) drawn today and your tests are completely normal, you will receive your results only by: [name] MyChart Message (if you have MyChart) OR . A paper copy in the mail If you have any lab test that is abnormal or we need to change your treatment, we will call you to review the results.  Testing/Procedures: Your physician has requested that you have a lexiscan myoview. For further information please visit [email or web address]. Please follow instruction sheet, as given.   Follow-Up: At [facility], you and your health needs are our priority.  As part of our continuing mission to provide you with exceptional heart care, we have created designated Provider Care Teams.  These Care Teams include your primary Cardiologist (physician) and Advanced Practice Providers (APPs -  Physician Assistants and Nurse Practitioners) who all work together to provide you with the care you need, when you need it. You will need a follow up appointment in April 2020 with Dr. Sallyanne Kuster.

## 2018-11-28 NOTE — Progress Notes (Signed)
11/28/2018 [place]   Mar 22, 1941  [id number]  Primary Physician Prince Solian, MD Primary Cardiologist: Dr Sallyanne Kuster  HPI: The patient is a pleasant 78 year old female with a history of prior RFA for AVNRT, hypertension, PVCs, pulmonary sarcoidosis, and essential tremor.  She had a low risk Myoview in February 2018 and a normal echocardiogram in February 2018.  She seen in the office today as an add-on for chest pain.  She describes localized left chest and rib pain for the past 2 or 3 months.  Although it is localized she says it is not tender to palpation and is worse with exertion, i.e. carrying groceries in.  She denies any associated shortness of breath radiation to her jaw or arms.  She has taken some Aleve with partial relief.  She denies any hemoptysis.   Current Outpatient Medications  Medication Sig Dispense Refill  . aspirin 81 MG tablet Take 81 mg by mouth every other day.     . betamethasone dipropionate (DIPROLENE) 0.05 % cream Apply 1 application topically 2 (two) times daily.    . diazepam (VALIUM) 2 MG tablet Take 2 mg by mouth every 6 (six) hours as needed for anxiety.    . diclofenac sodium (VOLTAREN) 1 % GEL Apply 2 g topically 4 (four) times daily. Rub into affected area of foot 2 to 4 times daily 100 g 2  . fluticasone (FLONASE) 50 MCG/ACT nasal spray SPRAY 2 SPRAYS INTO EACH NOSTRIL EVERY DAY  11  . fluticasone furoate-vilanterol (BREO ELLIPTA) 100-25 MCG/INH AEPB Inhale 1 puff into the lungs daily. 1 each 0  . metoprolol tartrate (LOPRESSOR) 25 MG tablet Take 0.5 tablets (12.5 mg total) by mouth 2 (two) times daily. 90 tablet 3  . omeprazole (PRILOSEC) 40 MG capsule Take 1 capsule by mouth daily as needed.    . ondansetron (ZOFRAN) 4 MG tablet Take 4 mg by mouth every 8 (eight) hours as needed for nausea.     . primidone (MYSOLINE) 50 MG tablet Take 1 tablet (50 mg total) by mouth 3 (three) times daily. 270 tablet 4  . traMADol (ULTRAM) 50 MG tablet Take  100 mg by mouth every 8 (eight) hours as needed. for pain  4  . valsartan-hydrochlorothiazide (DIOVAN-HCT) 80-12.5 MG per tablet Take 1 tablet by mouth daily.  4   No current facility-administered medications for this visit.     Allergies  Allergen Reactions  . Dextromethorphan Polistirex Er Other (See Comments)    SOB  . Primidone     Severe Headaches  . Albuterol     REACTION: tachycardia  . Amoxicillin Diarrhea and Nausea Only  . Asa [Aspirin]     Adult dose  . Loratadine   . Nortriptyline     Increase heart rate.  . Simvastatin     Myalgias     Past Medical History:  Diagnosis Date  . Anemia   . Anxiety   . Cancer ([place])    breast ca  . Chest pain    echo 01/08/10- nl lv; mild aortic sclerosis; myoview 01/08/10- no ischemia  . Dyslipidemia    myaligia with statins  . Esophageal reflux   . Hypertension   . Personal history of chemotherapy 2004  . Personal history of radiation therapy 2004  . Pulmonary sarcoidosis ([place])   . Shingles 03/2016  . Tremor    upper extremities  . Ventricular tachycardia (paroxysmal) (HCC)    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; AV node reentry tachycardia ablation  Social History   Socioeconomic History  . Marital status: Divorced    Spouse name: Not on file  . Number of children: 2  . Years of education: 65  . Highest education level: Not on file  Occupational History  . 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 Financial resource strain: Not on file  . Food insecurity:    Worry: Not on file    Inability: Not on file  . Transportation needs:    Medical: Not on file    Non-medical: Not on file  Tobacco Use  . Smoking status: Never Smoker  . Smokeless tobacco: Never Used  Substance and Sexual Activity  . Alcohol use: No  . Drug use: No  . Sexual activity: Not on file  Lifestyle  . Physical activity:    Days per week: Not on file    Minutes per session: Not on file  . Stress: Not on file  Relationships  .  Social connections:    Talks on phone: Not on file    Gets together: Not on file    Attends religious service: Not on file    Active member of club or organization: Not on file    Attends meetings of clubs or organizations: Not on file    Relationship status: Not on file  . Intimate partner violence:    Fear of current or ex partner: Not on file    Emotionally abused: Not on file    Physically abused: Not on file    Forced sexual activity: Not on file  Other Topics Concern  . Not on file  Social History Narrative   Patient lives at home alone. Patient is retired.Pateint has high school education. And went to [employer].   Right handed.   Caffeine- one cup daily.     Family History  Problem Relation Age of Onset  . Heart failure Father 65  . Cancer Mother   . Heart attack Sister        2 heart attacks  . Breast cancer Neg Hx      Review of Systems: General: negative for chills, fever, night sweats or weight changes.  Cardiovascular: negative for dyspnea on exertion, edema, orthopnea, palpitations, paroxysmal nocturnal dyspnea or shortness of breath Dermatological: negative for rash Respiratory: negative for cough or wheezing Urologic: negative for hematuria Abdominal: negative for nausea, vomiting, diarrhea, bright red blood per rectum, melena, or hematemesis Neurologic: negative for visual changes, syncope, or dizziness All other systems reviewed and are otherwise negative except as noted above.    Blood pressure 140/62, pulse 82, height 5' 4.25" (1.632 m), weight 135 lb 3.2 oz (61.3 kg).  General appearance: alert, cooperative, appears older than stated age and no distress Neck: no carotid bruit and no JVD Lungs: kyphosis, scatter rhonchi Heart: regular rate and rhythm Extremities: no edema Skin: warm and dry Neurologic: Grossly normal, resting tremor noted  EKG NSR, PVC, LBBB  ASSESSMENT AND PLAN:   Chest pain, atypical Pt seen for chest pain, some  typical, some atypica features  Paroxysmal supraventricular tachycardia (HCC) Remote RFA- no complaints of palpitations today  Essential hypertension Controlled  LBBB (left bundle branch block) Previously thought to be intermittent, now looks to be permanent   PULMONARY SARCOIDOSIS Followed by Dr Jacqlyn Krauss  Lexiscan Myoview- if negative refer to her PCP for further evaluation.   Kerin Ransom PA-C 11/28/2018 12:41 PM

## 2018-11-28 NOTE — Assessment & Plan Note (Signed)
Previously thought to be intermittent, now looks to be permanent

## 2018-11-28 NOTE — Assessment & Plan Note (Signed)
Pt seen for chest pain, some typical, some atypica features

## 2018-11-28 NOTE — Assessment & Plan Note (Signed)
Controlled.  

## 2018-11-28 NOTE — Assessment & Plan Note (Signed)
Followed by Dr. Young. 

## 2018-12-05 ENCOUNTER — Encounter: Payer: Self-pay | Admitting: Podiatry

## 2018-12-05 ENCOUNTER — Ambulatory Visit: Payer: Medicare Other | Admitting: Podiatry

## 2018-12-05 DIAGNOSIS — M79676 Pain in unspecified toe(s): Secondary | ICD-10-CM | POA: Diagnosis not present

## 2018-12-05 DIAGNOSIS — B351 Tinea unguium: Secondary | ICD-10-CM | POA: Diagnosis not present

## 2018-12-05 DIAGNOSIS — L84 Corns and callosities: Secondary | ICD-10-CM

## 2018-12-05 DIAGNOSIS — M779 Enthesopathy, unspecified: Secondary | ICD-10-CM

## 2018-12-06 ENCOUNTER — Telehealth (HOSPITAL_COMMUNITY): Payer: Self-pay | Admitting: *Deleted

## 2018-12-06 NOTE — Telephone Encounter (Signed)
Close encounter 

## 2018-12-07 ENCOUNTER — Encounter (HOSPITAL_COMMUNITY): Payer: Medicare Other

## 2018-12-08 ENCOUNTER — Ambulatory Visit (HOSPITAL_COMMUNITY)
Admission: RE | Admit: 2018-12-08 | Payer: Medicare Other | Source: Ambulatory Visit | Attending: Cardiology | Admitting: Cardiology

## 2018-12-14 ENCOUNTER — Telehealth (HOSPITAL_COMMUNITY): Payer: Self-pay

## 2018-12-14 NOTE — Telephone Encounter (Signed)
Encounter complete. 

## 2018-12-14 NOTE — Progress Notes (Signed)
Subjective: 78 year old female presents the office today for follow-up evaluation and continued pain to both of her heels and callus sites and she is also asked her nails to be trimmed.  Have not seen her for some time.  Mild trim the calluses it feels better.  Upon evaluation with her last appointment she was frustrated because she not getting any relief and she was having more Achilles tendon pain and pain in other areas.  We did treatment for other issues but this was not helpful she does not understand why we would do that treatment so therefore she did not use the Voltaren gel she did was not using the ankle brace that she was not do any other treatment we talked about.  She do not feel that stretching was to be helpful.  She denies any fevers, chills, nausea, vomiting.  No calf pain, chest pain, shortness of breath. Objective: AAO x3, NAD DP/PT pulses palpable bilaterally, CRT less than 3 seconds Nails are hypertrophic, dystrophic, brittle, discolored, elongated 10. No surrounding redness or drainage. Tenderness nails 1-5 bilaterally. No open lesions or pre-ulcerative lesions are identified today. Hyperkeratotic lesions present the left and right fifth toes and big toes.  Also to the posterior left and right heel.  Upon debridement there is no underlying ulceration drainage or any signs of infection. Majority tenderness appears to be to the callus sites with left side worse than right.  Upon palpation of there is other areas of tenderness to the posterior heel and along the Achilles tendon although more mild.  There is no edema, erythema.  Flexor, extensor tendons appear to be intact.  No open lesions or pre-ulcerative lesions.  No pain with calf compression, swelling, warmth, erythema no evidence of DVT.  Assessment: 78 year old female with symptomatic onychomycosis, hyperkeratotic lesions; still concern for tendinitis and other issues as well  Plan: -All treatment options discussed with the  patient including all alternatives, risks, complications.  -That should debrided the hyperkeratotic lesions without any complications or bleeding.  I also sharply debrided the nails x10 without any complications or bleeding. -She is frustrated that she still having discomfort.  Discussed with her that we trim the calluses we been working on that.  Last appointment she had other concerns and we worked on treating that but she did not do the treatment options.  I had a long discussion with her regards to her treatment options and she understood this after we talked further today.  Were to go back to hold off on the physical therapy but did discuss home stretching exercises in general will be helpful.  Offloading pads were dispensed.  Dispensed gel heel cups.  I want her to use urea cream on the callus sites as well.  Also we discussed more frequent debridements.  She is in agreement with this and she has no further questions or concerns.  Return in about 2 months (around 02/05/2019).  Trula Slade DPM

## 2018-12-16 ENCOUNTER — Encounter (HOSPITAL_COMMUNITY): Payer: Medicare Other

## 2018-12-22 ENCOUNTER — Telehealth (HOSPITAL_COMMUNITY): Payer: Self-pay

## 2018-12-22 NOTE — Telephone Encounter (Signed)
Encounter complete. 

## 2018-12-27 ENCOUNTER — Ambulatory Visit (HOSPITAL_COMMUNITY)
Admission: RE | Admit: 2018-12-27 | Discharge: 2018-12-27 | Disposition: A | Discharge status: Home / Self Care | Payer: Medicare Other | Source: Ambulatory Visit | Attending: Cardiology | Admitting: Cardiology

## 2018-12-27 DIAGNOSIS — R079 Chest pain, unspecified: Secondary | ICD-10-CM | POA: Diagnosis present

## 2018-12-27 LAB — MYOCARDIAL PERFUSION IMAGING
LV dias vol: 48 mL (ref 46–106)
LV sys vol: 8 mL
Peak HR: 131 {beats}/min
Rest HR: 92 {beats}/min
SDS: 1
SRS: 6
SSS: 7
TID: 1.19

## 2018-12-27 MED ORDER — TECHNETIUM TC 99M TETROFOSMIN IV KIT
10.2000 | PACK | Freq: Once | INTRAVENOUS | Status: AC | PRN
  Administered 2018-12-27: 10.2 via INTRAVENOUS
  Filled 2018-12-27: qty 11

## 2018-12-27 MED ORDER — AMINOPHYLLINE 25 MG/ML IV SOLN
75.0000 mg | Freq: Once | INTRAVENOUS | Status: AC
  Administered 2018-12-27: 75 mg via INTRAVENOUS

## 2018-12-27 MED ORDER — TECHNETIUM TC 99M TETROFOSMIN IV KIT
31.9000 | PACK | Freq: Once | INTRAVENOUS | Status: AC | PRN
  Administered 2018-12-27: 31.9 via INTRAVENOUS
  Filled 2018-12-27: qty 32

## 2018-12-27 MED ORDER — REGADENOSON 0.4 MG/5ML IV SOLN
0.4000 mg | Freq: Once | INTRAVENOUS | Status: AC
  Administered 2018-12-27: 0.4 mg via INTRAVENOUS

## 2018-12-30 ENCOUNTER — Ambulatory Visit: Payer: Medicare Other | Admitting: Podiatry

## 2019-01-04 ENCOUNTER — Ambulatory Visit: Payer: Medicare Other | Admitting: Internal Medicine

## 2019-01-24 ENCOUNTER — Telehealth: Payer: Self-pay | Admitting: Cardiovascular Disease

## 2019-01-24 NOTE — Telephone Encounter (Signed)
New Message   Patient calling in about appointment would like a nurse to call about the virtual appointments.

## 2019-01-24 NOTE — Telephone Encounter (Signed)
Spoke with patient, she was told to follow up with Dr.C on Friday- I moved appointment to a virtual visit- telephone visit as patient does not have access to a video.   Will route to Dr.C to make him aware.

## 2019-01-25 ENCOUNTER — Telehealth: Payer: Self-pay | Admitting: Internal Medicine

## 2019-01-25 MED ORDER — FLUTICASONE FUROATE-VILANTEROL 100-25 MCG/INH IN AEPB: 1.0000 | INHALATION_SPRAY | Freq: Every day | RESPIRATORY_TRACT | 0 refills | Status: DC

## 2019-01-25 NOTE — Telephone Encounter (Signed)
rx sent for Breo with no refills as pt has 5/5 appt with Dr. Annamaria Boots. Pt aware Nothing further needed.

## 2019-01-27 ENCOUNTER — Telehealth (INDEPENDENT_AMBULATORY_CARE_PROVIDER_SITE_OTHER): Payer: Medicare Other | Admitting: Cardiovascular Disease

## 2019-01-27 ENCOUNTER — Encounter: Payer: Self-pay | Admitting: Cardiovascular Disease

## 2019-01-27 ENCOUNTER — Ambulatory Visit: Payer: Medicare Other | Admitting: Cardiovascular Disease

## 2019-01-27 DIAGNOSIS — I10 Essential (primary) hypertension: Secondary | ICD-10-CM

## 2019-01-27 DIAGNOSIS — R002 Palpitations: Secondary | ICD-10-CM

## 2019-01-27 DIAGNOSIS — D869 Sarcoidosis, unspecified: Secondary | ICD-10-CM

## 2019-01-27 DIAGNOSIS — E7800 Pure hypercholesterolemia, unspecified: Secondary | ICD-10-CM | POA: Diagnosis not present

## 2019-01-27 DIAGNOSIS — G250 Essential tremor: Secondary | ICD-10-CM | POA: Diagnosis not present

## 2019-01-27 DIAGNOSIS — R0789 Other chest pain: Secondary | ICD-10-CM | POA: Diagnosis not present

## 2019-01-27 DIAGNOSIS — I447 Left bundle-branch block, unspecified: Secondary | ICD-10-CM

## 2019-01-27 MED ORDER — PRAVASTATIN SODIUM 40 MG PO TABS: 40.0000 mg | ORAL_TABLET | Freq: Every evening | ORAL | 1 refills | Status: DC

## 2019-01-27 NOTE — Progress Notes (Signed)
Virtual Visit via Telephone Note   This visit type was conducted due to national recommendations for restrictions regarding the COVID-19 Pandemic (e.g. social distancing) in an effort to limit this patient's exposure and mitigate transmission in our community.  Due to her co-morbid illnesses, this patient is at least at moderate risk for complications without adequate follow up.  This format is felt to be most appropriate for this patient at this time.  The patient did not have access to video technology/had technical difficulties with video requiring transitioning to audio format only (telephone).  All issues noted in this document were discussed and addressed.  No physical exam could be performed with this format.  Please refer to the patient's chart for her  consent to telehealth for [facility].   Evaluation Performed:  Follow-up visit  Date:  01/27/2019   ID:  [place], DOB May 08, 1941, MRN [id number]  Patient Location: Home  Provider Location: Home  PCP:  Prince Solian, MD  Cardiologist:  Sanda Klein, MD  Electrophysiologist:  None   Chief Complaint:  HTN, fatigue; follow up stress test  History of Present Illness:    Danielle Peters is a 78 y.o. female who presents via audio/video conferencing for a telehealth visit today.    Mrs. 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  She has not had palpitations in a very long time and believes that the metoprolol makes her feel tired and sleepy.  She has severe hypercholesterolemia but was intolerant of rosuvastatin and simvastatin in the past, due to myalgia.  She does not have known vascular or coronary disease.  She had some problems with chest discomfort earlier in the year and underwent a pharmacological stress test (she has underlying  left bundle branch block).  The nuclear study showed normal perfusion pattern and normal left ventricular ejection fraction.  Her chest pain symptoms are worse when she lies down and she has clear-cut symptoms of reflux.  I suspect the chest pain is related to that as well.  Her daughter has been helping her get groceries.  She is staying in the house except to go out to throw her trash in the dumpster.  The patient does not have symptoms concerning for COVID-19 infection (fever, chills, cough, or new shortness of breath).    Past Medical History:  Diagnosis Date   Anemia    Anxiety    Cancer ([place])    breast ca   Chest pain    echo 01/08/10- nl lv; mild aortic sclerosis; myoview 01/08/10- no ischemia   Dyslipidemia    myaligia with statins   Esophageal reflux    Hypertension    Personal history of chemotherapy 2004   Personal history of radiation therapy 2004   Pulmonary sarcoidosis ([place])    Shingles 03/2016   Tremor    upper extremities   Ventricular tachycardia (paroxysmal) ([place])    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; AV node reentry tachycardia ablation    Past Surgical History:  Procedure Laterality Date   Boil Right 03/2015   on buttock, excision   BREAST LUMPECTOMY Right 2004   left breast lumpectomy,chemo  2004   xrt   SKIN BIOPSY     UPPER GI ENDOSCOPY  02/2015   difficulty swallowing, Abd pain     Current Meds  Medication Sig   aspirin 81 MG tablet Take 81 mg by mouth  every other day.    betamethasone dipropionate (DIPROLENE) 0.05 % cream Apply 1 application topically 2 (two) times daily.   diazepam (VALIUM) 2 MG tablet Take 2 mg by mouth every 6 (six) hours as needed for anxiety.   diclofenac sodium (VOLTAREN) 1 % GEL Apply 2 g topically 4 (four) times daily. Rub into affected area of foot 2 to 4 times daily   fluticasone (FLONASE) 50 MCG/ACT nasal spray SPRAY 2 SPRAYS INTO EACH NOSTRIL EVERY DAY   fluticasone furoate-vilanterol (BREO ELLIPTA)  100-25 MCG/INH AEPB Inhale 1 puff into the lungs daily.   omeprazole (PRILOSEC) 40 MG capsule Take 1 capsule by mouth daily as needed.   primidone (MYSOLINE) 50 MG tablet Take 1 tablet (50 mg total) by mouth 3 (three) times daily.   valsartan-hydrochlorothiazide (DIOVAN-HCT) 80-12.5 MG per tablet Take 1 tablet by mouth daily.     Allergies:   Dextromethorphan polistirex er; Primidone; Albuterol; Amoxicillin; Asa [aspirin]; Loratadine; Nortriptyline; and Simvastatin   Social History   Tobacco Use   Smoking status: Never Smoker   Smokeless tobacco: Never Used  Substance Use Topics   Alcohol use: No   Drug use: No     Family Hx: The patient's family history includes Cancer in her mother; Heart attack in her sister; Heart failure (age of onset: 50) in her father. There is no history of Breast cancer.  ROS:   Please see the history of present illness.     All other systems reviewed and are negative.   Prior CV studies:   The following studies were reviewed today: Pharmacological stress test November 28, 2018  Labs/Other Tests and Data Reviewed:    EKG:  An ECG dated 11/28/2018 was personally reviewed today and demonstrated:  Sinus rhythm with a single PVC, left bundle branch block (QRS 126 ms), QTC 453 ms  Recent Labs: No results found for requested labs within last 8760 hours.   Recent Lipid Panel No results found for: CHOL, TRIG, HDL, CHOLHDL, LDLCALC, LDLDIRECT  Wt Readings from Last 3 Encounters:  12/27/18 135 lb (61.2 kg)  11/28/18 135 lb 3.2 oz (61.3 kg)  08/11/18 135 lb (61.2 kg)     Objective:    Vital Signs:  Ht 5\' 4"  (1.626 m)    BMI 23.17 kg/m    She did not have a blood pressure cuff to check her vital signs.  Unable to perform exam.  ASSESSMENT & PLAN:    1. Palpitations: These have not been a problem in a while and the metoprolol is causing more side effects than benefit.  We will stop it. 2. HTN: Unable to check her blood pressure today, but  chronically well controlled even when she was not taking metoprolol.  Labs performed June 2019 with Dr. Dagmar Hait showed normal potassium and creatinine levels.  Scheduled to see him again in June of this year. 3. HLP: Even in the absence of vascular disease, her LDL cholesterol 169 is very benign and requires treatment.  She did not tolerate simvastatin or rosuvastatin.  We will try pravastatin 40 mg at bedtime daily.  She will have repeat lipid profile at her yearly physical with Dr. Elsworth Soho.  If she does not tolerate this, I do not think it would be worth while to start her on Repatha for primary prevention. 4. LBBB: This was intermittent in the past, now that has been present on all of the recent tracings and is possibly permanent. 5. Pulmonary sarcoidosis: Appears to be quiescent.  Followed  by Dr. Annamaria Boots 6. Essential tremor: On primidone, sees Dr. Krista Blue.  The beta-blocker never really helped with this.  COVID-19 Education: The signs and symptoms of COVID-19 were discussed with the patient and how to seek care for testing (follow up with PCP or arrange E-visit).  The importance of social distancing was discussed today.  Time:   Today, I have spent 25 minutes with the patient with telehealth technology discussing the above problems.     Medication Adjustments/Labs and Tests Ordered: Current medicines are reviewed at length with the patient today.  Concerns regarding medicines are outlined above.  Tests Ordered: No orders of the defined types were placed in this encounter.  Medication Changes: No orders of the defined types were placed in this encounter.   Disposition:  Follow up 12 months  Signed, Sanda Klein, MD  01/27/2019 10:39 AM    [place]

## 2019-01-27 NOTE — Patient Instructions (Addendum)
Medication Instructions:  Pravastatin 40 mg at dinner/bedtime daily. Stop Metoprolol  If you need a refill on your cardiac medications before your next appointment, please call your pharmacy.   Lab work: Have your labs completed by Dr. Dagmar Hait in June.  Follow-Up: At [facility], you and your health needs are our priority.  As part of our continuing mission to provide you with exceptional heart care, we have created designated Provider Care Teams.  These Care Teams include your primary Cardiologist (physician) and Advanced Practice Providers (APPs -  Physician Assistants and Nurse Practitioners) who all work together to provide you with the care you need, when you need it. You will need a follow up appointment in 12 months.  Please call our office 2 months in advance to schedule this appointment.  You may see Sanda Klein, MD or one of the following Advanced Practice Providers on your designated Care Team: [place], [place] . Fabian Sharp, PA-C

## 2019-02-06 ENCOUNTER — Ambulatory Visit: Payer: Medicare Other | Admitting: Podiatry

## 2019-02-14 DIAGNOSIS — R519 Headache, unspecified: Secondary | ICD-10-CM | POA: Insufficient documentation

## 2019-02-17 ENCOUNTER — Other Ambulatory Visit: Payer: Self-pay | Admitting: Internal Medicine

## 2019-02-21 ENCOUNTER — Encounter: Payer: Self-pay | Admitting: Internal Medicine

## 2019-02-21 ENCOUNTER — Other Ambulatory Visit: Payer: Self-pay

## 2019-02-21 ENCOUNTER — Ambulatory Visit (INDEPENDENT_AMBULATORY_CARE_PROVIDER_SITE_OTHER): Payer: Medicare Other | Admitting: Internal Medicine

## 2019-02-21 DIAGNOSIS — K219 Gastro-esophageal reflux disease without esophagitis: Secondary | ICD-10-CM

## 2019-02-21 DIAGNOSIS — J441 Chronic obstructive pulmonary disease with (acute) exacerbation: Secondary | ICD-10-CM | POA: Diagnosis not present

## 2019-02-21 MED ORDER — PROMETHAZINE-CODEINE 6.25-10 MG/5ML PO SYRP: 5.0000 mL | ORAL_SOLUTION | Freq: Four times a day (QID) | ORAL | 0 refills | Status: DC | PRN

## 2019-02-21 MED ORDER — TIOTROPIUM BROMIDE MONOHYDRATE 2.5 MCG/ACT IN AERS: INHALATION_SPRAY | RESPIRATORY_TRACT | 12 refills | Status: DC

## 2019-02-21 NOTE — Assessment & Plan Note (Signed)
She thinks a little worse than baseline due to pollen. Nothing works to really clear it. Plan- try codeine cough syrup cautiously, try Spiriva added to Breo. Education done.

## 2019-02-21 NOTE — Patient Instructions (Signed)
Script sent for codeine cough syrup- use sparingly, may cause drowsiness  Script sent to add Spiriva Respimat inhaler   Inhale 2 puffs, once daily. Use this with the Breo. We will see if together they help your cough.  Please cal as needed

## 2019-02-21 NOTE — Assessment & Plan Note (Signed)
Reminder reflux precautions

## 2019-02-21 NOTE — Progress Notes (Signed)
HPI  Female never smoker followed for sarcoid, chronic bronchitis complicated by history of right breast cancer/ XRT, tachycardia, GERD, anxiety, glaucoma, tremor.[name]  PCP Dr Lucien Mons Med Cardiac Study 11/25/16- EF 77%, hyperdynamic, otw  WNL Office Spirometry 12/31/2016-mild restriction of exhaled volume. FVC 1.61/77%, FEV1 1.37/86%, ratio 0.85, FEF 25-75% 2.00/ 144% -----------------------------------------------------------------------------------------.  07/06/2018- 78 year old female never smoker followed for sarcoid, chronic bronchitis, accompanied by history right breast CA/XRT, Tachycardia, GERD, anxiety, Glaucoma, tremor -----Bronchitis: Pt notes SOB and cough-productive-clear in color.  Flonase, Breo 100, Increased watery eyes and nose she attributes to "allergy".  Variable cough remains nonproductive.  Using Breo inhaler only as needed saying it seems to make her more short of breath.  Told she has "a little bit of glaucoma in one eye". Exercises 3 days/week at [facility].  ROS- see HPI  + = positive Constitutional:   No-   weight loss, night sweats, fevers, chills,+ fatigue, lassitude. HEENT:   No-  headaches, difficulty swallowing, tooth/dental problems, sore throat,       No-  sneezing, itching, ear ache, nasal congestion, +post nasal drip,  CV:   +chest pain, orthopnea, PND, swelling in lower extremities, anasarca, dizziness, palpitations Resp: No-   shortness of breath with exertion or at rest.              productive cough,  + non-productive cough,  No-  coughing up of blood.              No-   change in color of mucus.  No- wheezing.   Skin: no-pruritus or rash  GI:  No-   heartburn, indigestion, abdominal pain, nausea, vomiting,  GU:. MS:  No-   joint pain or swelling.  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 No depression or anxiety.  No memory loss.  Obj General- Alert, Oriented, Affect-appropriate, Distress- none acute,                   + Always slumped, almost kyphotic posture Skin- + hyperpigmentation right forearm attributed to shingles Lymphadenopathy- none Head- atraumatic            Eyes- Gross vision intact, PERRLA, conjunctivae clear secretions            Ears- Hearing, canals-normal            Nose- Clear, no-Septal dev, mucus, polyps, erosion, perforation             Throat- Mallampati II , mucosa clear , drainage- none, tonsils- atrophic. + Dentures Neck- flexible , trachea midline, no stridor , thyroid nl, carotid no bruit Chest - symmetrical excursion , unlabored           Heart/CV- RRR with skipped beats , no murmur , no gallop  , no rub, nl s1 s2                - JVD- none , edema- none, stasis changes- none, varices- none           Lung- + bilateral coarse rhonchi ,  Work of breathing is not increased.   cough- none ,                           dullness-none, rub- none           Chest wall- +treatment Scar R breast, Abd- No HSM Br/ Gen/ Rectal- Not done, not indicated Extrem- cyanosis-  none, clubbing, none, atrophy- none, strength- nl Neuro- +Tremor .   02/21/2019- Virtual Visit via Telephone Note  I connected with Arrion Burruel Germani on 02/21/19 at  9:00 AM EDT by telephone and verified that I am speaking with the correct person using two identifiers.  Location: Patient: Home Provider:Office   I discussed the limitations, risks, security and privacy concerns of performing an evaluation and management service by telephone and the availability of in person appointments. I also discussed with the patient that there may be a patient responsible charge related to this service. The patient expressed understanding and agreed to proceed.   History of Present Illness: 78 year old female never smoker followed for sarcoid, chronic bronchitis, accompanied by history right breast CA/XRT, Tachycardia, GERD, anxiety, Glaucoma, tremor -----chronic cough, no mucus; was taking amoxicillin d/t sinus  infection, last dose; denies fever/sweats, always has chills, denies SOB/wheezing, states she's breathing out of mouth rather than nose Breo 100, Flonase,  Still c/o chronic cough, stuffy nose. Blaming pollen. Breo not helping. We intended  Bevespi inhaler last visit- doesn't remember getting it.   Observations/Objective: Nuc Med Cardiac Study 11/25/16- EF 77%, hyperdynamic, otw  WNL Office Spirometry 12/31/2016-mild restriction of exhaled volume. FVC 1.61/77%, FEV1 1.37/86%, ratio 0.85, FEF 25-75% 2.00/ 144% CT chest 10/14/119-  IMPRESSION: 1. Pulmonary parenchymal findings of sarcoid, mildly progressive from 01/27/2011. 2. 5 mm subpleural left lower lobe nodule is unchanged from 02/10/2018. Repeat CT chest without contrast in 12 months 3 could be performed to ensure continued stability, as clinically indicated. 3. Aortic atherosclerosis (ICD10-170.0). Coronary artery calcification.  Assessment and Plan: Cough due to chronic bronchitis and chronic sarcoid scarring  Follow Up Instructions: 1 year f/u. Codeine cough syrup, try Spiriva Res[pimat with Breo.   I discussed the assessment and treatment plan with the patient. The patient was provided an opportunity to ask questions and all were answered. The patient agreed with the plan and demonstrated an understanding of the instructions.   The patient was advised to call back or seek an in-person evaluation if the symptoms worsen or if the condition fails to improve as anticipated.  I provided 21 minutes of non-face-to-face time during this encounter.   Baird Lyons, MD

## 2019-02-24 ENCOUNTER — Encounter: Payer: Self-pay | Admitting: Podiatry

## 2019-02-24 ENCOUNTER — Ambulatory Visit: Payer: Medicare Other | Admitting: Podiatry

## 2019-02-24 ENCOUNTER — Other Ambulatory Visit: Payer: Self-pay

## 2019-02-24 VITALS — Temp 97.5°F

## 2019-02-24 DIAGNOSIS — B351 Tinea unguium: Secondary | ICD-10-CM | POA: Diagnosis not present

## 2019-02-24 DIAGNOSIS — M722 Plantar fascial fibromatosis: Secondary | ICD-10-CM

## 2019-02-24 DIAGNOSIS — M79676 Pain in unspecified toe(s): Secondary | ICD-10-CM | POA: Diagnosis not present

## 2019-02-24 DIAGNOSIS — Q828 Other specified congenital malformations of skin: Secondary | ICD-10-CM | POA: Diagnosis not present

## 2019-02-24 DIAGNOSIS — M779 Enthesopathy, unspecified: Secondary | ICD-10-CM | POA: Diagnosis not present

## 2019-02-26 NOTE — Progress Notes (Signed)
Subjective: Danielle Peters presents the office today for concerns of heel pain.  She also states that she has had nails and calluses trimmed today.  She states that she is replete however this morning she said that she had her heel majority of her leg.  She presents today for an acute appointment because of this.  She has had a longstanding history of heel pain.  Is difficult to communicate with her in regards to was actually causing discomfort.  It seems that today she is pointing to the callus and this was causing her pain to the heel.  She is prior to me had been seen by Dr. Paulla Peters and Dr. Sharol Peters.  She states they discussed stretching and other exercises and modalities to help with her pain but she does not feel this can be helpful for her foot pain.  She is also difficulty wearing shoes and she gets pain in the arch of her foot. The urea cream does help with her pain once she applies it. Denies any systemic complaints such as fevers, chills, nausea, vomiting. No acute changes since last appointment, and no other complaints at this time.   Objective: AAO x3, NAD DP/PT pulses palpable bilaterally, CRT less than 3 seconds Sensation appears to be intact with Semmes Weinstein monofilament Thickened hyperkeratotic lesions are present the posterior medial aspect left heel worse than right.  There is tenderness palpation on the hyperkeratotic lesion.  Upon evaluation today this seems to be where she is majority discomfort.  There is no area of discomfort to lateral compression of calcaneus there is no pain to the calf or along the Achilles tendon.  Subjectively she is getting discomfort in the arch of her feet bilaterally.  She describes a pins and needle sensation at times.  There is no elongated x10.  No open lesions or pre-ulcerative lesions.  No pain with calf compression, swelling, warmth, erythema  Assessment: Chronic bilateral heel pain, arch pain; symptomatic hyperkeratotisc lesion, onychomycosis  Plan: -All  treatment options discussed with the patient including all alternatives, risks, complications.  -Hyperkeratotic lesions were sharply debrided x2 without any complications or bleeding -Nails debrided x10 without any complications or bleeding -We long discussion today Peters regards to her treatment options as well as was causing her discomfort.  It is hard to continue with current see was exactly causing her pain but seems is getting more from the callus.  On the way out of the office today after debridement she states that her feet felt much better.  Also discussed other treatment options to help with what I think is causing some tendinitis.  I previously ordered physical therapy but she declined this and she also seen other doctors who recommended home therapy which she declined.  She is a.  New prescription was written today for [facility].  Also it seems that some of her discomfort may be nerve related.  She also hold any oral medication.  We will do the [employer] when she goes to PT -Discussed using the urea cream on a weekly basis to help prevent the calluses from getting too thick. -Patient encouraged to call the office with any questions, concerns, change in symptoms.   Danielle Peters DPM

## 2019-03-08 ENCOUNTER — Telehealth: Payer: Self-pay | Admitting: Internal Medicine

## 2019-03-08 NOTE — Telephone Encounter (Signed)
Called and spoke with patient regarding test being done or filling out papers There is nothing documented of any paperwork needed on pt nor tests to complete Pt verbalized and expressed understanding Nothing further needed.

## 2019-03-09 ENCOUNTER — Ambulatory Visit: Payer: Medicare Other | Admitting: Podiatry

## 2019-03-31 ENCOUNTER — Telehealth: Payer: Self-pay | Admitting: Cardiovascular Disease

## 2019-03-31 MED ORDER — ROSUVASTATIN CALCIUM 10 MG PO TABS: 10.0000 mg | ORAL_TABLET | Freq: Every day | ORAL | 3 refills | Status: DC

## 2019-03-31 NOTE — Telephone Encounter (Signed)
Spoke with patient. She has been notified of MD recommendations

## 2019-03-31 NOTE — Telephone Encounter (Signed)
Returned call to patient who states she is nervous to take pravastatin as it can cause yellowing of the eye. She is concerned about SE. She would prefer to take crestor. Routed to MD/RN

## 2019-03-31 NOTE — Telephone Encounter (Signed)
The patient has verbalized her understanding. Rosuvastatin 10 mg once daily, to replace pravastatin, has been sent into [facility] for her.

## 2019-03-31 NOTE — Telephone Encounter (Signed)
° °  Pt c/o medication issue:  1. Name of Medication:  PRAVASTATIN 40 mg  2. How are you currently taking this medication (dosage and times per day)? 1 daily  3. Are you having a reaction (difficulty breathing--STAT)? No   4. What is your medication issue? Per pt call she would like to change this medication.  [employer] sent her a letter concerning this medication.   To CRESTROL?

## 2019-03-31 NOTE — Telephone Encounter (Signed)
I have no problem with switching to rosuvastatin 10 mg once daily, #90, RF 3, if she prefers.

## 2019-04-14 ENCOUNTER — Telehealth: Payer: Self-pay

## 2019-04-14 NOTE — Telephone Encounter (Signed)
Patient states that she is having problems in her feet, " it feels like bees are in my feet" . Please give me a call back

## 2019-05-01 ENCOUNTER — Other Ambulatory Visit: Payer: Self-pay

## 2019-05-01 ENCOUNTER — Ambulatory Visit: Payer: Medicare Other | Admitting: Podiatry

## 2019-05-01 ENCOUNTER — Encounter: Payer: Self-pay | Admitting: Podiatry

## 2019-05-01 VITALS — Temp 98.2°F

## 2019-05-01 DIAGNOSIS — B351 Tinea unguium: Secondary | ICD-10-CM

## 2019-05-01 DIAGNOSIS — Q828 Other specified congenital malformations of skin: Secondary | ICD-10-CM

## 2019-05-01 DIAGNOSIS — M79672 Pain in left foot: Secondary | ICD-10-CM

## 2019-05-01 DIAGNOSIS — M79676 Pain in unspecified toe(s): Secondary | ICD-10-CM

## 2019-05-01 DIAGNOSIS — M79671 Pain in right foot: Secondary | ICD-10-CM

## 2019-05-03 NOTE — Progress Notes (Signed)
  Subjective: 78 y.o. returns the office today for painful, elongated, thickened toenails which she cannot trim herself. Denies any redness or drainage around the nails/calluses.  She still going to physical therapy which is been helpful.  She states that she feels much better after trim the nails and calluses previously.  Denies any acute changes since last appointment and no new complaints today. Denies any systemic complaints such as fevers, chills, nausea, vomiting.   PCP: Prince Solian, MD  Objective: AAO 3, NAD DP/PT pulses palpable, CRT less than 3 seconds Nails hypertrophic, dystrophic, elongated, brittle, discolored  10. There is tenderness overlying the nails 1-5 bilaterally. There is no surrounding erythema or drainage along the nail sites. Hyperkeratotic lesions present to bilateral posterior heels left side worse than right as well as submetatarsal x2.  Upon debridement there is no underlying ulceration, drainage or any clinical signs of infection noted today.  Overall the pain that she is experiencing last appointment and previously has improved with physical therapy. No open lesions or pre-ulcerative lesions are identified. No pain with calf compression, swelling, warmth, erythema.  Assessment: Patient presents with symptomatic onychomycosis  Plan: -Treatment options including alternatives, risks, complications were discussed -Nails sharply debrided 10 without complication/bleeding. -Hyperkeratotic lesion sub-debrided x4 without any complications or bleeding -Continue with physical therapy. -Discussed daily foot inspection. If there are any changes, to call the office immediately.  -Follow-up in 3 months or sooner if any problems are to arise. In the meantime, encouraged to call the office with any questions, concerns, changes symptoms.  Celesta Gentile, DPM

## 2019-05-04 ENCOUNTER — Other Ambulatory Visit: Payer: Self-pay | Admitting: Internal Medicine

## 2019-05-04 DIAGNOSIS — Z1231 Encounter for screening mammogram for malignant neoplasm of breast: Secondary | ICD-10-CM

## 2019-06-06 ENCOUNTER — Ambulatory Visit: Payer: Medicare Other | Admitting: Adult Health

## 2019-06-07 DIAGNOSIS — L0889 Other specified local infections of the skin and subcutaneous tissue: Secondary | ICD-10-CM | POA: Insufficient documentation

## 2019-06-07 DIAGNOSIS — N6459 Other signs and symptoms in breast: Secondary | ICD-10-CM | POA: Insufficient documentation

## 2019-06-07 DIAGNOSIS — R5383 Other fatigue: Secondary | ICD-10-CM | POA: Insufficient documentation

## 2019-06-19 ENCOUNTER — Telehealth: Payer: Self-pay | Admitting: Neurology

## 2019-06-19 NOTE — Telephone Encounter (Signed)
I called this patient and LVM regarding rescheduling 9/2 appointment due to Judson Roch being out of office.

## 2019-06-20 ENCOUNTER — Other Ambulatory Visit: Payer: Self-pay

## 2019-06-20 ENCOUNTER — Ambulatory Visit
Admission: RE | Admit: 2019-06-20 | Discharge: 2019-06-20 | Disposition: A | Discharge status: Home / Self Care | Payer: Medicare Other | Source: Ambulatory Visit | Attending: Internal Medicine | Admitting: Internal Medicine

## 2019-06-20 DIAGNOSIS — Z1231 Encounter for screening mammogram for malignant neoplasm of breast: Secondary | ICD-10-CM

## 2019-06-21 ENCOUNTER — Encounter: Payer: Self-pay | Admitting: Podiatry

## 2019-06-21 ENCOUNTER — Other Ambulatory Visit: Payer: Self-pay | Admitting: Podiatry

## 2019-06-21 ENCOUNTER — Ambulatory Visit (INDEPENDENT_AMBULATORY_CARE_PROVIDER_SITE_OTHER): Payer: Medicare Other

## 2019-06-21 ENCOUNTER — Ambulatory Visit: Payer: Medicare Other | Admitting: Podiatry

## 2019-06-21 ENCOUNTER — Other Ambulatory Visit: Payer: Self-pay

## 2019-06-21 ENCOUNTER — Ambulatory Visit: Payer: Medicare Other | Admitting: Neurology

## 2019-06-21 DIAGNOSIS — G629 Polyneuropathy, unspecified: Secondary | ICD-10-CM

## 2019-06-21 DIAGNOSIS — M779 Enthesopathy, unspecified: Secondary | ICD-10-CM

## 2019-06-21 DIAGNOSIS — L84 Corns and callosities: Secondary | ICD-10-CM

## 2019-06-21 DIAGNOSIS — M722 Plantar fascial fibromatosis: Secondary | ICD-10-CM

## 2019-06-21 MED ORDER — GABAPENTIN 300 MG PO CAPS: 300.0000 mg | ORAL_CAPSULE | Freq: Three times a day (TID) | ORAL | 3 refills | Status: DC

## 2019-06-21 NOTE — Progress Notes (Signed)
Subjective:   Patient ID: Danielle Peters, female   DOB: 78 y.o.   MRN: [id number]   HPI Patient presents stating that she does get still some pain in the back of the heels and it seems to be more burning pain mostly at night also has a lesion on the inside the left heel that bothers neuro but   ROS      Objective:  Physical Exam  Vascular status unchanged with discomfort in the posterior heel which appears to be more neuropathic versus inflammatory with possibilities for other condition associated with this.  Patient has good digital perfusion well oriented x3 and does have a lesion on the inside the left heel that is painful when pressed     Assessment:  Achilles tendinitis posterior tibial tendinitis and neuropathic-like symptomatology along with lesion left     Plan:  Sterile debridement of lesion left iatrogenic bleeding and went ahead and will start her on gabapentin taking 1 pill at night and 1 during the morning mid afternoon if tolerated well.  Will let us know if it causes any adverse symptoms and I did debride the lesion on the left and will be seen back to recheck  X-ray indicates there is no signs of posterior spur or stress fracture

## 2019-07-03 ENCOUNTER — Ambulatory Visit (INDEPENDENT_AMBULATORY_CARE_PROVIDER_SITE_OTHER): Payer: Medicare Other | Admitting: Podiatry

## 2019-07-03 ENCOUNTER — Encounter: Payer: Self-pay | Admitting: Podiatry

## 2019-07-03 ENCOUNTER — Other Ambulatory Visit: Payer: Self-pay

## 2019-07-03 DIAGNOSIS — B351 Tinea unguium: Secondary | ICD-10-CM

## 2019-07-03 DIAGNOSIS — G629 Polyneuropathy, unspecified: Secondary | ICD-10-CM | POA: Diagnosis not present

## 2019-07-03 DIAGNOSIS — Q828 Other specified congenital malformations of skin: Secondary | ICD-10-CM | POA: Diagnosis not present

## 2019-07-03 DIAGNOSIS — M79676 Pain in unspecified toe(s): Secondary | ICD-10-CM

## 2019-07-07 NOTE — Progress Notes (Signed)
Subjective: 78 y.o. returns the office today for painful, elongated, thickened toenails which she cannot trim herself as well as for calluses in the back of her heels.  Denies any redness or drainage around the nails/calluses.  Physical therapy is been helpful.  Overall her pain is improving.  Denies any acute changes since last appointment and no new complaints today. Denies any systemic complaints such as fevers, chills, nausea, vomiting.   PCP: Prince Solian, MD   Objective: AAO 3, NAD DP/PT pulses palpable, CRT less than 3 seconds Nails hypertrophic, dystrophic, elongated, brittle, discolored 10. There is tenderness overlying the nails 1-5 bilaterally. There is no surrounding erythema or drainage along the nail sites. No open lesions or pre-ulcerative lesions are identified. Hyperkeratotic lesions to posterior aspect of bilateral heels.  No ongoing ulceration drainage or any signs of infection. No other areas of tenderness bilateral lower extremities. No overlying edema, erythema, increased warmth. No pain with calf compression, swelling, warmth, erythema.  Assessment: Patient presents with symptomatic onychomycosis; Pre-ulcerative calluses  Plan: -Treatment options including alternatives, risks, complications were discussed -Nails sharply debrided 10 without complication/bleeding. -Lesion should be debrided x2 without any complications or bleeding.  Moisturizer daily. -Continue physical therapy for now. -Discussed daily foot inspection. If there are any changes, to call the office immediately.  -Follow-up in 3 months or sooner if any problems are to arise. In the meantime, encouraged to call the office with any questions, concerns, changes symptoms.  Celesta Gentile, DPM

## 2019-07-21 ENCOUNTER — Other Ambulatory Visit: Payer: Self-pay | Admitting: Neurology

## 2019-07-24 DIAGNOSIS — J189 Pneumonia, unspecified organism: Secondary | ICD-10-CM | POA: Insufficient documentation

## 2019-07-24 DIAGNOSIS — R059 Cough, unspecified: Secondary | ICD-10-CM | POA: Insufficient documentation

## 2019-07-24 DIAGNOSIS — Z9189 Other specified personal risk factors, not elsewhere classified: Secondary | ICD-10-CM | POA: Insufficient documentation

## 2019-08-02 ENCOUNTER — Ambulatory Visit: Payer: Medicare Other | Admitting: Neurology

## 2019-08-07 DIAGNOSIS — S46191A Other injury of muscle, fascia and tendon of long head of biceps, right arm, initial encounter: Secondary | ICD-10-CM | POA: Insufficient documentation

## 2019-08-22 ENCOUNTER — Telehealth: Payer: Self-pay | Admitting: *Deleted

## 2019-08-22 NOTE — Telephone Encounter (Signed)
Called pt to do covid screening. Pt says yes to congestion/runny nose Active cough and sore throat. She had pneumonia about 2 weeks ago. She was seen by her pcp who she says did a covid test which was negatvie. Are you ok for patient to come in for appt 9/4?

## 2019-08-22 NOTE — Telephone Encounter (Signed)
Yes Carolin Coy

## 2019-08-22 NOTE — Telephone Encounter (Signed)
Pt aware nothing further needed.

## 2019-08-23 ENCOUNTER — Other Ambulatory Visit: Payer: Self-pay

## 2019-08-23 ENCOUNTER — Encounter: Payer: Self-pay | Admitting: Internal Medicine

## 2019-08-23 ENCOUNTER — Ambulatory Visit: Payer: Medicare Other | Admitting: Internal Medicine

## 2019-08-23 DIAGNOSIS — Z23 Encounter for immunization: Secondary | ICD-10-CM | POA: Diagnosis not present

## 2019-08-23 DIAGNOSIS — J441 Chronic obstructive pulmonary disease with (acute) exacerbation: Secondary | ICD-10-CM | POA: Diagnosis not present

## 2019-08-23 DIAGNOSIS — F411 Generalized anxiety disorder: Secondary | ICD-10-CM | POA: Diagnosis not present

## 2019-08-23 NOTE — Progress Notes (Signed)
HPI  Female never smoker followed for sarcoid, chronic bronchitis complicated by history of right breast cancer/ XRT, tachycardia, GERD, anxiety, glaucoma, tremor.[name]  PCP Dr Lucien Mons Med Cardiac Study 11/25/16- EF 77%, hyperdynamic, otw  WNL Office Spirometry 12/31/2016-mild restriction of exhaled volume. FVC 1.61/77%, FEV1 1.37/86%, ratio 0.85, FEF 25-75% 2.00/ 144% -----------------------------------------------------------------------------------------.  02/21/2019- Virtual Visit via Telephone Note History of Present Illness: 78 year old female never smoker followed for sarcoid, chronic bronchitis, accompanied by history right breast CA/XRT, Tachycardia, GERD, anxiety, Glaucoma, tremor -----chronic cough, no mucus; was taking amoxicillin d/t sinus infection, last dose; denies fever/sweats, always has chills, denies SOB/wheezing, states she's breathing out of mouth rather than nose Breo 100, Flonase,  Still c/o chronic cough, stuffy nose. Blaming pollen. Breo not helping. We intended  Bevespi inhaler last visit- doesn't remember getting it.  Observations/Objective: Nuc Med Cardiac Study 11/25/16- EF 77%, hyperdynamic, otw  WNL Office Spirometry 12/31/2016-mild restriction of exhaled volume. FVC 1.61/77%, FEV1 1.37/86%, ratio 0.85, FEF 25-75% 2.00/ 144% CT chest 10/14/119-  IMPRESSION: 1. Pulmonary parenchymal findings of sarcoid, mildly progressive from 01/27/2011. 2. 5 mm subpleural left lower lobe nodule is unchanged from 02/10/2018. Repeat CT chest without contrast in 12 months 3 could be performed to ensure continued stability, as clinically indicated. 3. Aortic atherosclerosis (ICD10-170.0). Coronary artery calcification. Assessment and Plan: Cough due to chronic bronchitis and chronic sarcoid scarring Follow Up Instructions: 1 year f/u. Codeine cough syrup, try Spiriva Respimat with Breo.   08/23/2019- 78 year old female never smoker followed for Sarcoid, Chronic Bronchitis, accompanied by  history right breast CA/XRT, Tachycardia, GERD, anxiety, Glaucoma, tremor Treated by PCP for pneumonia 4 weeks ago- tested neg for Covid then. Breo 100, Spiriva Respimat,  -----6 month f/u for COPD and PNA. States she is still recovering from PNA 4 weeks ago.   She says levaquin for pnuemonia caused tendonitis in her arm. Feels run down. Cough may be close to her baseline. Minimal throat soreness.  Spiriva Respimat too hard for her tremulous hands so she hasn't been using it.   ROS- see HPI  + = positive Constitutional:   No-   weight loss, night sweats, fevers, chills,+ fatigue, lassitude. HEENT:   No-  headaches, difficulty swallowing, tooth/dental problems, sore throat,       No-  sneezing, itching, ear ache, nasal congestion, +post nasal drip,  CV:   chest pain, orthopnea, PND, swelling in lower extremities, anasarca, dizziness, palpitations Resp: No-   shortness of breath with exertion or at rest.              productive cough,  + non-productive cough,  No-  coughing up of blood.              No-   change in color of mucus.  No- wheezing.   Skin: no-pruritus or rash  GI:  No-   heartburn, indigestion, abdominal pain, nausea, vomiting,  GU:. MS:  No-   joint pain or swelling.  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 No depression or anxiety.  No memory loss.  Obj General- Alert, Oriented, Affect-appropriate, Distress- none acute,                     + Always slumped, almost kyphotic posture Skin- + hyperpigmentation right forearm attributed to shingles Lymphadenopathy- none Head- atraumatic            Eyes- Gross vision intact, PERRLA, conjunctivae clear secretions  Ears- Hearing, canals-normal            Nose- Clear, no-Septal dev, mucus, polyps, erosion, perforation             Throat- Mallampati II , mucosa clear , drainage- none, tonsils- atrophic. + Dentures Neck- flexible , trachea midline, no stridor , thyroid nl,  carotid no bruit Chest - symmetrical excursion , unlabored           Heart/CV- RRR with skipped beats , no murmur , no gallop  , no rub, nl s1 s2                - JVD- none , edema- none, stasis changes- none, varices- none           Lung- + inspiratory squeaks upper zones ,  Work of breathing is not increased.   cough- none , dullness-none, rub- none           Chest wall- +treatment Scar R breast, Abd- No HSM Br/ Gen/ Rectal- Not done, not indicated Extrem- cyanosis- none, clubbing, none, atrophy- none, strength- nl Neuro- + significant tremor hands

## 2019-08-23 NOTE — Assessment & Plan Note (Signed)
Pneumonia of unspecified type resolving after management by Dr Dagmar Hait. She still feels run down, but exam is similar to her  Baseline now.  She is unable to manipulate her Spiriva due to tremor. With her glaucoma, I suggested she stay off it to see how her breathing does. Flu vax given

## 2019-08-23 NOTE — Assessment & Plan Note (Signed)
Feels residual fatigue after pneumonia, which can last weeks. Discussed with her.  If tolerated by her anxiety and her tremor, I suggested mild caffeine, as in tea, to see if that helps

## 2019-08-23 NOTE — Patient Instructions (Signed)
Order- flu vax senior  Ok to not use Spiriva inhaler   Please call if we can help

## 2019-08-23 NOTE — Progress Notes (Signed)
PATIENT: Danielle Peters DOB: 01-03-1941  REASON FOR VISIT: follow up HISTORY FROM: patient  HISTORY OF PRESENT ILLNESS: Today 08/24/19  HISTORY HISTORY OF PRESENT ILLNESS: Danielle Peters is a 78 year old right-handed American female, follow up for essential tremor   She has past medical history of hypertension, hyperlipidemia, anxiety, history of right breast cancer, status post lobectomy in 2004, followed by radiation and chemotherapy, she developed bilateral hands paresthesia following chemotherapy consistent with-induced peripheral neuropathy. She presenting with few years history of bilateral hands tremor, getting worse when she stretched out her hands, or using utensils, she has describe difficulty feeding herself, cause a lot of social embarrasement, she denies significant gait difficulty, she also has head titubation She denied bilateral lower exremity paresthesia weakness, but does complain fingertips numbness tingling. She denied loss of smell, REM sleep disorder, has anxiety symptoms, denied family history of tremor Over the past few years, we have tried primadone, while taking 50mg  1/2 once a day , there was no significant improvement at that time, she also complains of headaches, but also tried diazepam, Ativan, without significant improvement  UPDATE Oct 08 2014:  She is no longer taking primidone, she is taking clonazepam 0.5 mg half to one tablets every morning, which has helped her tremor, she complains of anxiety, but does not want to take more medications for it  UPDATE Oct 07 2016:  She took clonazepam 0.5 milligrams half to 1 tablet as needed for tremor for a while without significant improvement, she continue have worsening bilateral hands resting and posturing tremor, occasionally involving bilateral lower extremity, she denies significant gait abnormality, she exercise regularly, she complains of recent onset of left-sided low back pain, radiating pain to  left lower extremity, left calf muscle. She is taking Lyrica 75 mg a day  She just healed from right arm shingles in June 2017, still has intermittent itching in her right arm and hand,  UPDATE January 05 2017: She complains of constant bilateral hand resting and posturing tremor, previously tried primidone, clonazepam with limited help, currently she is taking Valium 2 mg as needed, which helps her some, she previously took metoprolol low-dose did not notice any help for her tremor,  This is most consistent with essential tremor, there was no family history of similar disease, no parkinsonian features.  Her low back pain has much improved, she has stopped taking nortriptyline  UPDATE May 31 2018: Her older sister has parkinson's disease.  She now noticed worsening bilateral hands tremor, previously could not tolerate clonazepam cause excessive drowsiness, beta-blocker, she did not notice any significant benefit, she is now taking primidone 50 mg twice a day, higher dose would cause significant drowsiness, she denied loss of sense of smell, she denies significant gait abnormality.  Update August 24, 2019 SS: She remains on primidone 50 mg twice a day, in the morning, and at 3 pm.  She was unable to tolerate 100 mg dose at bedtime, due to feeling more shaky in the morning.  She feels over time the tremor in her hands has worsened, occasional head tremor tilt.  She does notice that when she does not take the primidone her tremor is worse.  The tremor in her hands is worse in the right than the left.  She says it does not interfere with her daily activity or function, she is able to eat, but has a hard time with soups.  She lives alone, drives a car without difficulty.  She has been put on  gabapentin for leg pain, which may also benefit her tremor.  She has not had any falls, but feels her balance isn't as good since she hasn't been able to get into the gym to do her exercises. Her older sister  with Parkinson's disease, has since passed away. She has routine follow-up with her PCP.   REVIEW OF SYSTEMS: Out of a complete 14 system review of symptoms, the patient complains only of the following symptoms, and all other reviewed systems are negative.  Dizziness, headache, tremors  ALLERGIES: Allergies  Allergen Reactions  . Dextromethorphan Polistirex Er Other (See Comments)    SOB  . Primidone     Severe Headaches  . Albuterol     REACTION: tachycardia  . Amoxicillin Diarrhea and Nausea Only  . Asa [Aspirin]     Adult dose  . Levaquin [Levofloxacin] Other (See Comments)    Per patient, caused tendonitis.   . Loratadine   . Nortriptyline     Increase heart rate.  . Simvastatin     Myalgias     HOME MEDICATIONS: Outpatient Medications Prior to Visit  Medication Sig Dispense Refill  . aspirin 81 MG tablet Take 81 mg by mouth every other day.     . betamethasone dipropionate (DIPROLENE) 0.05 % cream Apply 1 application topically 2 (two) times daily.    [name] BREO ELLIPTA 100-25 MCG/INH AEPB TAKE 1 PUFF BY MOUTH EVERY DAY 60 each 5  . [facility] DIGESTIVE PROBIOTIC 250 MG capsule TAKE 1 CAPSULE EVERY DAY FOR 1 MONTH    . diazepam (VALIUM) 2 MG tablet Take 2 mg by mouth every 6 (six) hours as needed for anxiety.    . diclofenac sodium (VOLTAREN) 1 % GEL Apply 2 g topically 4 (four) times daily. Rub into affected area of foot 2 to 4 times daily 100 g 2  . fluticasone (FLONASE) 50 MCG/ACT nasal spray SPRAY 2 SPRAYS INTO EACH NOSTRIL EVERY DAY  11  . gabapentin (NEURONTIN) 300 MG capsule Take 1 capsule (300 mg total) by mouth 3 (three) times daily. 90 capsule 3  . hydrocortisone 2.5 % lotion APPLY TOPICALLY TO THE FACE DAILY AS NEEDED.    . metoprolol tartrate (LOPRESSOR) 25 MG tablet Take 25 mg by mouth 2 (two) times daily. Take 1/2 2x daily    . omeprazole (PRILOSEC) 40 MG capsule Take 1 capsule by mouth daily as needed.    . ondansetron (ZOFRAN-ODT) 4 MG disintegrating tablet Take 4  mg by mouth every 8 (eight) hours as needed.    . triamcinolone cream (KENALOG) 0.1 % APPLY TO AFFECTED AREA AS NEEDED AS DIRECTED    . valsartan-hydrochlorothiazide (DIOVAN-HCT) 80-12.5 MG per tablet Take 1 tablet by mouth daily.  4  . primidone (MYSOLINE) 50 MG tablet Take 1 tablet (50 mg total) by mouth 3 (three) times daily. 270 tablet 4  . KLOR-CON M20 20 MEQ tablet     . rosuvastatin (CRESTOR) 10 MG tablet Take 1 tablet (10 mg total) by mouth daily. 90 tablet 3  . Tiotropium Bromide Monohydrate (SPIRIVA RESPIMAT) 2.5 MCG/ACT AERS Inhale 2 puffs, once daily 4 g 12  . promethazine-codeine (PHENERGAN WITH CODEINE) 6.25-10 MG/5ML syrup Take 5 mLs by mouth every 6 (six) hours as needed for cough. 200 mL 0   No facility-administered medications prior to visit.     PAST MEDICAL HISTORY: Past Medical History:  Diagnosis Date  . Anemia   . Anxiety   . Breast cancer ([place]) 2004   right  .  Cancer ([place])    breast ca  . Chest pain    echo 01/08/10- nl lv; mild aortic sclerosis; myoview 01/08/10- no ischemia  . Dyslipidemia    myaligia with statins  . Esophageal reflux   . Hypertension   . Personal history of chemotherapy 2004  . Personal history of radiation therapy 2004  . Pulmonary sarcoidosis ([place])   . Shingles 03/2016  . Tremor    upper extremities  . Ventricular tachycardia (paroxysmal) ([facility])    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; AV node reentry tachycardia ablation     PAST SURGICAL HISTORY: Past Surgical History:  Procedure Laterality Date  . Boil Right 03/2015   on buttock, excision  . BREAST LUMPECTOMY Right 2004  . left breast lumpectomy,chemo  2004   xrt  . SKIN BIOPSY    . UPPER GI ENDOSCOPY  02/2015   difficulty swallowing, Abd pain    FAMILY HISTORY: Family History  Problem Relation Age of Onset  . Heart failure Father 64  . Cancer Mother   . Heart attack Sister        2 heart attacks  . Breast cancer Neg Hx     SOCIAL HISTORY: Social History   Socioeconomic  History  . Marital status: Divorced    Spouse name: Not on file  . Number of children: 2  . Years of education: 92  . Highest education level: Not on file  Occupational History  . 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 Financial resource strain: Not on file  . Food insecurity    Worry: Not on file    Inability: Not on file  . Transportation needs    Medical: Not on file    Non-medical: Not on file  Tobacco Use  . Smoking status: Never Smoker  . Smokeless tobacco: Never Used  Substance and Sexual Activity  . Alcohol use: No  . Drug use: No  . Sexual activity: Not on file  Lifestyle  . Physical activity    Days per week: Not on file    Minutes per session: Not on file  . Stress: Not on file  Relationships  . Social Herbalist on phone: Not on file    Gets together: Not on file    Attends religious service: Not on file    Active member of club or organization: Not on file    Attends meetings of clubs or organizations: Not on file    Relationship status: Not on file  . Intimate partner violence    Fear of current or ex partner: Not on file    Emotionally abused: Not on file    Physically abused: Not on file    Forced sexual activity: Not on file  Other Topics Concern  . Not on file  Social History Narrative   Patient lives at home alone. Patient is retired.Pateint has high school education. And went to [employer].   Right handed.   Caffeine- one cup daily.    PHYSICAL EXAM  Vitals:   08/24/19 0831  BP: (!) 147/83  Pulse: 84  Temp: 97.8 F (36.6 C)  Weight: 139 lb 6.4 oz (63.2 kg)  Height: 5\' 4"  (1.626 m)   Body mass index is 23.93 kg/m.  Generalized: Well developed, in no acute distress   Neurological examination  Mentation: Alert oriented to time, place, history taking. Follows all commands speech and language fluent Cranial nerve II-XII: Pupils were equal round reactive to light.  Extraocular movements were full, visual field  were full on confrontational test. Facial sensation and strength were normal.  Head turning and shoulder shrug  were normal and symmetric.  Mild head tilt tremor. Motor: The motor testing reveals 5 over 5 strength of all 4 extremities. Good symmetric motor tone is noted throughout.  Bilateral upper extremity resting and postural tremor, right greater than the left, no significant rigidity with reinforcement, no bradykinesia noted Sensory: Sensory testing is intact to soft touch on all 4 extremities. No evidence of extinction is noted.  Coordination: Cerebellar testing reveals good finger-nose-finger and heel-to-shin bilaterally.  Gait and station: Gait is normal. Tandem gait is mildly unsteady. Romberg is negative. No drift is seen.  Good stride, good turns, good arm swing Reflexes: Deep tendon reflexes are symmetric and normal bilaterally.   DIAGNOSTIC DATA (LABS, IMAGING, TESTING) - I reviewed patient records, labs, notes, testing and imaging myself where available.  Lab Results  Component Value Date   WBC 7.5 01/02/2016   HGB 13.6 01/02/2016   HCT 42.1 01/02/2016   MCV 85.9 01/02/2016   PLT 162 01/02/2016      Component Value Date/Time   NA 139 11/16/2016 1001   K 4.2 11/16/2016 1001   CL 99 11/16/2016 1001   CO2 28 11/16/2016 1001   GLUCOSE 102 (H) 11/16/2016 1001   BUN 15 11/16/2016 1001   CREATININE 0.93 11/16/2016 1001   CALCIUM 10.5 (H) 11/16/2016 1001   PROT 7.6 05/05/2010 1438   ALBUMIN 3.9 05/05/2010 1438   AST 29 05/05/2010 1438   ALT 19 05/05/2010 1438   ALKPHOS 105 05/05/2010 1438   BILITOT 1.0 05/05/2010 1438   GFRNONAA 45 (L) 01/02/2016 1058   GFRAA 52 (L) 01/02/2016 1058   No results found for: CHOL, HDL, LDLCALC, LDLDIRECT, TRIG, CHOLHDL No results found for: HGBA1C No results found for: VITAMINB12 Lab Results  Component Value Date   TSH 0.99 11/16/2016    ASSESSMENT AND PLAN 78 y.o. year old female  has a past medical history of Anemia, Anxiety, Breast  cancer ([place]) (2004), Cancer [facility]), Chest pain, Dyslipidemia, Esophageal reflux, Hypertension, Personal history of chemotherapy (2004), Personal history of radiation therapy (2004), Pulmonary sarcoidosis ([place]), Shingles (03/2016), Tremor, and Ventricular tachycardia (paroxysmal) ([place]). here with:  1.  Essential tremor -Resting and posturing component, no bradykinesia, or rigidity -Previously she has tried clonazepam, low-dose beta-blocker, without significant benefit or tolerability -She has seen benefit with primidone, continue primidone 50 mg twice a day, higher dose of 50/100 she was unable to tolerate, discussed 50 mg 3 times a day, doesn't want to adjust at this time  -She is now taking gabapentin for leg pain, which may also benefit her tremor -Follow-up in 1 year or sooner if needed  I spent 15 minutes with the patient. 50% of this time was spent discussing her plan of care.   Butler Denmark, AGNP-C, DNP 08/24/2019, 8:59 AM [facility] [address], [place], [place] [address] [phone]

## 2019-08-24 ENCOUNTER — Ambulatory Visit: Payer: Medicare Other | Admitting: Neurology

## 2019-08-24 ENCOUNTER — Encounter: Payer: Self-pay | Admitting: Neurology

## 2019-08-24 VITALS — BP 147/83 | HR 84 | Temp 97.8°F | Ht 64.0 in | Wt 139.4 lb

## 2019-08-24 DIAGNOSIS — G250 Essential tremor: Secondary | ICD-10-CM

## 2019-08-24 MED ORDER — PRIMIDONE 50 MG PO TABS: 50.0000 mg | ORAL_TABLET | Freq: Two times a day (BID) | ORAL | 3 refills | Status: DC

## 2019-08-24 NOTE — Patient Instructions (Signed)
You may continue Primidone 50 mg twice a day for your tremor. You are on gabapentin, which may also benefit your tremor. Return in 1 year.

## 2019-08-28 ENCOUNTER — Telehealth: Payer: Self-pay | Admitting: Internal Medicine

## 2019-08-28 NOTE — Telephone Encounter (Signed)
This itching and rash should clear in a couple of days. Ok to take an antihistamine like Benadryl if needed for itching. If it lasts more than a couple of days, she can start putting an otc cortisone ointment on it like Topicort.

## 2019-08-28 NOTE — Telephone Encounter (Signed)
Call returned to patient, confirmed DOB, she states she got a Flu shot on 11/4. She states the area was "puffy" and it started itching really bad. She cleaned it with alcohol which seemed to help. She states the "puffiness" has gone down but the rash and itchiness remains. Requesting recommendations. I made her aware I would get these recommendations to CY and get back with her. She denies any drainage from site. Denies   CY please advise.

## 2019-08-28 NOTE — Telephone Encounter (Signed)
Spoke with pt, aware of CY's recs.  Nothing further needed at this time- will close encounter.

## 2019-09-04 NOTE — Progress Notes (Signed)
I have reviewed and agreed above plan. 

## 2019-10-02 ENCOUNTER — Ambulatory Visit: Payer: Medicare Other | Admitting: Podiatry

## 2019-10-02 ENCOUNTER — Other Ambulatory Visit: Payer: Self-pay

## 2019-10-02 DIAGNOSIS — G629 Polyneuropathy, unspecified: Secondary | ICD-10-CM

## 2019-10-02 DIAGNOSIS — B351 Tinea unguium: Secondary | ICD-10-CM | POA: Diagnosis not present

## 2019-10-02 DIAGNOSIS — M79676 Pain in unspecified toe(s): Secondary | ICD-10-CM

## 2019-10-02 DIAGNOSIS — Q828 Other specified congenital malformations of skin: Secondary | ICD-10-CM | POA: Diagnosis not present

## 2019-10-11 NOTE — Progress Notes (Signed)
Subjective: 78 y.o. returns the office today for painful, elongated, thickened toenails which she cannot trim herself as well as for calluses in the back of her heels.  Denies any redness or drainage around the nails/calluses.  She has no other concerns today.  Denies any acute changes since last appointment and no new complaints today. Denies any systemic complaints such as fevers, chills, nausea, vomiting.   PCP: Prince Solian, MD   Objective: AAO 3, NAD DP/PT pulses palpable, CRT less than 3 seconds Nails hypertrophic, dystrophic, elongated, brittle, discolored 10. There is tenderness overlying the nails 1-5 bilaterally. There is no surrounding erythema or drainage along the nail sites. No open lesions or pre-ulcerative lesions are identified. Hyperkeratotic lesions to posterior aspect of bilateral heels.  No ongoing ulceration drainage or any signs of infection. No other areas of tenderness bilateral lower extremities. No overlying edema, erythema, increased warmth. No pain with calf compression, swelling, warmth, erythema.  Assessment: Patient presents with symptomatic onychomycosis; Pre-ulcerative calluses  Plan: -Treatment options including alternatives, risks, complications were discussed -Nails sharply debrided 10 without complication/bleeding. -Lesion should be debrided x2 without any complications or bleeding.  Moisturizer daily. -Continue physical therapy for now. -Discussed daily foot inspection. If there are any changes, to call the office immediately.  -Follow-up in 3 months or sooner if any problems are to arise. In the meantime, encouraged to call the office with any questions, concerns, changes symptoms.  Celesta Gentile, DPM

## 2019-11-07 ENCOUNTER — Ambulatory Visit: Payer: Medicare Other | Admitting: Orthopedic Surgery

## 2019-11-10 DIAGNOSIS — M5090 Cervical disc disorder, unspecified, unspecified cervical region: Secondary | ICD-10-CM | POA: Insufficient documentation

## 2019-11-10 DIAGNOSIS — M62830 Muscle spasm of back: Secondary | ICD-10-CM | POA: Insufficient documentation

## 2019-11-10 DIAGNOSIS — R293 Abnormal posture: Secondary | ICD-10-CM | POA: Insufficient documentation

## 2019-11-14 ENCOUNTER — Other Ambulatory Visit: Payer: Self-pay

## 2019-11-14 ENCOUNTER — Encounter: Payer: Self-pay | Admitting: Orthopedic Surgery

## 2019-11-14 ENCOUNTER — Ambulatory Visit: Payer: Medicare PPO | Admitting: Orthopedic Surgery

## 2019-11-14 VITALS — Ht 64.0 in | Wt 139.0 lb

## 2019-11-14 DIAGNOSIS — M6702 Short Achilles tendon (acquired), left ankle: Secondary | ICD-10-CM | POA: Diagnosis not present

## 2019-11-14 DIAGNOSIS — M6701 Short Achilles tendon (acquired), right ankle: Secondary | ICD-10-CM | POA: Diagnosis not present

## 2019-11-15 ENCOUNTER — Encounter: Payer: Self-pay | Admitting: Orthopedic Surgery

## 2019-11-15 NOTE — Progress Notes (Signed)
Office Visit Note   Patient: Danielle Peters           Date of Birth: Feb 01, 1941           MRN: [id number] Visit Date: 11/14/2019              Requested by: Prince Solian, MD [address] [place],  [place] [address] PCP: Prince Solian, MD  Chief Complaint  Patient presents with  . Right Foot - Pain  . Left Foot - Pain      HPI: Patient is an 79 year old woman who is seen for evaluation for bilateral heel pain and bilateral forefoot pain.  She states she feels like there is pins sticking in her heels and forefoot.  She states she has been to [facility] radiographs were obtained she was advised she had arthritis and she tried Voltaren gel.  Patient states she feels like something can be done for her feet.   Assessment & Plan: Visit Diagnoses:  1. Short Achilles tendon (acquired), left ankle   2. Short Achilles tendon (acquired), right ankle     Plan: Patient was given instruction and demonstrated Achilles stretching.  Patient was also given a 9/16 inch heel lift to unload the Achilles tendon during activities of daily living.  Reevaluate in 4 weeks.  Follow-Up Instructions: Return in about 4 weeks (around 12/12/2019).   Ortho Exam  Patient is alert, oriented, no adenopathy, well-dressed, normal affect, normal respiratory effort. Examination patient has a good dorsalis pedis pulse bilaterally.  She does have tenderness to palpation at the insertion of the Achilles bilaterally but no palpable Achilles defect no Achilles nodules.  Patient states that her feet wake her up at night with pain.  Patient denies pain with walking.  Patient has a resting tremor in her hands.  With her knees extended she has significant Achilles contracture with dorsiflexion 20 degrees short of neutral of the ankle.  Imaging: No results found. No images are attached to the encounter.  Labs: Lab Results  Component Value Date   REPTSTATUS 08/04/2010 FINAL 08/02/2010   CULT   08/02/2010    Multiple bacterial morphotypes present, none predominant. Suggest appropriate recollection if clinically indicated.     Lab Results  Component Value Date   ALBUMIN 3.9 05/05/2010   ALBUMIN 4.2 01/24/2009   ALBUMIN 4.1 07/19/2008    Lab Results  Component Value Date   MG 2.2 11/16/2016   MG 2.1 01/17/2008   No results found for: VD25OH  No results found for: PREALBUMIN CBC EXTENDED Latest Ref Rng & Units 01/02/2016 01/27/2011 01/27/2011  WBC 4.0 - 10.5 K/uL 7.5 - 8.3  RBC 3.87 - 5.11 MIL/uL 4.90 - 4.64  HGB 12.0 - 15.0 g/dL 13.6 14.3 12.7  HCT 36.0 - 46.0 % 42.1 42.0 38.8  PLT 150 - 400 K/uL 162 - 164  NEUTROABS 1.7 - 7.7 K/uL - - 5.6  LYMPHSABS 0.7 - 4.0 K/uL - - 2.0     Body mass index is 23.86 kg/m.  Orders:  No orders of the defined types were placed in this encounter.  No orders of the defined types were placed in this encounter.    Procedures: No procedures performed  Clinical Data: No additional findings.  ROS:  All other systems negative, except as noted in the HPI. Review of Systems  Objective: Vital Signs: Ht 5\' 4"  (1.626 m)   Wt 139 lb (63 kg)   BMI 23.86 kg/m   Specialty Comments:  No  specialty comments available.  PMFS History: Patient Active Problem List   Diagnosis Date Noted  . 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 Ulcer of left heel, limited to breakdown of skin ([place]) 11/04/2017  . Seasonal and perennial allergic rhinitis 12/31/2016  . Hypercholesterolemia 12/01/2016  . Tremor 10/07/2016  . Left lumbar radiculopathy 10/07/2016  . 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 LBBB (left bundle branch block) 06/09/2014  . Paresthesia 03/30/2014  . Chest pain, atypical 12/09/2013  . Itching 06/28/2013  . Right leg pain 03/02/2013  . Essential hypertension 03/02/2013  . GLAUCOMA 02/27/2010  . Essential tremor 08/30/2009  . Bronchitis, chronic obstructive, with exacerbation  ([place]) 07/30/2008  . PULMONARY SARCOIDOSIS 11/04/2007  . Malignant neoplasm of upper outer quadrant of female breast ([place]) 11/04/2007  . ANEMIA 11/04/2007  . Anxiety state 11/04/2007  . Paroxysmal supraventricular tachycardia ([place]) 11/04/2007  . ESOPHAGEAL REFLUX 11/04/2007   Past Medical History:  Diagnosis Date  . Anemia   . Anxiety   . Breast cancer ([place]) 2004   right  . Cancer ([place])    breast ca  . Chest pain    echo 01/08/10- nl lv; mild aortic sclerosis; myoview 01/08/10- no ischemia  . Dyslipidemia    myaligia with statins  . Esophageal reflux   . Hypertension   . Personal history of chemotherapy 2004  . Personal history of radiation therapy 2004  . Pulmonary sarcoidosis ([place])   . Shingles 03/2016  . Tremor    upper extremities  . Ventricular tachycardia (paroxysmal) ([facility])    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; AV node reentry tachycardia ablation     Family History  Problem Relation Age of Onset  . Heart failure Father 68  . Cancer Mother   . Heart attack Sister        2 heart attacks  . Breast cancer Neg Hx     Past Surgical History:  Procedure Laterality Date  . Boil Right 03/2015   on buttock, excision  . BREAST LUMPECTOMY Right 2004  . left breast lumpectomy,chemo  2004   xrt  . SKIN BIOPSY    . UPPER GI ENDOSCOPY  02/2015   difficulty swallowing, Abd pain   Social History   Occupational History  . 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 Smoking status: Never Smoker  . Smokeless tobacco: Never Used  Substance and Sexual Activity  . Alcohol use: No  . Drug use: No  . Sexual activity: Not on file

## 2019-12-11 ENCOUNTER — Ambulatory Visit: Payer: Medicare PPO | Admitting: Orthopedic Surgery

## 2019-12-27 ENCOUNTER — Ambulatory Visit: Payer: Medicare Other | Admitting: Internal Medicine

## 2020-01-01 ENCOUNTER — Other Ambulatory Visit: Payer: Self-pay

## 2020-01-01 ENCOUNTER — Ambulatory Visit: Payer: Medicare Other | Admitting: Podiatry

## 2020-01-01 DIAGNOSIS — Q828 Other specified congenital malformations of skin: Secondary | ICD-10-CM | POA: Diagnosis not present

## 2020-01-01 DIAGNOSIS — G629 Polyneuropathy, unspecified: Secondary | ICD-10-CM

## 2020-01-01 DIAGNOSIS — M79676 Pain in unspecified toe(s): Secondary | ICD-10-CM | POA: Diagnosis not present

## 2020-01-01 DIAGNOSIS — B351 Tinea unguium: Secondary | ICD-10-CM | POA: Diagnosis not present

## 2020-01-01 NOTE — Progress Notes (Signed)
Subjective: 79 y.o. returns the office today for painful, elongated, thickened toenails which she cannot trim herself as well as for calluses in the back of her heels. She is doing PT and this has been helpful in regards to helping her walk better. Denies any systemic complaints such as fevers, chills, nausea, vomiting.   PCP: Prince Solian, MD   Objective: AAO 3, NAD DP/PT pulses palpable, CRT less than 3 seconds Nails hypertrophic, dystrophic, elongated, brittle, discolored 10. There is tenderness overlying the nails 1-5 bilaterally. There is no surrounding erythema or drainage along the nail sites. Hyperkeratotic lesions to posterior aspect of bilateral heels and distal toes.  No ongoing ulceration drainage or any signs of infection. No other areas of tenderness bilateral lower extremities. No overlying edema, erythema, increased warmth. No pain with calf compression, swelling, warmth, erythema.  Assessment: Patient presents with symptomatic onychomycosis; Pre-ulcerative calluses  Plan: -Treatment options including alternatives, risks, complications were discussed -Nails sharply debrided 10 without complication/bleeding. -Lesion should be debrided x4 without any complications or bleeding.  Moisturizer daily. -Discussed daily foot inspection. If there are any changes, to call the office immediately.  -Follow-up in 3 months or sooner if any problems are to arise. In the meantime, encouraged to call the office with any questions, concerns, changes symptoms.  Celesta Gentile, DPM

## 2020-01-24 DIAGNOSIS — H9193 Unspecified hearing loss, bilateral: Secondary | ICD-10-CM | POA: Diagnosis not present

## 2020-01-24 DIAGNOSIS — H6123 Impacted cerumen, bilateral: Secondary | ICD-10-CM | POA: Diagnosis not present

## 2020-01-24 DIAGNOSIS — J302 Other seasonal allergic rhinitis: Secondary | ICD-10-CM | POA: Diagnosis not present

## 2020-02-05 ENCOUNTER — Telehealth: Payer: Self-pay | Admitting: Cardiovascular Disease

## 2020-02-05 NOTE — Telephone Encounter (Signed)
Returned call to patient left message on personal voice mail ok for cousin Curly Shores to come back at your visit.

## 2020-02-05 NOTE — Telephone Encounter (Signed)
Patient states she is requesting to have her cousin, Curly Shores, accompany her during her appointment scheduled for 02/08/20 at 11:45 AM with Almyra Deforest due to issues with balance and vision. Please call to advise.

## 2020-02-07 ENCOUNTER — Other Ambulatory Visit: Payer: Self-pay | Admitting: Internal Medicine

## 2020-02-08 ENCOUNTER — Other Ambulatory Visit: Payer: Self-pay

## 2020-02-08 ENCOUNTER — Encounter: Payer: Self-pay | Admitting: Physician Assistant

## 2020-02-08 ENCOUNTER — Ambulatory Visit (INDEPENDENT_AMBULATORY_CARE_PROVIDER_SITE_OTHER): Payer: Medicare PPO | Admitting: Physician Assistant

## 2020-02-08 VITALS — BP 138/50 | HR 101 | Ht 63.5 in | Wt 135.8 lb

## 2020-02-08 DIAGNOSIS — I471 Supraventricular tachycardia, unspecified: Secondary | ICD-10-CM

## 2020-02-08 DIAGNOSIS — R0789 Other chest pain: Secondary | ICD-10-CM | POA: Diagnosis not present

## 2020-02-08 DIAGNOSIS — G250 Essential tremor: Secondary | ICD-10-CM | POA: Diagnosis not present

## 2020-02-08 DIAGNOSIS — I447 Left bundle-branch block, unspecified: Secondary | ICD-10-CM | POA: Diagnosis not present

## 2020-02-08 DIAGNOSIS — E7800 Pure hypercholesterolemia, unspecified: Secondary | ICD-10-CM

## 2020-02-08 DIAGNOSIS — I10 Essential (primary) hypertension: Secondary | ICD-10-CM

## 2020-02-08 DIAGNOSIS — D869 Sarcoidosis, unspecified: Secondary | ICD-10-CM

## 2020-02-08 MED ORDER — PRAVASTATIN SODIUM 40 MG PO TABS: 40.0000 mg | ORAL_TABLET | Freq: Every evening | ORAL | 3 refills | Status: DC

## 2020-02-08 NOTE — Progress Notes (Signed)
Cardiology Office Note:    Date:  02/10/2020   ID:  [place], DOB September 16, 1941, MRN [id number]  PCP:  Prince Solian, MD  Cardiologist:  Sanda Klein, MD  Electrophysiologist:  None   Referring MD: Prince Solian, MD   Chief Complaint  Patient presents with  . Follow-up    seen for Dr. Sallyanne Kuster    History of Present Illness:    Danielle Peters is a 79 y.o. 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  Myoview obtained in February 2018 showed EF 77%, hyperdynamic LV, otherwise normal perfusion.  CT of chest without contrast obtained on 08/01/2018 showed stable 5 mm subpleural left lower lobe lung nodule, mildly progressive pulmonary parenchymal finding of sarcoid, coronary artery calcification and aortic atherosclerosis.  Myoview obtained on 12/24/2018 showed EF 82%, no evidence of ischemia or infarction, overall normal study.  Patient presents today for cardiology office visit.  She still has occasional chest discomfort, however this is the same chest discomfort she had last year.  There has been no increase in frequency or duration of the chest discomfort.  I recommended continue observation and to call cardiology if her chest pain become more frequent or lasting longer.  Her baseline heart rate is mildly elevated today at 101, this mild tachycardia persisted even near the end of our visit.  Talking with her, she says normally her heart rate is around 80s at home.  I urged her to monitor her heart rate if it is persistently over 90 bpm, she need to give [place] a call and let us know.  If that is the case, I likely will restart her on a low-dose beta-blocker.  Otherwise she has no lower extremity edema, orthopnea or  PND.  She was started on low-dose Crestor 10 mg earlier this year however could not tolerate it due to muscle cramps.  We discussed about restarting her pravastatin last year which she has not tried, she was agreeable for this.  She can have fasting lipid panel and a liver function test in 2 to 3 months.  Past Medical History:  Diagnosis Date  . Anemia   . Anxiety   . Breast cancer ([place]) 2004   right  . Cancer ([place])    breast ca  . Chest pain    echo 01/08/10- nl lv; mild aortic sclerosis; myoview 01/08/10- no ischemia  . Dyslipidemia    myaligia with statins  . Esophageal reflux   . Hypertension   . Personal history of chemotherapy 2004  . Personal history of radiation therapy 2004  . Pulmonary sarcoidosis ([place])   . Shingles 03/2016  . Tremor    upper extremities  . Ventricular tachycardia (paroxysmal) ([facility])    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; AV node reentry tachycardia ablation     Past Surgical History:  Procedure Laterality Date  . Boil Right 03/2015   on buttock, excision  . BREAST LUMPECTOMY Right 2004  . left breast lumpectomy,chemo  2004   xrt  . SKIN BIOPSY    . UPPER GI ENDOSCOPY  02/2015   difficulty swallowing, Abd pain    Current Medications: Current Meds  Medication Sig  . aspirin 81 MG tablet Take 81 mg by mouth every other day.   . betamethasone dipropionate (DIPROLENE)  0.05 % cream Apply 1 application topically 2 (two) times daily.  [name] BREO ELLIPTA 100-25 MCG/INH AEPB INHALE 1 PUFF BY MOUTH EVERY DAY  . diazepam (VALIUM) 2 MG tablet Take 2 mg by mouth every 6 (six) hours as needed for anxiety.  . diclofenac sodium (VOLTAREN) 1 % GEL Apply 2 g topically 4 (four) times daily. Rub into affected area of foot 2 to 4 times daily  . fluticasone (FLONASE) 50 MCG/ACT nasal spray SPRAY 2 SPRAYS INTO EACH NOSTRIL EVERY DAY  . hydrocortisone 2.5 % lotion APPLY TOPICALLY TO THE FACE DAILY AS NEEDED.  . hydrocortisone valerate ointment (WEST-CORT) 0.2 % Add'l Sig Add'l Sig  topical Add'l Sig  . meclizine (ANTIVERT) 12.5 MG tablet Take 12.5 mg by mouth 3 (three) times daily as needed for dizziness.  [name] omeprazole (PRILOSEC) 20 MG capsule Take 20 mg by mouth 2 (two) times daily.  . ondansetron (ZOFRAN-ODT) 4 MG disintegrating tablet Take 4 mg by mouth every 8 (eight) hours as needed.  . primidone (MYSOLINE) 50 MG tablet Take 1 tablet (50 mg total) by mouth 2 (two) times daily.  [name] tiZANidine (ZANAFLEX) 2 MG tablet Take by mouth every 6 (six) hours as needed for muscle spasms.  [name] triamcinolone cream (KENALOG) 0.1 % APPLY TO AFFECTED AREA AS NEEDED AS DIRECTED  . valsartan-hydrochlorothiazide (DIOVAN-HCT) 80-12.5 MG per tablet Take 1 tablet by mouth daily.  . [DISCONTINUED] [employer] DIGESTIVE PROBIOTIC 250 MG capsule TAKE 1 CAPSULE EVERY DAY FOR 1 MONTH     Allergies:   Dextromethorphan polistirex er, Primidone, Albuterol, Amoxicillin, Asa [aspirin], Levaquin [levofloxacin], Loratadine, Nortriptyline, and Simvastatin   Social History   Socioeconomic History  . Marital status: Divorced    Spouse name: Not on file  . Number of children: 2  . Years of education: 32  . Highest education level: Not on file  Occupational History  . 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 Smoking status: Never Smoker  . Smokeless tobacco: Never Used  Substance and Sexual Activity  . Alcohol use: No  . Drug use: No  . Sexual activity: Not on file  Other Topics Concern  . Not on file  Social History Narrative   Patient lives at home alone. Patient is retired.Pateint has high school education. And went to [place].   Right handed.   Caffeine- one cup daily.   Social Determinants of Health   Financial Resource Strain:   . Difficulty of Paying Living Expenses:   Food Insecurity:   . Worried About Charity fundraiser in the Last Year:   . Arboriculturist in the Last Year:   Transportation Needs:   . Film/video editor (Medical):   [name] Lack of Transportation  (Non-Medical):   Physical Activity:   . Days of Exercise per Week:   . Minutes of Exercise per Session:   Stress:   . Feeling of Stress :   Social Connections:   . Frequency of Communication with Friends and Family:   . Frequency of Social Gatherings with Friends and Family:   . Attends Religious Services:   . Active Member of Clubs or Organizations:   . Attends Archivist Meetings:   [name] Marital Status:      Family History: The patient's family history includes Cancer in her mother; Heart attack in her sister; Heart failure (age of onset: 21) in her father. There is no history of Breast cancer.  ROS:   Please see the  history of present illness.     All other systems reviewed and are negative.  EKGs/Labs/Other Studies Reviewed:    The following studies were reviewed today:  Myoview 12/27/2018 Study Highlights   Nuclear stress EF: 82%. The left ventricular ejection fraction is hyperdynamic (>65%).  There was no ST segment deviation noted during stress.  This is a low risk study. No evidence of ischemia. There is no evidence of previous infarction  The study is normal.    EKG:  EKG is ordered today.  The ekg ordered today demonstrates normal sinus rhythm with left bundle branch block  Recent Labs: No results found for requested labs within last 8760 hours.  Recent Lipid Panel No results found for: CHOL, TRIG, HDL, CHOLHDL, VLDL, LDLCALC, LDLDIRECT  Physical Exam:    VS:  BP (!) 138/50   Pulse (!) 101   Ht 5' 3.5" (1.613 m)   Wt 135 lb 12.8 oz (61.6 kg)   BMI 23.68 kg/m     Wt Readings from Last 3 Encounters:  02/08/20 135 lb 12.8 oz (61.6 kg)  11/14/19 139 lb (63 kg)  08/24/19 139 lb 6.4 oz (63.2 kg)     GEN:  Well nourished, well developed in no acute distress HEENT: Normal NECK: No JVD; No carotid bruits LYMPHATICS: No lymphadenopathy CARDIAC: RRR, no murmurs, rubs, gallops RESPIRATORY:  Clear to auscultation without rales, wheezing or rhonchi    ABDOMEN: Soft, non-tender, non-distended MUSCULOSKELETAL:  No edema; No deformity  SKIN: Warm and dry NEUROLOGIC:  Alert and oriented x 3 PSYCHIATRIC:  Normal affect   ASSESSMENT:    1. Chest pain, atypical   2. Hypercholesterolemia   3. Essential hypertension   4. PULMONARY SARCOIDOSIS   5. Essential tremor   6. SVT (supraventricular tachycardia) ([place])   7. LBBB (left bundle branch block)    PLAN:    In order of problems listed above:  1. Atypical chest pain: Chest pain has been going on for several years now.  This is the same chest pain she had last year.  There is no increase in frequency or duration.  Myoview obtained in 2020 for the same chest discomfort shows no evidence of ischemia.  She did have coronary artery calcification seen on previous CT.  We will continue to observe and she has been instructed to contact cardiology if her chest pain increase in frequency or duration  2. Hypertension: Blood pressure stable  3. Hyperlipidemia: She was placed on Crestor 10 mg earlier this year, however was unable to tolerate this dose.  She wished to try the pravastatin again.  I will start her on the pravastatin and obtain fasting lipid panel and LFT in 2 to 3 months  4. Pulmonary sarcoidosis: Followed by Dr. Annamaria Boots  5. Essential tremor: Followed by Dr. Krista Blue  6. History of SVT s/p ablation procedure: No recent palpitation  7. Left bundle branch block: Chronic.  As mentioned above, she has history of coronary artery calcification on the previous CT, however Myoview obtained in 2020 was negative for ischemia or infarction.   Medication Adjustments/Labs and Tests Ordered: Current medicines are reviewed at length with the patient today.  Concerns regarding medicines are outlined above.  Orders Placed This Encounter  Procedures  . EKG 12-Lead   Meds ordered this encounter  Medications  . pravastatin (PRAVACHOL) 40 MG tablet    Sig: Take 1 tablet (40 mg total) by mouth every  evening.    Dispense:  90 tablet  Refill:  3    Patient Instructions  Medication Instructions:  START PRAVASTATIN 40 MG ONCE DAILY  *If you need a refill on your cardiac medications before your next appointment, please call your pharmacy*   Lab Work: Your physician recommends that you HAVE LAB [place] ' If you have labs (blood work) drawn today and your tests are completely normal, you will receive your results only by: [name] MyChart Message (if you have MyChart) OR . A paper copy in the mail If you have any lab test that is abnormal or we need to change your treatment, we will call you to review the results.   Follow-Up: At [facility], you and your health needs are our priority.  As part of our continuing mission to provide you with exceptional heart care, we have created designated Provider Care Teams.  These Care Teams include your primary Cardiologist (physician) and Advanced Practice Providers (APPs -  Physician Assistants and Nurse Practitioners) who all work together to provide you with the care you need, when you need it.  We recommend signing up for the patient portal called "MyChart".  Sign up information is provided on this After Visit Summary.  MyChart is used to connect with patients for Virtual Visits (Telemedicine).  Patients are able to view lab/test results, encounter notes, upcoming appointments, etc.  Non-urgent messages can be sent to your provider as well.   To learn more about what you can do with MyChart, go to [email or web address].    Your next appointment:   12 month(s)  The format for your next appointment:   Either In Person or Virtual  Provider:   You may see Sanda Klein, MD or one of the following Advanced Practice Providers on your designated Care Team:    Almyra Deforest, PA-C  Fabian Sharp, PA-C or   Roby Lofts, PA-C        Signed, [place], [place]  02/10/2020 9:48 PM    E. Lopez

## 2020-02-08 NOTE — Patient Instructions (Signed)
Medication Instructions:  START PRAVASTATIN 40 MG ONCE DAILY  *If you need a refill on your cardiac medications before your next appointment, please call your pharmacy*   Lab Work: Your physician recommends that you HAVE LAB [place] ' If you have labs (blood work) drawn today and your tests are completely normal, you will receive your results only by: [name] MyChart Message (if you have MyChart) OR . A paper copy in the mail If you have any lab test that is abnormal or we need to change your treatment, we will call you to review the results.   Follow-Up: At [facility], you and your health needs are our priority.  As part of our continuing mission to provide you with exceptional heart care, we have created designated Provider Care Teams.  These Care Teams include your primary Cardiologist (physician) and Advanced Practice Providers (APPs -  Physician Assistants and Nurse Practitioners) who all work together to provide you with the care you need, when you need it.  We recommend signing up for the patient portal called "MyChart".  Sign up information is provided on this After Visit Summary.  MyChart is used to connect with patients for Virtual Visits (Telemedicine).  Patients are able to view lab/test results, encounter notes, upcoming appointments, etc.  Non-urgent messages can be sent to your provider as well.   To learn more about what you can do with MyChart, go to [email or web address].    Your next appointment:   12 month(s)  The format for your next appointment:   Either In Person or Virtual  Provider:   You may see Sanda Klein, MD or one of the following Advanced Practice Providers on your designated Care Team:    Almyra Deforest, PA-C  Fabian Sharp, PA-C or   Roby Lofts, [place]

## 2020-02-10 ENCOUNTER — Encounter: Payer: Self-pay | Admitting: Physician Assistant

## 2020-02-13 DIAGNOSIS — N390 Urinary tract infection, site not specified: Secondary | ICD-10-CM | POA: Diagnosis not present

## 2020-03-26 ENCOUNTER — Other Ambulatory Visit: Payer: Self-pay

## 2020-03-26 ENCOUNTER — Ambulatory Visit: Payer: Medicare PPO | Admitting: Podiatry

## 2020-03-26 ENCOUNTER — Encounter: Payer: Self-pay | Admitting: Podiatry

## 2020-03-26 DIAGNOSIS — G629 Polyneuropathy, unspecified: Secondary | ICD-10-CM | POA: Diagnosis not present

## 2020-03-26 DIAGNOSIS — M79676 Pain in unspecified toe(s): Secondary | ICD-10-CM | POA: Diagnosis not present

## 2020-03-26 DIAGNOSIS — Q828 Other specified congenital malformations of skin: Secondary | ICD-10-CM | POA: Diagnosis not present

## 2020-03-26 DIAGNOSIS — B351 Tinea unguium: Secondary | ICD-10-CM | POA: Diagnosis not present

## 2020-03-26 MED ORDER — DICLOFENAC SODIUM 1 % EX GEL: 2.0000 g | Freq: Four times a day (QID) | CUTANEOUS | 2 refills | Status: DC

## 2020-03-27 DIAGNOSIS — L298 Other pruritus: Secondary | ICD-10-CM | POA: Diagnosis not present

## 2020-03-27 DIAGNOSIS — L308 Other specified dermatitis: Secondary | ICD-10-CM | POA: Diagnosis not present

## 2020-03-31 NOTE — Progress Notes (Signed)
Subjective: 79 y.o. returns the office today for painful, elongated, thickened toenails which she cannot trim herself as well as for calluses in the back of her heels.  Overall she states that she is doing well and she has no new concerns.  Denies any systemic complaints such as fevers, chills, nausea, vomiting.   PCP: Prince Solian, MD   Objective: AAO 3, NAD DP/PT pulses palpable, CRT less than 3 seconds Nails hypertrophic, dystrophic, elongated, brittle, discolored 10. There is tenderness overlying the nails 1-5 bilaterally. There is no surrounding erythema or drainage along the nail sites. Hyperkeratotic lesions to posterior aspect of bilateral heels and distal toes.  No underlying ulceration drainage or any signs of infection. No other areas of tenderness bilateral lower extremities. No overlying edema, erythema, increased warmth. No pain with calf compression, swelling, warmth, erythema.  Assessment: Symptomatic onychomycosis; Pre-ulcerative calluses  Plan: -Treatment options including alternatives, risks, complications were discussed -Nails sharply debrided 10 without complication/bleeding. -Lesion should be debrided x4 without any complications or bleeding.  Moisturizer daily. -Discussed daily foot inspection. If there are any changes, to call the office immediately.  -Follow-up in 3 months or sooner if any problems are to arise. In the meantime, encouraged to call the office with any questions, concerns, changes symptoms.  Celesta Gentile, DPM

## 2020-04-02 ENCOUNTER — Ambulatory Visit: Payer: Medicare PPO | Admitting: Podiatry

## 2020-04-10 ENCOUNTER — Other Ambulatory Visit: Payer: Self-pay | Admitting: Cardiovascular Disease

## 2020-04-16 DIAGNOSIS — R5383 Other fatigue: Secondary | ICD-10-CM | POA: Diagnosis not present

## 2020-04-16 DIAGNOSIS — R519 Headache, unspecified: Secondary | ICD-10-CM | POA: Diagnosis not present

## 2020-04-16 DIAGNOSIS — J329 Chronic sinusitis, unspecified: Secondary | ICD-10-CM | POA: Diagnosis not present

## 2020-04-16 DIAGNOSIS — J309 Allergic rhinitis, unspecified: Secondary | ICD-10-CM | POA: Diagnosis not present

## 2020-04-16 DIAGNOSIS — R110 Nausea: Secondary | ICD-10-CM | POA: Diagnosis not present

## 2020-05-07 ENCOUNTER — Telehealth: Payer: Self-pay | Admitting: Neurology

## 2020-05-07 NOTE — Telephone Encounter (Signed)
Patient's hands are getting worse trembling a lot more and sticking together. Danielle Peters has no openings

## 2020-05-07 NOTE — Telephone Encounter (Signed)
I called pt she is having increased tremors bilateral  R>L.  R fingers pinky and ring finger sticks together, she has to pull apart.  I did not have anything to offer SS/NP until 10/21. I offered JM/NP but she declined.  Placed on waitlist.  Wanted me to ask on gabapentin if you would consider this.  She is not taking gabapentin at this time.  Primidone 50mg  po BID.

## 2020-05-07 NOTE — Telephone Encounter (Signed)
At last visit med review indicated gabapentin 300 mg 3 times daily. If she was tolerating this, we can try something similar maybe just twice daily. Does Dr. Krista Blue have any openings for her?

## 2020-05-07 NOTE — Telephone Encounter (Signed)
Reviewed her chart, longstanding history of report of tremor bilateral hands resting and postural tremor. Felt most consistent with essential tremor, no parkinsonian features. Has tried primidone, clonazepam, metoprolol.  When last seen in November, primidone was continued 50 mg BID, could not tolerate 50/100 mg.  Was now taking gabapentin, could also benefit tremor.  She has appointment scheduled to see Dr. Krista Blue in November.  At last appointment, we talked about trying 50 mg 3 times daily primidone, she deferred. Does she think she needs more medication or she calling to report a change, you can see if you can move her appointment up any.

## 2020-05-08 ENCOUNTER — Other Ambulatory Visit: Payer: Self-pay

## 2020-05-08 ENCOUNTER — Encounter: Payer: Self-pay | Admitting: Neurology

## 2020-05-08 ENCOUNTER — Ambulatory Visit: Payer: Medicare PPO | Admitting: Neurology

## 2020-05-08 VITALS — BP 139/68 | HR 101 | Ht 63.0 in | Wt 131.0 lb

## 2020-05-08 DIAGNOSIS — G250 Essential tremor: Secondary | ICD-10-CM

## 2020-05-08 MED ORDER — PRIMIDONE 50 MG PO TABS: ORAL_TABLET | ORAL | 3 refills | Status: DC

## 2020-05-08 NOTE — Telephone Encounter (Signed)
I called pt and she will come in today at 1045 today (arrive 1030 at latest).  She would try and do her best.

## 2020-05-08 NOTE — Patient Instructions (Addendum)
Increase the primidone to 50 mg tablet, take 1 tablet in the morning, take 1.5 tablets in the evening  Check blood work today  See you back in 5 months

## 2020-05-08 NOTE — Progress Notes (Signed)
PATIENT: Danielle Peters DOB: 14-Apr-1941  REASON FOR VISIT: follow up HISTORY FROM: patient  HISTORY OF PRESENT ILLNESS: Today 05/08/20  HISTORY  Danielle Peters is a 79 year old right-handed American female, follow up for essential tremor   She has past medical history of hypertension, hyperlipidemia, anxiety, history of right breast cancer, status post lobectomy in 2004, followed by radiation and chemotherapy, she developed bilateral hands paresthesia following chemotherapy consistent with-induced peripheral neuropathy. She presenting with few years history of bilateral hands tremor, getting worse when she stretched out her hands, or using utensils, she has describe difficulty feeding herself, cause a lot of social embarrasement, she denies significant gait difficulty, she also has head titubation She denied bilateral lower exremity paresthesia weakness, but does complain fingertips numbness tingling. She denied loss of smell, REM sleep disorder, has anxiety symptoms, denied family history of tremor Over the past few years, we have tried primadone, while taking 50mg  1/2 once a day , there was no significant improvement at that time, she also complains of headaches, but also tried diazepam, Ativan, without significant improvement  UPDATE Oct 08 2014:  She is no longer taking primidone, she is taking clonazepam 0.5 mg half to one tablets every morning, which has helped her tremor, she complains of anxiety, but does not want to take more medications for it  UPDATE Oct 07 2016: She took clonazepam 0.5 milligrams half to 1 tablet as needed for tremor for a while without significant improvement, she continue have worsening bilateral hands resting and posturing tremor, occasionally involving bilateral lower extremity, she denies significant gait abnormality, she exercise regularly, she complains of recent onset of left-sided low back pain, radiating pain to left lower extremity, left  calf muscle. She is taking Lyrica 75 mg a day  She just healed from right arm shingles in June 2017, still has intermittent itching in her right arm and hand,  UPDATE January 05 2017: She complains of constant bilateral hand resting and posturing tremor, previously tried primidone, clonazepam with limited help, currently she is taking Valium 2 mg as needed, which helps her some, she previously took metoprolol low-dose did not notice any help for her tremor,  This is most consistent with essential tremor, there was no family history of similar disease, no parkinsonian features.  Her low back pain has much improved, she has stopped taking nortriptyline  UPDATE May 31 2018: Her older sister has parkinson's disease.She now noticed worsening bilateral hands tremor, previously could not tolerate clonazepam cause excessive drowsiness, beta-blocker, she did not notice any significant benefit, she is now taking primidone50 mg twice a day, higher dose would cause significant drowsiness, she denied loss of sense of smell, she denies significant gait abnormality.  Update August 24, 2019 SS: She remains on primidone 50 mg twice a day, in the morning, and at 3 pm.  She was unable to tolerate 100 mg dose at bedtime, due to feeling more shaky in the morning.  She feels over time the tremor in her hands has worsened, occasional head tremor tilt.  She does notice that when she does not take the primidone her tremor is worse.  The tremor in her hands is worse in the right than the left.  She says it does not interfere with her daily activity or function, she is able to eat, but has a hard time with soups.  She lives alone, drives a car without difficulty.  She has been put on gabapentin for leg pain, which may also  benefit her tremor.  She has not had any falls, but feels her balance isn't as good since she hasn't been able to get into the gym to do her exercises. Her older sister with Parkinson's disease, has  since passed away. She has routine follow-up with her PCP.  Update May 08, 2020 SS: Here today for work in visit, feels tremor is worsening over time in her hands, occasionally in her head. Tremor > in the right than left hand.  Affects fine motor skills, handwriting. Worries fingers sticking together with tremor.  On primidone 50 mg twice a day, know it helps.  No falls, no changes in the walking, no loss of taste.  Lives alone, drives a car.  She has significant resting and postural tremor components.  Worried about Parkinson's disease, her sister died from this. Dr. Krista Blue also stopped to check in on patient. She never took the gabapentin she was given for her feet.   REVIEW OF SYSTEMS: Out of a complete 14 system review of symptoms, the patient complains only of the following symptoms, and all other reviewed systems are negative.  Tremor  ALLERGIES: Allergies  Allergen Reactions   Dextromethorphan Polistirex Er Other (See Comments)    SOB   Primidone     Severe Headaches   Albuterol     REACTION: tachycardia   Amoxicillin Diarrhea and Nausea Only   Asa [Aspirin]     Adult dose   Levaquin [Levofloxacin] Other (See Comments)    Per patient, caused tendonitis.    Loratadine    Nortriptyline     Increase heart rate.   Simvastatin     Myalgias     HOME MEDICATIONS: Outpatient Medications Prior to Visit  Medication Sig Dispense Refill   aspirin 81 MG tablet Take 81 mg by mouth every other day.      betamethasone dipropionate (DIPROLENE) 0.05 % cream Apply 1 application topically 2 (two) times daily.     BREO ELLIPTA 100-25 MCG/INH AEPB INHALE 1 PUFF BY MOUTH EVERY DAY 60 each 0   diazepam (VALIUM) 2 MG tablet Take 2 mg by mouth every 6 (six) hours as needed for anxiety.     diclofenac sodium (VOLTAREN) 1 % GEL Apply 2 g topically 4 (four) times daily. Rub into affected area of foot 2 to 4 times daily 100 g 2   diclofenac Sodium (VOLTAREN) 1 % GEL Apply 2 g topically 4  (four) times daily. Rub into affected area of foot 2 to 4 times daily 100 g 2   fluticasone (FLONASE) 50 MCG/ACT nasal spray SPRAY 2 SPRAYS INTO EACH NOSTRIL EVERY DAY  11   hydrocortisone 2.5 % lotion APPLY TOPICALLY TO THE FACE DAILY AS NEEDED.     hydrocortisone valerate ointment (WEST-CORT) 0.2 % Add'l Sig Add'l Sig topical Add'l Sig     meclizine (ANTIVERT) 12.5 MG tablet Take 12.5 mg by mouth 3 (three) times daily as needed for dizziness.     omeprazole (PRILOSEC) 20 MG capsule Take 20 mg by mouth 2 (two) times daily.     ondansetron (ZOFRAN-ODT) 4 MG disintegrating tablet Take 4 mg by mouth every 8 (eight) hours as needed.     pravastatin (PRAVACHOL) 40 MG tablet Take 1 tablet (40 mg total) by mouth every evening. 90 tablet 3   primidone (MYSOLINE) 50 MG tablet Take 1 tablet (50 mg total) by mouth 2 (two) times daily. 180 tablet 3   tiZANidine (ZANAFLEX) 2 MG tablet Take by mouth every 6 (  six) hours as needed for muscle spasms.     triamcinolone cream (KENALOG) 0.1 % APPLY TO AFFECTED AREA AS NEEDED AS DIRECTED     valsartan-hydrochlorothiazide (DIOVAN-HCT) 80-12.5 MG per tablet Take 1 tablet by mouth daily.  4   No facility-administered medications prior to visit.    PAST MEDICAL HISTORY: Past Medical History:  Diagnosis Date   Anemia    Anxiety    Breast cancer ([place]) 2004   right   Cancer [facility])    breast ca   Chest pain    echo 01/08/10- nl lv; mild aortic sclerosis; myoview 01/08/10- no ischemia   Dyslipidemia    myaligia with statins   Esophageal reflux    Hypertension    Personal history of chemotherapy 2004   Personal history of radiation therapy 2004   Pulmonary sarcoidosis ([place])    Shingles 03/2016   Tremor    upper extremities   Ventricular tachycardia (paroxysmal) (HCC)    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; AV node reentry tachycardia ablation     PAST SURGICAL HISTORY: Past Surgical History:  Procedure Laterality Date   Boil Right 03/2015    on buttock, excision   BREAST LUMPECTOMY Right 2004   left breast lumpectomy,chemo  2004   xrt   SKIN BIOPSY     UPPER GI ENDOSCOPY  02/2015   difficulty swallowing, Abd pain    FAMILY HISTORY: Family History  Problem Relation Age of Onset   Heart failure Father 107   Cancer Mother    Heart attack Sister        2 heart attacks   Breast cancer Neg Hx     SOCIAL HISTORY: Social History   Socioeconomic History   Marital status: Divorced    Spouse name: Not on file   Number of children: 2   Years of education: 12   Highest education level: Not on file  Occupational History   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 Patient is retired.Pateint has high school education. And went to [employer].   Right handed.   Caffeine- one cup daily.   Social Determinants of Health   Financial Resource Strain:    Difficulty of Paying Living Expenses:   Food Insecurity:    Worried About Charity fundraiser in the Last Year:    Arboriculturist in the Last Year:   Transportation Needs:    Film/video editor (Medical):    Lack of Transportation (Non-Medical):   Physical Activity:    Days of Exercise per Week:    Minutes of Exercise per Session:   Stress:    Feeling of Stress :   Social Connections:    Frequency of Communication with Friends and Family:    Frequency of Social Gatherings with Friends and Family:    Attends Religious Services:    Active Member of Clubs or Organizations:    Attends Music therapist:    Marital Status:   Intimate Partner Violence:    Fear of Current or Ex-Partner:    Emotionally Abused:    Physically Abused:    Sexually Abused:     PHYSICAL EXAM  Vitals:  05/08/20 1042  BP: 139/68  Pulse: (!) 101  Weight: 131 lb (59.4 kg)  Height: 5\' 3"  (1.6 m)   Body mass index is 23.21 kg/m.  Generalized: Well developed, in no acute distress   Neurological examination  Mentation: Alert oriented to time, place, history taking. Follows all commands speech and language fluent Cranial nerve II-XII: Pupils were equal round reactive to light. Extraocular movements were full, visual field were full on confrontational test. Facial sensation and strength were normal. Head turning and shoulder shrug  were normal and symmetric. No head tremor noted, slight vocal tremor.  Motor: The motor testing reveals 5 over 5 strength of all 4 extremities. Good symmetric motor tone is noted throughout.  Bilateral upper extremity resting and postural tremor, right greater than left, no significant rigidity with reinforcement, no bradykinesia noted Sensory: Sensory testing is intact to soft touch on all 4 extremities. No evidence of extinction is noted.  Coordination: Cerebellar testing reveals good finger-nose-finger and heel-to-shin bilaterally.  Gait and station: Able to stand from seated position with arms crossed but slowly, gait is slightly wide-based, fairly good strides, stable turns, decreased arm swing on the right, right resting tremor noted with walking Reflexes: Deep tendon reflexes are symmetric and normal bilaterally.   DIAGNOSTIC DATA (LABS, IMAGING, TESTING) - I reviewed patient records, labs, notes, testing and imaging myself where available.  Lab Results  Component Value Date   WBC 7.5 01/02/2016   HGB 13.6 01/02/2016   HCT 42.1 01/02/2016   MCV 85.9 01/02/2016   PLT 162 01/02/2016      Component Value Date/Time   NA 139 11/16/2016 1001   K 4.2 11/16/2016 1001   CL 99 11/16/2016 1001   CO2 28 11/16/2016 1001   GLUCOSE 102 (H) 11/16/2016 1001   BUN 15 11/16/2016 1001   CREATININE 0.93 11/16/2016 1001   CALCIUM 10.5 (H)  11/16/2016 1001   PROT 7.6 05/05/2010 1438   ALBUMIN 3.9 05/05/2010 1438   AST 29 05/05/2010 1438   ALT 19 05/05/2010 1438   ALKPHOS 105 05/05/2010 1438   BILITOT 1.0 05/05/2010 1438   GFRNONAA 45 (L) 01/02/2016 1058   GFRAA 52 (L) 01/02/2016 1058   No results found for: CHOL, HDL, LDLCALC, LDLDIRECT, TRIG, CHOLHDL No results found for: HGBA1C No results found for: VITAMINB12 Lab Results  Component Value Date   TSH 0.99 11/16/2016    ASSESSMENT AND PLAN 79 y.o. year old female  has a past medical history of Anemia, Anxiety, Breast cancer ([place]) (2004), Cancer [facility]), Chest pain, Dyslipidemia, Esophageal reflux, Hypertension, Personal history of chemotherapy (2004), Personal history of radiation therapy (2004), Pulmonary sarcoidosis ([place]), Shingles (03/2016), Tremor, and Ventricular tachycardia (paroxysmal) ([place]). here with:  1.  Essential tremor -Resting and postural component bilateral upper extremities, still felt to be ET vs PD, no signs of bradykinesia, or significant rigidity -Increase primidone 50 mg am/75 mg pm (reportedly previously cannot tolerate 50/100 mg dosing), asked pharmacy to cut tablets for her -Previously tried and failed clonazepam, beta-blocker, never took gabapentin, we may try this low dose in future -Check blood work today  -Follow-up in 5 months or sooner if needed, Dr. Krista Blue also saw patient today, not felt to be PD at this time, however may consider DaTscan in the future  I spent 30 minutes of face-to-face and non-face-to-face time with patient.  This included previsit chart review, lab review, study review, order entry, electronic health record documentation, patient education.  Butler Denmark, AGNP-C, DNP 05/08/2020, 10:49  AM [facility] [address], [place], [place] [address] [phone]

## 2020-05-10 LAB — COMPREHENSIVE METABOLIC PANEL
ALT: 46 IU/L — ABNORMAL HIGH (ref 0–32)
AST: 65 IU/L — ABNORMAL HIGH (ref 0–40)
Albumin/Globulin Ratio: 1.8 (ref 1.2–2.2)
Albumin: 4.7 g/dL (ref 3.7–4.7)
Alkaline Phosphatase: 162 IU/L — ABNORMAL HIGH (ref 48–121)
BUN/Creatinine Ratio: 14 (ref 12–28)
BUN: 12 mg/dL (ref 8–27)
Bilirubin Total: 0.3 mg/dL (ref 0.0–1.2)
CO2: 26 mmol/L (ref 20–29)
Calcium: 9.9 mg/dL (ref 8.7–10.3)
Chloride: 100 mmol/L (ref 96–106)
Creatinine, Ser: 0.85 mg/dL (ref 0.57–1.00)
GFR calc Af Amer: 75 mL/min/{1.73_m2} (ref >59)
GFR calc non Af Amer: 65 mL/min/{1.73_m2} (ref >59)
Globulin, Total: 2.6 g/dL (ref 1.5–4.5)
Glucose: 97 mg/dL (ref 65–99)
Potassium: 4.4 mmol/L (ref 3.5–5.2)
Sodium: 143 mmol/L (ref 134–144)
Total Protein: 7.3 g/dL (ref 6.0–8.5)

## 2020-05-10 LAB — CBC WITH DIFFERENTIAL/PLATELET
Basophils Absolute: 0 10*3/uL (ref 0.0–0.2)
Basos: 1 %
EOS (ABSOLUTE): 0.1 10*3/uL (ref 0.0–0.4)
Eos: 2 %
Hematocrit: 40.9 % (ref 34.0–46.6)
Hemoglobin: 13.5 g/dL (ref 11.1–15.9)
Immature Grans (Abs): 0 10*3/uL (ref 0.0–0.1)
Immature Granulocytes: 0 %
Lymphocytes Absolute: 1.7 10*3/uL (ref 0.7–3.1)
Lymphs: 29 %
MCH: 27.7 pg (ref 26.6–33.0)
MCHC: 33 g/dL (ref 31.5–35.7)
MCV: 84 fL (ref 79–97)
Monocytes Absolute: 0.6 10*3/uL (ref 0.1–0.9)
Monocytes: 10 %
Neutrophils Absolute: 3.6 10*3/uL (ref 1.4–7.0)
Neutrophils: 58 %
Platelets: 181 10*3/uL (ref 150–450)
RBC: 4.88 x10E6/uL (ref 3.77–5.28)
RDW: 13.3 % (ref 11.7–15.4)
WBC: 6 10*3/uL (ref 3.4–10.8)

## 2020-05-10 LAB — PRIMIDONE, SERUM
Phenobarbital, Serum: 3 ug/mL — ABNORMAL LOW (ref 15–40)
Primidone Lvl: 4.4 ug/mL — ABNORMAL LOW (ref 5.0–12.0)

## 2020-05-16 DIAGNOSIS — H3552 Pigmentary retinal dystrophy: Secondary | ICD-10-CM | POA: Diagnosis not present

## 2020-05-16 DIAGNOSIS — H401131 Primary open-angle glaucoma, bilateral, mild stage: Secondary | ICD-10-CM | POA: Diagnosis not present

## 2020-05-17 DIAGNOSIS — E7849 Other hyperlipidemia: Secondary | ICD-10-CM | POA: Diagnosis not present

## 2020-05-17 DIAGNOSIS — Z0000 Encounter for general adult medical examination without abnormal findings: Secondary | ICD-10-CM | POA: Diagnosis not present

## 2020-05-17 DIAGNOSIS — I10 Essential (primary) hypertension: Secondary | ICD-10-CM | POA: Diagnosis not present

## 2020-05-24 DIAGNOSIS — F39 Unspecified mood [affective] disorder: Secondary | ICD-10-CM | POA: Diagnosis not present

## 2020-05-24 DIAGNOSIS — C50919 Malignant neoplasm of unspecified site of unspecified female breast: Secondary | ICD-10-CM | POA: Diagnosis not present

## 2020-05-24 DIAGNOSIS — R251 Tremor, unspecified: Secondary | ICD-10-CM | POA: Diagnosis not present

## 2020-05-24 DIAGNOSIS — J449 Chronic obstructive pulmonary disease, unspecified: Secondary | ICD-10-CM | POA: Diagnosis not present

## 2020-05-24 DIAGNOSIS — E785 Hyperlipidemia, unspecified: Secondary | ICD-10-CM | POA: Diagnosis not present

## 2020-05-24 DIAGNOSIS — J309 Allergic rhinitis, unspecified: Secondary | ICD-10-CM | POA: Diagnosis not present

## 2020-05-24 DIAGNOSIS — I10 Essential (primary) hypertension: Secondary | ICD-10-CM | POA: Diagnosis not present

## 2020-05-24 DIAGNOSIS — I471 Supraventricular tachycardia: Secondary | ICD-10-CM | POA: Diagnosis not present

## 2020-05-24 DIAGNOSIS — Z0000 Encounter for general adult medical examination without abnormal findings: Secondary | ICD-10-CM | POA: Diagnosis not present

## 2020-05-29 ENCOUNTER — Other Ambulatory Visit: Payer: Self-pay | Admitting: Internal Medicine

## 2020-05-29 DIAGNOSIS — Z1231 Encounter for screening mammogram for malignant neoplasm of breast: Secondary | ICD-10-CM

## 2020-05-30 DIAGNOSIS — B354 Tinea corporis: Secondary | ICD-10-CM | POA: Diagnosis not present

## 2020-06-03 DIAGNOSIS — E785 Hyperlipidemia, unspecified: Secondary | ICD-10-CM | POA: Diagnosis not present

## 2020-06-07 ENCOUNTER — Other Ambulatory Visit: Payer: Self-pay

## 2020-06-07 ENCOUNTER — Ambulatory Visit: Payer: Medicare PPO | Admitting: Podiatry

## 2020-06-07 DIAGNOSIS — B351 Tinea unguium: Secondary | ICD-10-CM | POA: Diagnosis not present

## 2020-06-07 DIAGNOSIS — M79671 Pain in right foot: Secondary | ICD-10-CM

## 2020-06-07 DIAGNOSIS — M79676 Pain in unspecified toe(s): Secondary | ICD-10-CM | POA: Diagnosis not present

## 2020-06-07 DIAGNOSIS — L84 Corns and callosities: Secondary | ICD-10-CM

## 2020-06-07 DIAGNOSIS — M79672 Pain in left foot: Secondary | ICD-10-CM

## 2020-06-08 ENCOUNTER — Encounter: Payer: Self-pay | Admitting: Podiatry

## 2020-06-08 NOTE — Progress Notes (Addendum)
Subjective:  Patient ID: Danielle Peters, female    DOB: 1941-09-11,  MRN: [id number]  79 y.o. female presents with painful thick toenails that are difficult to trim. Pain interferes with ambulation. Aggravating factors include wearing enclosed shoe gear. Pain is relieved with periodic professional debridement..    Review of Systems: Negative except as noted in the HPI.  Past Medical History:  Diagnosis Date  . Anemia   . Anxiety   . Breast cancer ([place]) 2004   right  . Cancer ([place])    breast ca  . Chest pain    echo 01/08/10- nl lv; mild aortic sclerosis; myoview 01/08/10- no ischemia  . Dyslipidemia    myaligia with statins  . Esophageal reflux   . Hypertension   . Personal history of chemotherapy 2004  . Personal history of radiation therapy 2004  . Pulmonary sarcoidosis ([place])   . Shingles 03/2016  . Tremor    upper extremities  . Ventricular tachycardia (paroxysmal) (HCC)    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; AV node reentry tachycardia ablation    Past Surgical History:  Procedure Laterality Date  . Boil Right 03/2015   on buttock, excision  . BREAST LUMPECTOMY Right 2004  . left breast lumpectomy,chemo  2004   xrt  . SKIN BIOPSY    . UPPER GI ENDOSCOPY  02/2015   difficulty swallowing, Abd pain   Patient Active Problem List   Diagnosis Date Noted  . Seasonal allergic rhinitis 01/24/2020  . Bilateral hearing loss 09/21/2018  . Bilateral impacted cerumen 09/21/2018  . 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 Ulcer of left heel, limited to breakdown of skin ([place]) 11/04/2017  . Seasonal and perennial allergic rhinitis 12/31/2016  . Hypercholesterolemia 12/01/2016  . Tremor 10/07/2016  . Left lumbar radiculopathy 10/07/2016  . 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 LBBB (left bundle branch block) 06/09/2014  . Paresthesia 03/30/2014  . Chest pain, atypical 12/09/2013  . Itching 06/28/2013  . Right leg pain 03/02/2013   . Essential hypertension 03/02/2013  . GLAUCOMA 02/27/2010  . Essential tremor 08/30/2009  . Bronchitis, chronic obstructive, with exacerbation ([place]) 07/30/2008  . PULMONARY SARCOIDOSIS 11/04/2007  . Malignant neoplasm of upper outer quadrant of female breast ([place]) 11/04/2007  . ANEMIA 11/04/2007  . Anxiety state 11/04/2007  . Paroxysmal supraventricular tachycardia ([place]) 11/04/2007  . ESOPHAGEAL REFLUX 11/04/2007    Current Outpatient Medications:  .  aspirin 81 MG tablet, Take 81 mg by mouth every other day. , Disp: , Rfl:  .  betamethasone dipropionate (DIPROLENE) 0.05 % cream, Apply 1 application topically 2 (two) times daily., Disp: , Rfl:  .  BREO ELLIPTA 100-25 MCG/INH AEPB, INHALE 1 PUFF BY MOUTH EVERY DAY, Disp: 60 each, Rfl: 0 .  diazepam (VALIUM) 2 MG tablet, Take 2 mg by mouth every 6 (six) hours as needed for anxiety., Disp: , Rfl:  .  diclofenac sodium (VOLTAREN) 1 % GEL, Apply 2 g topically 4 (four) times daily. Rub into affected area of foot 2 to 4 times daily, Disp: 100 g, Rfl: 2 .  diclofenac Sodium (VOLTAREN) 1 % GEL, Apply 2 g topically 4 (four) times daily. Rub into affected area of foot 2 to 4 times daily, Disp: 100 g, Rfl: 2 .  econazole nitrate 1 % cream, SMARTSIG:Sparingly Topical Daily, Disp: , Rfl:  .  fluticasone (FLONASE) 50 MCG/ACT nasal spray, SPRAY 2 SPRAYS INTO EACH NOSTRIL EVERY DAY, Disp: , Rfl: 11 .  hydrocortisone 2.5 %  lotion, APPLY TOPICALLY TO THE FACE DAILY AS NEEDED., Disp: , Rfl:  .  hydrocortisone valerate ointment (WEST-CORT) 0.2 %, Add'l Sig Add'l Sig topical Add'l Sig, Disp: , Rfl:  .  ketoconazole (NIZORAL) 2 % cream, , Disp: , Rfl:  .  meclizine (ANTIVERT) 12.5 MG tablet, Take 12.5 mg by mouth 3 (three) times daily as needed for dizziness., Disp: , Rfl:  .  omeprazole (PRILOSEC) 20 MG capsule, Take 20 mg by mouth 2 (two) times daily., Disp: , Rfl:  .  ondansetron (ZOFRAN-ODT) 4 MG disintegrating tablet, Take 4 mg by mouth every 8 (eight)  hours as needed., Disp: , Rfl:  .  primidone (MYSOLINE) 50 MG tablet, Take 1 in the morning, take 1.5 tablets at night, Disp: 220 tablet, Rfl: 3 .  tiZANidine (ZANAFLEX) 2 MG tablet, Take by mouth every 6 (six) hours as needed for muscle spasms., Disp: , Rfl:  .  triamcinolone cream (KENALOG) 0.1 %, APPLY TO AFFECTED AREA AS NEEDED AS DIRECTED, Disp: , Rfl:  .  valsartan-hydrochlorothiazide (DIOVAN-HCT) 80-12.5 MG per tablet, Take 1 tablet by mouth daily., Disp: , Rfl: 4 .  pravastatin (PRAVACHOL) 40 MG tablet, Take 1 tablet (40 mg total) by mouth every evening., Disp: 90 tablet, Rfl: 3 Allergies  Allergen Reactions  . Dextromethorphan Polistirex Er Other (See Comments)    SOB  . Primidone     Severe Headaches  . Albuterol     REACTION: tachycardia  . Amoxicillin Diarrhea and Nausea Only  . Asa [Aspirin]     Adult dose  . Levaquin [Levofloxacin] Other (See Comments)    Per patient, caused tendonitis.   . Loratadine   . Nortriptyline     Increase heart rate.  . Simvastatin     Myalgias    Social History   Occupational History  . 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 Smoking status: Never Smoker  . Smokeless tobacco: Never Used  Substance and Sexual Activity  . Alcohol use: No  . Drug use: No  . Sexual activity: Not on file    Objective:   Constitutional Pt is a pleasant 79 y.o. African American female WD, WN in NAD. AAO x 3.   Vascular Capillary fill time to digits <3 seconds b/l lower extremities. Palpable pedal pulses b/l LE. Pedal hair absent. Lower extremity skin temperature gradient within normal limits. No pain with calf compression b/l. No edema noted b/l lower extremities. No cyanosis or clubbing noted.  Neurologic Normal speech. Oriented to person, place, and time. Protective sensation intact 5/5 intact bilaterally with 10g monofilament b/l. Clonus negative b/l.  Dermatologic Pedal skin with normal turgor, texture and tone bilaterally. No open  wounds bilaterally. No interdigital macerations bilaterally. Toenails 1-5 b/l elongated, discolored, dystrophic, thickened, crumbly with subungual debris and tenderness to dorsal palpation. Hyperkeratotic lesion(s) distal aspect b/l hallux lateral aspect of right foot and posteromedial aspect of left heel.  No erythema, no edema, no drainage, no flocculence.  Orthopedic: Normal muscle strength 5/5 to all lower extremity muscle groups bilaterally. No pain crepitus or joint limitation noted with ROM b/l. No gross bony deformities bilaterally. Patient ambulates independent of any assistive aids.   Radiographs: None Assessment:   1. Pain due to onychomycosis of toenail   2. Callus   3. Pain in both feet    Plan:  Patient was evaluated and treated and all questions answered.  Onychomycosis with pain -Nails palliatively debridement as below. -Educated on self-care  Procedure: Nail Debridement  Rationale: Pain Type of Debridement: manual, sharp debridement. Instrumentation: Nail nipper, rotary burr. Number of Nails: 10  -Examined patient. -No new findings. No new orders. -Toenails 1-5 b/l were debrided in length and girth with sterile nail nippers and dremel without iatrogenic bleeding.  -Callus(es) distal tip b/l hallux, lateral aspect of right foot and posteromedial aspect of left hee pared utilizing sterile scalpel blade without complication or incident. Total number debrided =4. -Dispensed two digital toe caps for daily use for b/l hallux. Apply every morning. Remove every evening. -Patient to report any pedal injuries to medical professional immediately. -Patient to continue soft, supportive shoe gear daily. -Patient/POA to call should there be question/concern in the interim.  Return in about 3 months (around 09/07/2020).  Marzetta Board, DPM

## 2020-06-20 ENCOUNTER — Ambulatory Visit: Payer: Medicare PPO

## 2020-06-25 DIAGNOSIS — L989 Disorder of the skin and subcutaneous tissue, unspecified: Secondary | ICD-10-CM | POA: Diagnosis not present

## 2020-06-25 DIAGNOSIS — D485 Neoplasm of uncertain behavior of skin: Secondary | ICD-10-CM | POA: Diagnosis not present

## 2020-07-05 ENCOUNTER — Ambulatory Visit: Payer: Medicare PPO

## 2020-07-23 ENCOUNTER — Ambulatory Visit
Admission: RE | Admit: 2020-07-23 | Discharge: 2020-07-23 | Disposition: A | Discharge status: Home / Self Care | Payer: Medicare PPO | Source: Ambulatory Visit | Attending: Internal Medicine | Admitting: Internal Medicine

## 2020-07-23 ENCOUNTER — Telehealth: Payer: Self-pay | Admitting: Internal Medicine

## 2020-07-23 ENCOUNTER — Other Ambulatory Visit: Payer: Self-pay

## 2020-07-23 DIAGNOSIS — Z1231 Encounter for screening mammogram for malignant neoplasm of breast: Secondary | ICD-10-CM

## 2020-07-23 NOTE — Telephone Encounter (Signed)
Called and spoke with pt letting her know the info stated by CY and that we could order her a nebulizer machine and sol for her to do breathing tx at home since we are not currently doing them in the office due to covid. Pt stated she did not need to have a nebulizer and sol ordered at this time. Pt said she would like to schedule an OV with CY. Pt has been scheduled an appt with CY at his first avail on a held slot as this was okayed by CY. Nothing further needed.

## 2020-07-23 NOTE — Telephone Encounter (Signed)
Spoke with pt. States that she is not feeling well. Reports sinus congestion, coughing and shortness of breath. Cough is producing a minimal amount of clear mucus. Denies chest tightness, wheezing, fever or sick contacts. Feels like this is her allergies acting up. Pt would like an appointment with CY.  CY - please advise. Thanks.  Allergies  Allergen Reactions  . Dextromethorphan Polistirex Er Other (See Comments)    SOB  . Primidone     Severe Headaches  . Albuterol     REACTION: tachycardia  . Amoxicillin Diarrhea and Nausea Only  . Asa [Aspirin]     Adult dose  . Levaquin [Levofloxacin] Other (See Comments)    Per patient, caused tendonitis.   . Loratadine   . Nortriptyline     Increase heart rate.  . Simvastatin     Myalgias

## 2020-07-23 NOTE — Telephone Encounter (Signed)
We are not giving breathing treatments here due to Covid restrictions.  Ok to order for her a compressor nebulizer machine  and Duoneb neb solution, 75 ml, ref x 12     1 neb every 6 hours if needed   If she still wants to be seen, we can offer her a held spot with me, or she can see an APP.

## 2020-07-30 ENCOUNTER — Ambulatory Visit (INDEPENDENT_AMBULATORY_CARE_PROVIDER_SITE_OTHER): Payer: Medicare PPO

## 2020-07-30 ENCOUNTER — Ambulatory Visit: Payer: Medicare PPO | Admitting: Internal Medicine

## 2020-07-30 ENCOUNTER — Other Ambulatory Visit: Payer: Self-pay

## 2020-07-30 VITALS — BP 118/60 | HR 104 | Temp 97.2°F | Ht 63.25 in | Wt 133.6 lb

## 2020-07-30 DIAGNOSIS — R059 Cough, unspecified: Secondary | ICD-10-CM

## 2020-07-30 DIAGNOSIS — D869 Sarcoidosis, unspecified: Secondary | ICD-10-CM

## 2020-07-30 DIAGNOSIS — R21 Rash and other nonspecific skin eruption: Secondary | ICD-10-CM | POA: Diagnosis not present

## 2020-07-30 MED ORDER — METHYLPREDNISOLONE ACETATE 80 MG/ML IJ SUSP
80.0000 mg | Freq: Once | INTRAMUSCULAR | Status: AC
  Administered 2020-07-30: 80 mg via INTRAMUSCULAR

## 2020-07-30 NOTE — Patient Instructions (Signed)
Order- CXR    Dx sarcoid  Order- depo 80  Dx sarcoid,chonic cough  Ok to use otc Delsym or Robitussin DM cough syrup  Please call if we can help

## 2020-07-30 NOTE — Progress Notes (Signed)
HPI  Female never smoker followed for sarcoid, chronic bronchitis complicated by history of right breast cancer/ XRT, tachycardia, GERD, anxiety, glaucoma, tremor.[name]  PCP Dr Lucien Mons Med Cardiac Study 11/25/16- EF 77%, hyperdynamic, otw  WNL Office Spirometry 12/31/2016-mild restriction of exhaled volume. FVC 1.61/77%, FEV1 1.37/86%, ratio 0.85, FEF 25-75% 2.00/ 144% -----------------------------------------------------------------------------------------.    08/23/2019- 79 year old female never smoker followed for Sarcoid, Chronic Bronchitis, accompanied by history right breast CA/XRT, Tachycardia, GERD, anxiety, Glaucoma, tremor Treated by PCP for pneumonia 4 weeks ago- tested neg for Covid then. Breo 100, Spiriva Respimat,  -----6 month f/u for COPD and PNA. States she is still recovering from PNA 4 weeks ago.   She says levaquin for pnuemonia caused tendonitis in her arm. Feels run down. Cough may be close to her baseline. Minimal throat soreness.  Spiriva Respimat too hard for her tremulous hands so she hasn't been using it.   07/30/20- 79 year old female never smoker followed for Sarcoid, Chronic Bronchitis, accompanied by history right breast CA/XRT, Tachycardia, GERD, anxiety, Glaucoma, Tremor Breo 100, Spiriva Respimat, Flonase,  Covid vax- 2 Phizer Flu vax- wtw till after booster -----  1 yr f/u - c/o dry cough  worse in the afternoon for past 2 months - SOB about the same - Occas wheezing Recent phone call we offered her a nebulizer machine with Duoneb but she didn't feel she needed to have it.  Increased cough over past 2 months, dry or scant white. No fever. Tremor is worse over time and makes it hard to use metered inhalers. Benzonatate no help for cough. Known substantial scarring from sarcoid.  ROS- see HPI  + = positive Constitutional:   No-   weight loss, night sweats, fevers, chills,+ fatigue, lassitude. HEENT:   No-  headaches, difficulty swallowing, tooth/dental problems,  sore throat,       No-  sneezing, itching, ear ache, nasal congestion, +post nasal drip,  CV:   chest pain, orthopnea, PND, swelling in lower extremities, anasarca, dizziness, palpitations Resp: No-   shortness of breath with exertion or at rest.              productive cough,  + non-productive cough,  No-  coughing up of blood.              No-   change in color of mucus.  No- wheezing.   Skin: no-pruritus or rash  GI:  No-   heartburn, indigestion, abdominal pain, nausea, vomiting,  GU:. MS:  No-   joint pain or swelling.  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 No depression or anxiety.  No memory loss.  Obj General- Alert, Oriented, Affect-appropriate, Distress- none acute,                     + Always slumped, almost kyphotic posture Skin- + hyperpigmentation right forearm attributed to shingles Lymphadenopathy- none Head- atraumatic            Eyes- Gross vision intact, PERRLA, conjunctivae clear secretions            Ears- Hearing, canals-normal            Nose- Clear, no-Septal dev, mucus, polyps, erosion, perforation             Throat- Mallampati II , mucosa clear , drainage- none, tonsils- atrophic. + Dentures Neck- flexible , trachea midline, no stridor , thyroid nl, carotid no bruit Chest - symmetrical excursion ,  unlabored           Heart/CV- RRR with skipped beats , no murmur , no gallop  , no rub, nl s1 s2                - JVD- none , edema- none, stasis changes- none, varices- none           Lung- + inspiratory squeaks diffuse ,  Work of breathing is not increased.   cough- none , dullness-none, rub- none           Chest wall- +treatment Scar R breast, Abd- No HSM Br/ Gen/ Rectal- Not done, not indicated Extrem- cyanosis- none, clubbing, none, atrophy- none, strength- nl Neuro- + significant tremor hands

## 2020-08-01 NOTE — Progress Notes (Signed)
Routed to [facility] normal results pool.

## 2020-08-04 ENCOUNTER — Encounter: Payer: Self-pay | Admitting: Internal Medicine

## 2020-08-04 NOTE — Assessment & Plan Note (Addendum)
Burned out but with significant residual scarring, and additional radiation fibrosis Bothersome chronic cough, not changed Plan- depo 80 at her request, CXR chest for fibrosis status, otc Delsym cough syrup

## 2020-08-07 DIAGNOSIS — L518 Other erythema multiforme: Secondary | ICD-10-CM | POA: Diagnosis not present

## 2020-08-13 ENCOUNTER — Telehealth: Payer: Self-pay | Admitting: Cardiovascular Disease

## 2020-08-13 NOTE — Telephone Encounter (Signed)
Received a call from patient stated she is presently having chest pain with pain radiating down both arms.Rates pain #5.Stated she wants to come to office to have a EKG.Advised we do not see patient's in office with active chest pain.Advised to go to [facility] ED.Trish notified.

## 2020-08-13 NOTE — Telephone Encounter (Signed)
Pt c/o of Chest Pain: STAT if CP now or developed within 24 hours  1. Are you having CP right now? yes  2. Are you experiencing any other symptoms (ex. SOB, nausea, vomiting, sweating)? Gets SOB, was not SOB on the phone  3. How long have you been experiencing CP? month  4. Is your CP continuous or coming and going? Comes and goes   5. Have you taken Nitroglycerin? No, does not have it  Patient states she has been having an aching pain in the center of her breast.     ?

## 2020-08-27 ENCOUNTER — Ambulatory Visit: Payer: Medicare Other | Admitting: Neurology

## 2020-09-02 ENCOUNTER — Ambulatory Visit: Payer: Medicare PPO | Admitting: Podiatry

## 2020-09-02 ENCOUNTER — Other Ambulatory Visit: Payer: Self-pay

## 2020-09-02 DIAGNOSIS — L84 Corns and callosities: Secondary | ICD-10-CM | POA: Diagnosis not present

## 2020-09-02 DIAGNOSIS — B351 Tinea unguium: Secondary | ICD-10-CM | POA: Diagnosis not present

## 2020-09-02 DIAGNOSIS — M79672 Pain in left foot: Secondary | ICD-10-CM

## 2020-09-02 DIAGNOSIS — M79676 Pain in unspecified toe(s): Secondary | ICD-10-CM

## 2020-09-02 DIAGNOSIS — M79671 Pain in right foot: Secondary | ICD-10-CM | POA: Diagnosis not present

## 2020-09-04 ENCOUNTER — Encounter: Payer: Self-pay | Admitting: Podiatry

## 2020-09-04 NOTE — Progress Notes (Signed)
Subjective:  Patient ID: Danielle Peters, female    DOB: 01-02-1941,  MRN: [id number]  79 y.o. female presents with painful thick toenails that are difficult to trim. Pain interferes with ambulation. Aggravating factors include wearing enclosed shoe gear. Pain is relieved with periodic professional debridement.   She voices no new pedal problems on today's visit.  Review of Systems: Negative except as noted in the HPI.  Past Medical History:  Diagnosis Date  . Anemia   . Anxiety   . Breast cancer ([place]) 2004   right  . Cancer ([place])    breast ca  . Chest pain    echo 01/08/10- nl lv; mild aortic sclerosis; myoview 01/08/10- no ischemia  . Dyslipidemia    myaligia with statins  . Esophageal reflux   . Hypertension   . Personal history of chemotherapy 2004  . Personal history of radiation therapy 2004  . Pulmonary sarcoidosis ([place])   . Shingles 03/2016  . Tremor    upper extremities  . Ventricular tachycardia (paroxysmal) ([facility])    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; AV node reentry tachycardia ablation    Past Surgical History:  Procedure Laterality Date  . Boil Right 03/2015   on buttock, excision  . BREAST LUMPECTOMY Right 2004  . left breast lumpectomy,chemo  2004   xrt  . SKIN BIOPSY    . UPPER GI ENDOSCOPY  02/2015   difficulty swallowing, Abd pain   Patient Active Problem List   Diagnosis Date Noted  . Seasonal allergic rhinitis 01/24/2020  . Bilateral hearing loss 09/21/2018  . Bilateral impacted cerumen 09/21/2018  . 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 Ulcer of left heel, limited to breakdown of skin ([place]) 11/04/2017  . Seasonal and perennial allergic rhinitis 12/31/2016  . Hypercholesterolemia 12/01/2016  . Tremor 10/07/2016  . Left lumbar radiculopathy 10/07/2016  . Chronic obstructive bronchitis without exacerbation ([place]) 04/22/2015  . LBBB (left bundle branch block) 06/09/2014  . Paresthesia 03/30/2014  . Chest pain, atypical 12/09/2013   . Itching 06/28/2013  . Right leg pain 03/02/2013  . Essential hypertension 03/02/2013  . GLAUCOMA 02/27/2010  . Essential tremor 08/30/2009  . Bronchitis, chronic obstructive, with exacerbation ([place]) 07/30/2008  . PULMONARY SARCOIDOSIS 11/04/2007  . Malignant neoplasm of upper outer quadrant of female breast ([place]) 11/04/2007  . ANEMIA 11/04/2007  . Anxiety state 11/04/2007  . Paroxysmal supraventricular tachycardia ([place]) 11/04/2007  . ESOPHAGEAL REFLUX 11/04/2007    Current Outpatient Medications:  .  acyclovir (ZOVIRAX) 400 MG tablet, Take 1 tablet twice a day, Disp: , Rfl:  .  ALPRAZolam (XANAX) 0.25 MG tablet, , Disp: , Rfl:  .  aspirin 81 MG tablet, Take 81 mg by mouth every other day. , Disp: , Rfl:  .  betamethasone dipropionate (DIPROLENE) 0.05 % cream, Apply 1 application topically 2 (two) times daily., Disp: , Rfl:  .  BREO ELLIPTA 100-25 MCG/INH AEPB, INHALE 1 PUFF BY MOUTH EVERY DAY, Disp: 60 each, Rfl: 0 .  diazepam (VALIUM) 2 MG tablet, Take 2 mg by mouth every 6 (six) hours as needed for anxiety. , Disp: , Rfl:  .  diclofenac sodium (VOLTAREN) 1 % GEL, Apply 2 g topically 4 (four) times daily. Rub into affected area of foot 2 to 4 times daily, Disp: 100 g, Rfl: 2 .  diclofenac Sodium (VOLTAREN) 1 % GEL, Apply 2 g topically 4 (four) times daily. Rub into affected area of foot 2 to 4 times daily, Disp: 100 g, Rfl: 2 .  econazole nitrate 1 % cream, SMARTSIG:Sparingly Topical Daily, Disp: , Rfl:  .  fluticasone (FLONASE) 50 MCG/ACT nasal spray, SPRAY 2 SPRAYS INTO EACH NOSTRIL EVERY DAY, Disp: , Rfl: 11 .  hydrocortisone 2.5 % lotion, APPLY TOPICALLY TO THE FACE DAILY AS NEEDED., Disp: , Rfl:  .  hydrocortisone valerate ointment (WEST-CORT) 0.2 %, Add'l Sig Add'l Sig topical Add'l Sig, Disp: , Rfl:  .  ketoconazole (NIZORAL) 2 % cream, , Disp: , Rfl:  .  loratadine (CLARITIN) 10 MG tablet, Take 10 mg by mouth daily as needed for allergies., Disp: , Rfl:  .  meclizine  (ANTIVERT) 12.5 MG tablet, Take 12.5 mg by mouth 3 (three) times daily as needed for dizziness., Disp: , Rfl:  .  omeprazole (PRILOSEC) 20 MG capsule, Take 20 mg by mouth 2 (two) times daily., Disp: , Rfl:  .  ondansetron (ZOFRAN-ODT) 4 MG disintegrating tablet, Take 4 mg by mouth every 8 (eight) hours as needed., Disp: , Rfl:  .  primidone (MYSOLINE) 50 MG tablet, Take 1 in the morning, take 1.5 tablets at night, Disp: 220 tablet, Rfl: 3 .  tiZANidine (ZANAFLEX) 2 MG tablet, Take by mouth every 6 (six) hours as needed for muscle spasms., Disp: , Rfl:  .  triamcinolone cream (KENALOG) 0.1 %, APPLY TO AFFECTED AREA AS NEEDED AS DIRECTED, Disp: , Rfl:  .  valACYclovir (VALTREX) 1000 MG tablet, , Disp: , Rfl:  .  valsartan-hydrochlorothiazide (DIOVAN-HCT) 80-12.5 MG per tablet, Take 1 tablet by mouth daily., Disp: , Rfl: 4 .  pravastatin (PRAVACHOL) 40 MG tablet, Take 1 tablet (40 mg total) by mouth every evening., Disp: 90 tablet, Rfl: 3 Allergies  Allergen Reactions  . Dextromethorphan Polistirex Er Other (See Comments)    SOB  . Primidone     Severe Headaches  . Albuterol     REACTION: tachycardia  . Amoxicillin Diarrhea and Nausea Only  . Asa [Aspirin]     Adult dose  . Levaquin [Levofloxacin] Other (See Comments)    Per patient, caused tendonitis.   . Loratadine   . Nortriptyline     Increase heart rate.  . Simvastatin     Myalgias    Social History   Occupational History  . 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 Smoking status: Never Smoker  . Smokeless tobacco: Never Used  Substance and Sexual Activity  . Alcohol use: No  . Drug use: No  . Sexual activity: Not on file    Objective:   Constitutional Pt is a pleasant 79 y.o. African American female WD, WN in NAD. AAO x 3.   Vascular Capillary fill time to digits <3 seconds b/l lower extremities. Palpable pedal pulses b/l LE. Pedal hair absent. Lower extremity skin temperature gradient within normal limits.  No pain with calf compression b/l. No edema noted b/l lower extremities. No cyanosis or clubbing noted.  Neurologic Normal speech. Oriented to person, place, and time. Protective sensation intact 5/5 intact bilaterally with 10g monofilament b/l. Clonus negative b/l.  Dermatologic Pedal skin with normal turgor, texture and tone bilaterally. No open wounds bilaterally. No interdigital macerations bilaterally. Toenails right hallux and 2-5 b/l elongated, discolored, dystrophic, thickened, crumbly with subungual debris and tenderness to dorsal palpation. Anonychia left hallux with evidence of permanent total nail avulsion. Nailbed(s) completely epithelialized and intact. Hyperkeratotic lesion(s) distal aspect b/l hallux lateral aspect of right foot and posteromedial aspect of left heel.  No erythema, no edema, no drainage, no flocculence.  Orthopedic:  Normal muscle strength 5/5 to all lower extremity muscle groups bilaterally. No pain crepitus or joint limitation noted with ROM b/l. No gross bony deformities bilaterally. Patient ambulates independent of any assistive aids.   Radiographs: None Assessment:   1. Pain due to onychomycosis of toenail   2. Callus   3. Pain in both feet    Plan:  Patient was evaluated and treated and all questions answered.  Onychomycosis with pain -Nails palliatively debridement as below. -Educated on self-care  Procedure: Nail Debridement Rationale: Pain Type of Debridement: manual, sharp debridement. Instrumentation: Nail nipper, rotary burr. Number of Nails: 9  -Examined patient. -No new findings. No new orders. -Toenails right hallux and 2-5 b/l were debrided in length and girth with sterile nail nippers and dremel without iatrogenic bleeding.  -Callus(es) distal tip b/l hallux, lateral aspect of right foot and posteromedial aspect of left hee pared utilizing sterile scalpel blade without complication or incident. Total number debrided =4. -Dispensed two  digital toe caps for daily use for b/l hallux. Apply every morning. Remove every evening. -Patient to report any pedal injuries to medical professional immediately. -Patient to continue soft, supportive shoe gear daily. -Patient/POA to call should there be question/concern in the interim.  Return in about 3 months (around 12/03/2020) for toenail debridement w/corn(s)/callus(es).  Marzetta Board, DPM

## 2020-09-10 ENCOUNTER — Ambulatory Visit: Payer: Medicare PPO | Admitting: Podiatry

## 2020-10-02 DIAGNOSIS — D869 Sarcoidosis, unspecified: Secondary | ICD-10-CM | POA: Diagnosis not present

## 2020-10-02 DIAGNOSIS — J069 Acute upper respiratory infection, unspecified: Secondary | ICD-10-CM | POA: Diagnosis not present

## 2020-10-02 DIAGNOSIS — J449 Chronic obstructive pulmonary disease, unspecified: Secondary | ICD-10-CM | POA: Diagnosis not present

## 2020-10-02 DIAGNOSIS — Z1152 Encounter for screening for COVID-19: Secondary | ICD-10-CM | POA: Diagnosis not present

## 2020-10-02 DIAGNOSIS — R059 Cough, unspecified: Secondary | ICD-10-CM | POA: Diagnosis not present

## 2020-10-02 DIAGNOSIS — I10 Essential (primary) hypertension: Secondary | ICD-10-CM | POA: Diagnosis not present

## 2020-10-08 ENCOUNTER — Ambulatory Visit: Payer: Medicare PPO | Admitting: Neurology

## 2020-10-20 ENCOUNTER — Other Ambulatory Visit: Payer: Self-pay | Admitting: Neurology

## 2020-11-12 DIAGNOSIS — H3552 Pigmentary retinal dystrophy: Secondary | ICD-10-CM | POA: Diagnosis not present

## 2020-11-12 DIAGNOSIS — H524 Presbyopia: Secondary | ICD-10-CM | POA: Diagnosis not present

## 2020-11-12 DIAGNOSIS — H401131 Primary open-angle glaucoma, bilateral, mild stage: Secondary | ICD-10-CM | POA: Diagnosis not present

## 2020-11-14 DIAGNOSIS — Z1152 Encounter for screening for COVID-19: Secondary | ICD-10-CM | POA: Diagnosis not present

## 2020-11-14 DIAGNOSIS — R519 Headache, unspecified: Secondary | ICD-10-CM | POA: Diagnosis not present

## 2020-11-14 DIAGNOSIS — R059 Cough, unspecified: Secondary | ICD-10-CM | POA: Diagnosis not present

## 2020-11-14 DIAGNOSIS — J019 Acute sinusitis, unspecified: Secondary | ICD-10-CM | POA: Diagnosis not present

## 2020-12-10 ENCOUNTER — Encounter: Payer: Self-pay | Admitting: Neurology

## 2020-12-10 ENCOUNTER — Ambulatory Visit: Payer: Medicare PPO | Admitting: Neurology

## 2020-12-10 VITALS — BP 124/80 | HR 98 | Ht 63.25 in | Wt 138.0 lb

## 2020-12-10 DIAGNOSIS — R269 Unspecified abnormalities of gait and mobility: Secondary | ICD-10-CM

## 2020-12-10 DIAGNOSIS — R251 Tremor, unspecified: Secondary | ICD-10-CM | POA: Diagnosis not present

## 2020-12-10 MED ORDER — PROPRANOLOL HCL 20 MG PO TABS: 20.0000 mg | ORAL_TABLET | Freq: Two times a day (BID) | ORAL | 11 refills | Status: DC

## 2020-12-10 NOTE — Patient Instructions (Signed)
Primidone 50mg  once a day for one week then stop

## 2020-12-10 NOTE — Progress Notes (Signed)
HISTORY OF PRESENT ILLNESS: Danielle Peters is a 80 year old right-handed American female, follow up for essential tremor   She has past medical history of hypertension, hyperlipidemia, anxiety, history of right breast cancer, status post lobectomy in 2004, followed by radiation and chemotherapy, she developed bilateral hands paresthesia following chemotherapy consistent with-induced peripheral neuropathy. She presenting with few years history of bilateral hands tremor, getting worse when she stretched out her hands, or using utensils, she has describe difficulty feeding herself, cause a lot of social embarrasement, she denies significant gait difficulty, she also has head titubation She denied bilateral lower exremity paresthesia weakness, but does complain fingertips numbness tingling. She denied loss of smell, REM sleep disorder, has anxiety symptoms, denied family history of tremor Over the past few years, we have tried primadone, while taking 50mg  1/2 once a day , there was no significant improvement at that time, she also complains of headaches, but also tried diazepam, Ativan, without significant improvement  UPDATE Oct 08 2014:  She is no longer taking primidone, she is taking clonazepam 0.5 mg half to one tablets every morning, which has helped her tremor, she complains of anxiety, but does not want to take more medications for it  UPDATE Oct 07 2016:  She took clonazepam 0.5 milligrams half to 1 tablet as needed for tremor for a while without significant improvement, she continue have worsening bilateral hands resting and posturing tremor, occasionally involving bilateral lower extremity, she denies significant gait abnormality, she exercise regularly, she complains of recent onset of left-sided low back pain, radiating pain to left lower extremity, left calf muscle. She is taking Lyrica 75 mg a day  She just healed from right arm shingles in June 2017, still has intermittent itching  in her right arm and hand,  UPDATE January 05 2017: She complains of constant bilateral hand resting and posturing tremor, previously tried primidone, clonazepam with limited help, currently she is taking Valium 2 mg as needed, which helps her some, she previously took metoprolol low-dose did not notice any help for her tremor,  This is most consistent with essential tremor, there was no family history of similar disease, no parkinsonian features.  Her low back pain has much improved, she has stopped taking nortriptyline  UPDATE May 31 2018: Her older sister has parkinson's disease.  She now noticed worsening bilateral hands tremor, previously could not tolerate clonazepam cause excessive drowsiness, beta-blocker, she did not notice any significant benefit, she is now taking primidone 50 mg twice a day, higher dose would cause significant drowsiness, she denied loss of sense of smell, she denies significant gait abnormality.  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  Difficulty sleeping ALLERGIES: Allergies  Allergen Reactions  . Dextromethorphan Polistirex Er Other (See Comments)    SOB  . Primidone     Severe Headaches  . Albuterol     REACTION: tachycardia  . Amoxicillin Diarrhea and Nausea Only  .  Asa [Aspirin]     Adult dose  . Levaquin [Levofloxacin] Other (See Comments)    Per patient, caused  tendonitis.   . Loratadine   . Nortriptyline     Increase heart rate.  . Simvastatin     Myalgias     HOME MEDICATIONS: Outpatient Medications Prior to Visit  Medication Sig Dispense Refill  . ALPRAZolam (XANAX) 0.25 MG tablet     . aspirin 81 MG tablet Take 81 mg by mouth every other day.    . betamethasone dipropionate (DIPROLENE) 0.05 % cream Apply 1 application topically 2 (two) times daily.    [name] BREO ELLIPTA 100-25 MCG/INH AEPB INHALE 1 PUFF BY MOUTH EVERY DAY 60 each 0  . diazepam (VALIUM) 2 MG tablet Take 2 mg by mouth every 6 (six) hours as needed for anxiety.     . fluticasone (FLONASE) 50 MCG/ACT nasal spray SPRAY 2 SPRAYS INTO EACH NOSTRIL EVERY DAY  11  . loratadine (CLARITIN) 10 MG tablet Take 10 mg by mouth daily as needed for allergies.    [name] meclizine (ANTIVERT) 12.5 MG tablet Take 12.5 mg by mouth 3 (three) times daily as needed for dizziness.    [name] omeprazole (PRILOSEC) 20 MG capsule Take 20 mg by mouth 2 (two) times daily.    . primidone (MYSOLINE) 50 MG tablet Take 1 in the morning, take 1.5 tablets at night 220 tablet 3  . valsartan-hydrochlorothiazide (DIOVAN-HCT) 80-12.5 MG per tablet Take 1 tablet by mouth daily.  4  . acyclovir (ZOVIRAX) 400 MG tablet Take 1 tablet twice a day    . diclofenac sodium (VOLTAREN) 1 % GEL Apply 2 g topically 4 (four) times daily. Rub into affected area of foot 2 to 4 times daily 100 g 2  . diclofenac Sodium (VOLTAREN) 1 % GEL Apply 2 g topically 4 (four) times daily. Rub into affected area of foot 2 to 4 times daily 100 g 2  . econazole nitrate 1 % cream SMARTSIG:Sparingly Topical Daily    . hydrocortisone 2.5 % lotion APPLY TOPICALLY TO THE FACE DAILY AS NEEDED.    . hydrocortisone valerate ointment (WEST-CORT) 0.2 % Add'l Sig Add'l Sig topical Add'l Sig    . ketoconazole (NIZORAL) 2 % cream     . ondansetron (ZOFRAN-ODT) 4 MG disintegrating tablet Take 4 mg by mouth every 8 (eight) hours as needed.    [name] tiZANidine (ZANAFLEX) 2 MG  tablet Take by mouth every 6 (six) hours as needed for muscle spasms.    [name] triamcinolone cream (KENALOG) 0.1 % APPLY TO AFFECTED AREA AS NEEDED AS DIRECTED    . valACYclovir (VALTREX) 1000 MG tablet     . pravastatin (PRAVACHOL) 40 MG tablet Take 1 tablet (40 mg total) by mouth every evening. 90 tablet 3   No facility-administered medications prior to visit.    PAST MEDICAL HISTORY: Past Medical History:  Diagnosis Date  . Anemia   . Anxiety   . Breast cancer ([place]) 2004   right  . Cancer ([place])    breast ca  . Chest pain    echo 01/08/10- nl lv; mild aortic sclerosis; myoview 01/08/10- no ischemia  . Dyslipidemia    myaligia with statins  . Esophageal reflux   . Hypertension   . Personal history of chemotherapy 2004  . Personal history of radiation therapy 2004  . Pulmonary sarcoidosis ([place])   . Shingles 03/2016  . Tremor    upper extremities  . 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 Boil Right 03/2015   on buttock, excision  . BREAST LUMPECTOMY Right 2004  . left breast lumpectomy,chemo  2004   xrt  . SKIN BIOPSY    . UPPER GI ENDOSCOPY  02/2015   difficulty swallowing, Abd pain    FAMILY HISTORY: Family History  Problem Relation Age of Onset  . Heart failure Father 17  . Cancer Mother   . Heart attack Sister        2 heart attacks  . Breast cancer Neg Hx     SOCIAL HISTORY: Social History   Socioeconomic History  . Marital status: Divorced    Spouse name: Not on file  . Number of children: 2  . Years of education: 63  . Highest education level: Not on file  Occupational History  . 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 Smoking status: Never Smoker  . Smokeless tobacco: Never Used  Substance and Sexual Activity  . Alcohol use: No  . Drug use: No  . Sexual activity: Not on file  Other  Topics Concern  . Not on file  Social History Narrative   Patient lives at home alone. Patient is retired.Pateint has high school education. And went to [employer].   Right handed.   Caffeine- one cup daily.   Social Determinants of Health   Financial Resource Strain: Not on file  Food Insecurity: Not on file  Transportation Needs: Not on file  Physical Activity: Not on file  Stress: Not on file  Social Connections: Not on file  Intimate Partner Violence: Not on file    PHYSICAL EXAM  Vitals:   12/10/20 0920  BP: 124/80  Pulse: 98  Weight: 138 lb (62.6 kg)  Height: 5' 3.25" (1.607 m)   Body mass index is 24.25 kg/m.   PHYSICAL EXAMNIATION:  Gen: NAD, conversant, well nourised, well groomed                     Cardiovascular: Regular rate rhythm, no peripheral edema, warm, nontender. Eyes: Conjunctivae clear without exudates or hemorrhage Neck: Supple, no carotid bruits. Pulmonary: Clear to auscultation bilaterally   NEUROLOGICAL EXAM:  MENTAL STATUS: Speech/Cognition: Awake, alert, normal speech, oriented to history taking and casual conversation.  CRANIAL NERVES: CN II: Visual fields are full to confrontation.  Pupils are round equal and briskly reactive to light. CN III, IV, VI: extraocular movement are normal. No ptosis. CN V: Facial sensation is intact to light touch. CN VII: Face is symmetric with normal eye closure and smile. CN VIII: Hearing is normal to casual conversation CN IX, X: Palate elevates symmetrically. Phonation is normal. CN XI: Head turning and shoulder shrug are intact   MOTOR: Muscle strength is normal, almost constant  action more than resting bilateral hands symmetric large amplitude tremor, no significant rigidity, bradykinesia, difficulty drawing spiral circle bilaterally, or sign her name  REFLEXES: Reflexes are 1 and symmetric at the biceps, triceps, knees and ankles. Plantar responses are flexor.  SENSORY: Intact to light  touch, pinprick, positional and vibratory sensation at fingers and toes.  COORDINATION: There is no trunk or limb ataxia.    GAIT/STANCE: She needs push-up to get up from seated position, wide-based, mildly waddling gait  DIAGNOSTIC DATA (LABS, IMAGING, TESTING) - I reviewed patient records, labs, notes, testing and imaging myself where  available.  Lab Results  Component Value Date   WBC 6.0 05/08/2020   HGB 13.5 05/08/2020   HCT 40.9 05/08/2020   MCV 84 05/08/2020   PLT 181 05/08/2020      Component Value Date/Time   NA 143 05/08/2020 1121   K 4.4 05/08/2020 1121   CL 100 05/08/2020 1121   CO2 26 05/08/2020 1121   GLUCOSE 97 05/08/2020 1121   GLUCOSE 102 (H) 11/16/2016 1001   BUN 12 05/08/2020 1121   CREATININE 0.85 05/08/2020 1121   CREATININE 0.93 11/16/2016 1001   CALCIUM 9.9 05/08/2020 1121   PROT 7.3 05/08/2020 1121   ALBUMIN 4.7 05/08/2020 1121   AST 65 (H) 05/08/2020 1121   ALT 46 (H) 05/08/2020 1121   ALKPHOS 162 (H) 05/08/2020 1121   BILITOT 0.3 05/08/2020 1121   GFRNONAA 65 05/08/2020 1121   GFRAA 75 05/08/2020 1121   No results found for: CHOL, HDL, LDLCALC, LDLDIRECT, TRIG, CHOLHDL No results found for: HGBA1C No results found for: VITAMINB12 Lab Results  Component Value Date   TSH 0.99 11/16/2016    ASSESSMENT AND PLAN 80 y.o. year old female   Tremor  Action more than resting component, there was no bradykinesia, rigidity,  Losing benefit to primidone 50 mg twice a day, we decided to taper off,  Previously she also tried low-dose beta-blocker, clonazepam, currently Xanax with limited help,  We have talked about other possibilities such as Sinemet, " if I do not have Parkinson's disease I do not want to try it", she also does not want to be seen by [facility] movement specialist  Will try low-dose Inderal 20 mg twice a day  Gait abnormality  She had the slow worsening gait abnormality, likely due to combination of aging, deconditioning, fixed  cervical anterocollis,  Will refer her to home physical therapy  MRI of the brain to rule out hydrocephalus  Marcial Pacas, M.D. Ph.D.  [facility] [place], [place] [address] Phone: [phone] Fax:      [phone]

## 2020-12-11 ENCOUNTER — Telehealth: Payer: Self-pay | Admitting: Neurology

## 2020-12-11 NOTE — Telephone Encounter (Signed)
Danielle Peters: [id number] (exp. 12/11/20 to 03/12/21) order sent to GI. They will reach out to the patient to schedule.

## 2020-12-16 ENCOUNTER — Other Ambulatory Visit: Payer: Self-pay

## 2020-12-16 ENCOUNTER — Ambulatory Visit: Payer: Medicare PPO | Admitting: Podiatry

## 2020-12-16 ENCOUNTER — Encounter: Payer: Self-pay | Admitting: Podiatry

## 2020-12-16 DIAGNOSIS — B351 Tinea unguium: Secondary | ICD-10-CM

## 2020-12-16 DIAGNOSIS — M79676 Pain in unspecified toe(s): Secondary | ICD-10-CM | POA: Diagnosis not present

## 2020-12-16 DIAGNOSIS — M79672 Pain in left foot: Secondary | ICD-10-CM | POA: Diagnosis not present

## 2020-12-16 DIAGNOSIS — L84 Corns and callosities: Secondary | ICD-10-CM

## 2020-12-16 DIAGNOSIS — M79671 Pain in right foot: Secondary | ICD-10-CM | POA: Diagnosis not present

## 2020-12-16 NOTE — Progress Notes (Signed)
  Subjective:  Patient ID: Danielle Peters, female    DOB: 06-Sep-1941,  MRN: [id number]  80 y.o. female presents with painful thick toenails that are difficult to trim. Pain interferes with ambulation. Aggravating factors include wearing enclosed shoe gear. Pain is relieved with periodic professional debridement.   She voices no new pedal problems on today's visit.  Review of Systems: Negative except as noted in the HPI.  Allergies  Allergen Reactions  . Dextromethorphan Polistirex Er Other (See Comments)    SOB  . Primidone     Severe Headaches  . Albuterol     REACTION: tachycardia  . Amoxicillin Diarrhea and Nausea Only  . Asa [Aspirin]     Adult dose  . Levaquin [Levofloxacin] Other (See Comments)    Per patient, caused tendonitis.   . Loratadine   . Nortriptyline     Increase heart rate.  . Simvastatin     Myalgias     Objective:   Constitutional Pt is a pleasant 80 y.o. African American female WD, WN in NAD. AAO x 3.   Vascular Capillary fill time to digits <3 seconds b/l lower extremities. Palpable pedal pulses b/l LE. Pedal hair absent. Lower extremity skin temperature gradient within normal limits. No pain with calf compression b/l. No edema noted b/l lower extremities. No cyanosis or clubbing noted.  Neurologic Normal speech. Oriented to person, place, and time. Protective sensation intact 5/5 intact bilaterally with 10g monofilament b/l. Clonus negative b/l.  Dermatologic Pedal skin with normal turgor, texture and tone bilaterally. No open wounds bilaterally. No interdigital macerations bilaterally. Toenails right hallux and 2-5 b/l elongated, discolored, dystrophic, thickened, crumbly with subungual debris and tenderness to dorsal palpation. Anonychia left hallux with evidence of permanent total nail avulsion. Nailbed(s) completely epithelialized and intact. Hyperkeratotic lesion(s) left 5th digit distal aspect b/l hallux lateral aspect of right foot and  posteromedial aspect of b/l heel.  No erythema, no edema, no drainage, no flocculence.  Orthopedic: Normal muscle strength 5/5 to all lower extremity muscle groups bilaterally. No pain crepitus or joint limitation noted with ROM b/l. No gross bony deformities bilaterally. Patient ambulates independent of any assistive aids.   Radiographs: None Assessment:   1. Pain due to onychomycosis of toenail   2. Corns and callosities   3. Pain in both feet    Plan:  Patient was evaluated and treated and all questions answered.  Onychomycosis with pain -Nails palliatively debridement as below. -Educated on self-care  Procedure: Nail Debridement Rationale: Pain Type of Debridement: manual, sharp debridement. Instrumentation: Nail nipper, rotary burr. Number of Nails: 9  -Examined patient. -No new findings. No new orders. -Toenails right hallux and 2-5 b/l were debrided in length and girth with sterile nail nippers and dremel without iatrogenic bleeding.  -Callus(es) left 5th digit, distal tip b/l hallux, lateral aspect of right foot and posteromedial aspect of left hee pared utilizing sterile scalpel blade without complication or incident. Total number debrided =5 .-Recommended shoes with stretchable uppers such as Oofos for painful corn left 5th toe. -Patient to report any pedal injuries to medical professional immediately. -Patient to continue soft, supportive shoe gear daily. -Patient/POA to call should there be question/concern in the interim.  Return in about 3 months (around 03/15/2021).  Marzetta Board, DPM

## 2020-12-16 NOTE — Patient Instructions (Signed)
Purchase Oofos OOmg eeZee Low Shoe from [email or web address]

## 2020-12-19 ENCOUNTER — Ambulatory Visit (HOSPITAL_COMMUNITY)
Admission: EM | Admit: 2020-12-19 | Discharge: 2020-12-19 | Disposition: A | Discharge status: Home / Self Care | Payer: Medicare PPO | Attending: Student | Admitting: Student

## 2020-12-19 ENCOUNTER — Encounter (HOSPITAL_COMMUNITY): Payer: Self-pay | Admitting: Emergency Medicine

## 2020-12-19 ENCOUNTER — Other Ambulatory Visit: Payer: Self-pay

## 2020-12-19 DIAGNOSIS — H65113 Acute and subacute allergic otitis media (mucoid) (sanguinous) (serous), bilateral: Secondary | ICD-10-CM | POA: Diagnosis not present

## 2020-12-19 DIAGNOSIS — H6123 Impacted cerumen, bilateral: Secondary | ICD-10-CM

## 2020-12-19 DIAGNOSIS — J3089 Other allergic rhinitis: Secondary | ICD-10-CM | POA: Diagnosis not present

## 2020-12-19 DIAGNOSIS — R251 Tremor, unspecified: Secondary | ICD-10-CM

## 2020-12-19 DIAGNOSIS — D860 Sarcoidosis of lung: Secondary | ICD-10-CM

## 2020-12-19 MED ORDER — AZITHROMYCIN 250 MG PO TABS: 250.0000 mg | ORAL_TABLET | Freq: Every day | ORAL | 0 refills | Status: DC

## 2020-12-19 MED ORDER — CETIRIZINE HCL 5 MG PO TABS: 5.0000 mg | ORAL_TABLET | Freq: Every day | ORAL | 2 refills | Status: DC

## 2020-12-19 NOTE — Discharge Instructions (Addendum)
-  For your ear infection, start the z-pack: Take two pills on day 1. Take one pill per day on days 2-5. -Start the zyrtec for your allergies- try this once a day for 1 week. If you like it you can continue this indefinitely. -Follow-up with your PCP if symptoms worsen or persist.

## 2020-12-19 NOTE — ED Triage Notes (Signed)
Pt presents today with c/o of bilateral ear pain, denies discharge or fever.

## 2020-12-19 NOTE — ED Provider Notes (Signed)
[place]    CSN: [id number] Arrival date & time: 12/19/20  1338      History   Chief Complaint Chief Complaint  Patient presents with  . Otalgia    bilateral    HPI Danielle Peters is a 80 y.o. female presenting today with otalgia L>R.  History anemia, anxiety, breast cancer, chest pain, hyperlipidemia, GERD, hypertension, chemotherapy, pulmonary sarcoidosis, shingles, tremor, V. tach, gait abnormality, allergic rhinitis.  She endorses congestion due to allergic rhinitis for which she is taking no medications for  at present.  Endorses bilateral ear pain, left worse than right, for 3 weeks with muffled hearing.  Denies dizziness, headaches. Denies fevers/chills, n/v/d, shortness of breath, chest pain, cough, facial pain, teeth pain, headaches, sore throat, loss of taste/smell, swollen lymph nodes.  Patient stating she wants her ears cleaned out.  Taking inhalers for pulmonary sarcoidosis as directed.  Tremor at baseline, unchanged per patient.   HPI  Past Medical History:  Diagnosis Date  . Anemia   . Anxiety   . Breast cancer ([place]) 2004   right  . Cancer ([place])    breast ca  . Chest pain    echo 01/08/10- nl lv; mild aortic sclerosis; myoview 01/08/10- no ischemia  . Dyslipidemia    myaligia with statins  . Esophageal reflux   . Hypertension   . Personal history of chemotherapy 2004  . Personal history of radiation therapy 2004  . Pulmonary sarcoidosis ([place])   . Shingles 03/2016  . Tremor    upper extremities  . Ventricular tachycardia (paroxysmal) (HCC)    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; AV node reentry tachycardia ablation     Patient Active Problem List   Diagnosis Date Noted  . Gait abnormality 12/10/2020  . Seasonal allergic rhinitis 01/24/2020  . Bilateral hearing loss 09/21/2018  . Bilateral impacted cerumen 09/21/2018  . 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 Ulcer of left heel, limited to breakdown of skin ([place])  11/04/2017  . Seasonal and perennial allergic rhinitis 12/31/2016  . Hypercholesterolemia 12/01/2016  . Tremor 10/07/2016  . Left lumbar radiculopathy 10/07/2016  . 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 LBBB (left bundle branch block) 06/09/2014  . Paresthesia 03/30/2014  . Chest pain, atypical 12/09/2013  . Itching 06/28/2013  . Right leg pain 03/02/2013  . Essential hypertension 03/02/2013  . GLAUCOMA 02/27/2010  . Essential tremor 08/30/2009  . Bronchitis, chronic obstructive, with exacerbation ([place]) 07/30/2008  . PULMONARY SARCOIDOSIS 11/04/2007  . Malignant neoplasm of upper outer quadrant of female breast ([place]) 11/04/2007  . ANEMIA 11/04/2007  . Anxiety state 11/04/2007  . Paroxysmal supraventricular tachycardia ([facility]) 11/04/2007  . ESOPHAGEAL REFLUX 11/04/2007    Past Surgical History:  Procedure Laterality Date  . Boil Right 03/2015   on buttock, excision  . BREAST LUMPECTOMY Right 2004  . left breast lumpectomy,chemo  2004   xrt  . SKIN BIOPSY    . UPPER GI ENDOSCOPY  02/2015   difficulty swallowing, Abd pain    OB History   No obstetric history on file.      Home Medications    Prior to Admission medications   Medication Sig Start Date End Date Taking? Authorizing Provider  ALPRAZolam Duanne Moron) 0.25 MG tablet  08/14/20  Yes [provider]  aspirin 81 MG tablet Take 81 mg by mouth every other day.   Yes [provider]  azithromycin (ZITHROMAX Z-PAK) 250 MG tablet Take 1 tablet (250 mg total)  by mouth daily. Take two pills on day 1. Take one pill per day on days 2-5. 12/19/20  Yes Phillip Heal, Sherlon Handing, PA-C  BREO ELLIPTA 100-25 MCG/INH AEPB INHALE 1 PUFF BY MOUTH EVERY DAY 02/07/20  Yes Young, Tarri Fuller D, MD  cetirizine (ZYRTEC) 5 MG tablet Take 1 tablet (5 mg total) by mouth daily. 12/19/20  Yes Hazel Sams, PA-C  propranolol (INDERAL) 20 MG tablet Take 1 tablet (20 mg total) by mouth 2 (two) times daily. 12/10/20   Yes Marcial Pacas, MD  betamethasone dipropionate (DIPROLENE) 0.05 % cream Apply 1 application topically 2 (two) times daily. 09/23/18   [provider]  diazepam (VALIUM) 2 MG tablet Take 2 mg by mouth every 6 (six) hours as needed for anxiety.     [provider]  fluticasone (FLONASE) 50 MCG/ACT nasal spray SPRAY 2 SPRAYS INTO EACH NOSTRIL EVERY DAY 08/09/17   [provider]  loratadine (CLARITIN) 10 MG tablet Take 10 mg by mouth daily as needed for allergies.    [provider]  meclizine (ANTIVERT) 12.5 MG tablet Take 12.5 mg by mouth 3 (three) times daily as needed for dizziness.    [provider]  omeprazole (PRILOSEC) 20 MG capsule Take 20 mg by mouth 2 (two) times daily. 09/25/19   [provider]  primidone (MYSOLINE) 50 MG tablet Take 1 in the morning, take 1.5 tablets at night 05/08/20   Suzzanne Cloud, NP  valsartan-hydrochlorothiazide (DIOVAN-HCT) 80-12.5 MG per tablet Take 1 tablet by mouth daily. 05/29/15   [provider]    Family History Family History  Problem Relation Age of Onset  . Heart failure Father 40  . Cancer Mother   . Heart attack Sister        2 heart attacks  . Breast cancer Neg Hx     Social History Social History   Tobacco Use  . Smoking status: Never Smoker  . Smokeless tobacco: Never Used  Substance Use Topics  . Alcohol use: No  . Drug use: No     Allergies   Dextromethorphan polistirex er, Primidone, Albuterol, Amoxicillin, Asa [aspirin], Levaquin [levofloxacin], Loratadine, Nortriptyline, and Simvastatin   Review of Systems Review of Systems  Constitutional: Negative for appetite change, chills and fever.  HENT: Positive for congestion and ear pain. Negative for rhinorrhea, sinus pressure, sinus pain and sore throat.   Eyes: Negative for redness and visual disturbance.  Respiratory: Negative for cough, chest tightness, shortness of breath and wheezing.   Cardiovascular:  Negative for chest pain and palpitations.  Gastrointestinal: Negative for abdominal pain, constipation, diarrhea, nausea and vomiting.  Genitourinary: Negative for dysuria, frequency and urgency.  Musculoskeletal: Negative for myalgias.  Neurological: Negative for dizziness, weakness and headaches.  Psychiatric/Behavioral: Negative for confusion.  All other systems reviewed and are negative.    Physical Exam Triage Vital Signs ED Triage Vitals  Enc Vitals Group     BP 12/19/20 1433 (!) 155/78     Pulse Rate 12/19/20 1433 93     Resp 12/19/20 1433 20     Temp 12/19/20 1433 98 F (36.7 C)     Temp Source 12/19/20 1433 Oral     SpO2 12/19/20 1433 100 %     Weight --      Height --      Head Circumference --      Peak Flow --      Pain Score 12/19/20 1429 0     Pain Loc --  Pain Edu? --      Excl. in [place]? --    No data found.  Updated Vital Signs BP (!) 155/78 (BP Location: Left Arm)   Pulse 93   Temp 98 F (36.7 C) (Oral)   Resp 20   SpO2 100%   Visual Acuity Right Eye Distance:   Left Eye Distance:   Bilateral Distance:    Right Eye Near:   Left Eye Near:    Bilateral Near:     Physical Exam Vitals reviewed.  Constitutional:      General: She is not in acute distress.    Appearance: Normal appearance. She is not ill-appearing.  HENT:     Head: Normocephalic and atraumatic.     Right Ear: Hearing, ear canal and external ear normal. No swelling or tenderness. There is impacted cerumen. No mastoid tenderness. Tympanic membrane is erythematous. Tympanic membrane is not perforated, retracted or bulging.     Left Ear: Hearing, ear canal and external ear normal. Tenderness present. No swelling. There is impacted cerumen. No mastoid tenderness. Tympanic membrane is erythematous. Tympanic membrane is not perforated, retracted or bulging.     Ears:     Comments: Patient initially with impacted cerumen bilaterally. Following disimpaction, erythematous TMs with L  tenderness.    Nose:     Right Sinus: No maxillary sinus tenderness or frontal sinus tenderness.     Left Sinus: No maxillary sinus tenderness or frontal sinus tenderness.     Mouth/Throat:     Mouth: Mucous membranes are moist.     Pharynx: Uvula midline. No oropharyngeal exudate or posterior oropharyngeal erythema.     Tonsils: No tonsillar exudate.  Cardiovascular:     Rate and Rhythm: Normal rate and regular rhythm.     Heart sounds: Normal heart sounds.  Pulmonary:     Breath sounds: Normal air entry. Rhonchi present. No wheezing or rales.     Comments: Few rhonchi Chest:     Chest wall: No tenderness.  Abdominal:     General: Abdomen is flat. Bowel sounds are normal.     Tenderness: There is no abdominal tenderness. There is no guarding or rebound.  Lymphadenopathy:     Cervical: No cervical adenopathy.  Neurological:     General: No focal deficit present.     Mental Status: She is alert and oriented to person, place, and time.     Motor: Tremor present.     Comments: Resting tremor of bilateral UEs.  Psychiatric:        Attention and Perception: Attention and perception normal.        Mood and Affect: Mood and affect normal.        Behavior: Behavior normal. Behavior is cooperative.        Thought Content: Thought content normal.        Judgment: Judgment normal.      UC Treatments / Results  Labs (all labs ordered are listed, but only abnormal results are displayed) Labs Reviewed - No data to display  EKG   Radiology No results found.  Procedures Procedures (including critical care time)  Medications Ordered in UC Medications - No data to display  Initial Impression / Assessment and Plan / UC Course  I have reviewed the triage vital signs and the nursing notes.  Pertinent labs & imaging results that were available during my care of the patient were reviewed by me and considered in my medical decision making (see chart for details).  This  patient is a 80 year old female presenting with bilateral otalgia for 3 weeks.  Cerumen was disimpacted today.  Patient tolerated this well.  For bilateral otitis media, azithromycin sent.  Patient is pen allergic and states she cannot tolerate doxycycline.  Also discussed option of prednisone, which she states she cannot tolerate due to feeling "jittery" on this.  Patient with history of pulmonary sarcoidosis; today she is afebrile, nontachypneic, nontachycardic, oxygenating well on room air with few rhonchi throughout.  Continue inhalers.  Tremor of upper extremities at baseline.  Return precautions discussed.  Rec follow-up with PCP for recheck in 1 to 2 weeks.  This chart was dictated using voice recognition software, Dragon. Despite the best efforts of this provider to proofread and correct errors, errors may still occur which can change documentation meaning.   Final Clinical Impressions(s) / UC Diagnoses   Final diagnoses:  Non-recurrent acute allergic otitis media of both ears  Seasonal allergic rhinitis due to other allergic trigger  Bilateral impacted cerumen  Pulmonary sarcoidosis (HCC)  Tremor     Discharge Instructions     -For your ear infection, start the z-pack: Take two pills on day 1. Take one pill per day on days 2-5. -Start the zyrtec for your allergies- try this once a day for 1 week. If you like it you can continue this indefinitely. -Follow-up with your PCP if symptoms worsen or persist.    ED Prescriptions    Medication Sig Dispense Auth. Provider   azithromycin (ZITHROMAX Z-PAK) 250 MG tablet Take 1 tablet (250 mg total) by mouth daily. Take two pills on day 1. Take one pill per day on days 2-5. 6 tablet Hazel Sams, PA-C   cetirizine (ZYRTEC) 5 MG tablet Take 1 tablet (5 mg total) by mouth daily. 30 tablet Hazel Sams, PA-C     PDMP not reviewed this encounter.   Hazel Sams, PA-C 12/19/20 1510

## 2020-12-24 ENCOUNTER — Telehealth: Payer: Self-pay | Admitting: Neurology

## 2020-12-24 NOTE — Telephone Encounter (Signed)
Called and spoke to patient and she wants to hold of Home PT for Now . Patient will  call me back when she is ready to schedule . I tried to get her to schedule .   Patient is still going to have her MRI . Patient states Inderal 20 mg twice a day  Is making her Dizzy    Sharyn Lull I relayed to patient it may be 12/25/2020 before you can call her back. Patient was fine with that .

## 2020-12-25 NOTE — Telephone Encounter (Signed)
I left a message requesting a return call.

## 2020-12-25 NOTE — Telephone Encounter (Signed)
Noted I have sent Message to [facility] . Patient is ready to schedule Home PT. Please call her to schedule . Thanks Hinton Dyer .

## 2020-12-25 NOTE — Telephone Encounter (Signed)
I spoke to the patient who reports dizziness and drowsiness after taking three doses of propranolol 20mg  (prescribed twice daily for tremors). She is unsure if it helped her tremors but would like to take something for them.   Instructed her to restart the medication back taking 0.5 tablet BID to see if the reported side effects can be eliminated. If she is doing well, then she can attempt to increase back up to 1 tablet BID after one week.   I ask her to call us back if the dizziness and drowsiness continue to persist.  She is now willing to move forward with home PT. We will help her get this coordinated.

## 2020-12-26 ENCOUNTER — Ambulatory Visit
Admission: RE | Admit: 2020-12-26 | Discharge: 2020-12-26 | Disposition: A | Discharge status: Home / Self Care | Payer: Medicare PPO | Source: Ambulatory Visit | Attending: Neurology | Admitting: Neurology

## 2020-12-26 ENCOUNTER — Other Ambulatory Visit: Payer: Self-pay

## 2020-12-26 ENCOUNTER — Telehealth: Payer: Self-pay | Admitting: Neurology

## 2020-12-26 DIAGNOSIS — R251 Tremor, unspecified: Secondary | ICD-10-CM

## 2020-12-26 DIAGNOSIS — R269 Unspecified abnormalities of gait and mobility: Secondary | ICD-10-CM

## 2020-12-26 NOTE — Telephone Encounter (Signed)
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  Incidental degenerative changes are noted in the upper cervical spine at C3-4.  Please call patient, MRI of the brain showed generalized atrophy, small vessel disease, no acute abnormalities

## 2020-12-26 NOTE — Telephone Encounter (Signed)
I was unable to reach the patient. I spoke to her daughter on [facility] [place]) and provided her with the results. She will relay the information to her mother. She can call back if she has any additional questions.

## 2021-01-07 DIAGNOSIS — H6123 Impacted cerumen, bilateral: Secondary | ICD-10-CM | POA: Diagnosis not present

## 2021-01-07 DIAGNOSIS — J302 Other seasonal allergic rhinitis: Secondary | ICD-10-CM | POA: Diagnosis not present

## 2021-01-07 DIAGNOSIS — H93293 Other abnormal auditory perceptions, bilateral: Secondary | ICD-10-CM | POA: Diagnosis not present

## 2021-01-10 ENCOUNTER — Telehealth: Payer: Self-pay | Admitting: Neurology

## 2021-01-10 NOTE — Addendum Note (Signed)
Addended by: Desmond Lope on: 01/10/2021 03:43 PM   Modules accepted: Orders

## 2021-01-10 NOTE — Telephone Encounter (Signed)
Pt has called to report the propranolol (INDERAL) 20 MG tablet is not sitting well with her and she would like a call to discuss trying another medication.

## 2021-01-10 NOTE — Telephone Encounter (Signed)
I spoke to the patient. She has already stopped the propranolol. Says the medication was not helpful. It also caused dizziness and drowsiness. At her last visit, Dr. Krista Blue spoke to her about Sinemet and a referral to a movement specialist at [facility]. She is still not interested in either of these options (scared to try Sinemet and does not have transportation to [place]). She picked up a refill and plans to restart Primidone at her previous dose to see if it will work (50mg  in am and 75mg  in pm). I reminded her that she had lost benefit with this medication in the past but she insisted on trying it again. I instructed her to slowly titrate up the medication to avoid adverse side effects. She will take 0.5 tab BID x 1 week, then 1 tab BID x 1 week, then 1 tab in AM & 1.5 tab in PM thereafter. She will call us back with any concerns.   She also needs our office to check on her home health referral. Says someone called her last week and she them she needed an alternate appt date. They never called her back to reschedule.

## 2021-01-14 NOTE — Telephone Encounter (Signed)
Called and left message for patient to see Home Health come .

## 2021-01-16 ENCOUNTER — Other Ambulatory Visit: Payer: Self-pay | Admitting: Physician Assistant

## 2021-01-16 DIAGNOSIS — E7800 Pure hypercholesterolemia, unspecified: Secondary | ICD-10-CM

## 2021-01-27 NOTE — Progress Notes (Signed)
HPI  Female never smoker followed for sarcoid, chronic bronchitis complicated by history of right breast cancer/ XRT, tachycardia, GERD, anxiety, glaucoma, tremor.[name]  PCP Dr Danielle Peters Med Cardiac Study 11/25/16- EF 77%, hyperdynamic, otw  WNL Office Spirometry 12/31/2016-mild restriction of exhaled volume. FVC 1.61/77%, FEV1 1.37/86%, ratio 0.85, FEF 25-75% 2.00/ 144% -----------------------------------------------------------------------------------------.   07/30/20- 80 year old female never smoker followed for Sarcoid, Chronic Bronchitis, accompanied by history right breast CA/XRT, Tachycardia, GERD, anxiety, Glaucoma, Tremor Breo 100, Spiriva Respimat, Flonase,  Covid vax- 2 Phizer Flu vax- wtw till after booster -----  1 yr f/u - c/o dry cough  worse in the afternoon for past 2 months - SOB about the same - Occas wheezing Recent phone call we offered her a nebulizer machine with Duoneb but she didn't feel she needed to have it.  Increased cough over past 2 months, dry or scant white. No fever. Tremor is worse over time and makes it hard to use metered inhalers. Benzonatate no help for cough. Known substantial scarring from sarcoid.  01/28/21- 80 year old female never smoker followed for Sarcoid, Chronic Bronchitis, accompanied by history right breast CA/XRT, Tachycardia, GERD, anxiety, Glaucoma, Tremor -Breo 100, Spiriva Respimat, Flonase, Zyrtec,  Covid vax-3 Phizer Flu vax-had -----Concerned about mouth breathing Nasal congestion over thee last 2 weeks with some R periorbital pressure, no discharge. No fever. Chronic cough - usually dry or scant white. Actively dislikes nasal sprays.  Describes rash starting as small points over various parts of body. Tends to extend as a patch, then resolve with some hyperpigmentation. Dermatologist biopsied skin of knee and she thinks he told her "herpes". She is not describing a chicken pos or zoster type rash. She was treated with acyclovir. CXR  07/31/20- IMPRESSION: No definite acute cardiopulmonary process. Probable chronic changes of sarcoid.  ROS- see HPI  + = positive Constitutional:   No-   weight loss, night sweats, fevers, chills,+ fatigue, lassitude. HEENT:   No-  headaches, difficulty swallowing, tooth/dental problems, sore throat,       No-  sneezing, itching, ear ache, nasal congestion, +post nasal drip,  CV:   chest pain, orthopnea, PND, swelling in lower extremities, anasarca, dizziness, palpitations Resp: No-   shortness of breath with exertion or at rest.              productive cough,  + non-productive cough,  No-  coughing up of blood.              No-   change in color of mucus.  No- wheezing.   Skin: +HPI GI:  No-   heartburn, indigestion, abdominal pain, nausea, vomiting,  GU:. MS:  No-   joint pain or swelling.  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 No depression or anxiety.  No memory loss.  Obj General- Alert, Oriented, Affect-appropriate, Distress- none acute,                     + Always slumped, almost kyphotic posture Skin- + hyperpigmentation  Several isolated spots Lymphadenopathy- none Head- atraumatic            Eyes- Gross vision intact, PERRLA, conjunctivae clear secretions            Ears- Hearing, canals-normal            Nose- Clear, no-Septal dev, mucus, polyps, erosion, perforation             Throat- Mallampati II ,  mucosa clear , drainage- none, tonsils- atrophic. + Dentures Neck- flexible , trachea midline, no stridor , thyroid nl, carotid no bruit Chest - symmetrical excursion , unlabored           Heart/CV- RRR with skipped beats , no murmur , no gallop  , no rub, nl s1 s2                - JVD- none , edema- none, stasis changes- none, varices- none           Lung- + inspiratory squeaks diffuse ,  Work of breathing is not increased.   cough- none , dullness-none, rub- none           Chest wall- +treatment Scar R breast, Abd- No  HSM Br/ Gen/ Rectal- Not done, not indicated Extrem- cyanosis- none, clubbing, none, atrophy- none, strength- nl Neuro- + significant tremor hands

## 2021-01-28 ENCOUNTER — Encounter: Payer: Self-pay | Admitting: Internal Medicine

## 2021-01-28 ENCOUNTER — Other Ambulatory Visit: Payer: Self-pay

## 2021-01-28 ENCOUNTER — Ambulatory Visit (INDEPENDENT_AMBULATORY_CARE_PROVIDER_SITE_OTHER): Payer: Medicare PPO

## 2021-01-28 ENCOUNTER — Ambulatory Visit: Payer: Medicare PPO | Admitting: Internal Medicine

## 2021-01-28 VITALS — BP 114/64 | HR 94 | Temp 97.7°F | Ht 63.75 in | Wt 136.6 lb

## 2021-01-28 DIAGNOSIS — D869 Sarcoidosis, unspecified: Secondary | ICD-10-CM

## 2021-01-28 DIAGNOSIS — J301 Allergic rhinitis due to pollen: Secondary | ICD-10-CM | POA: Diagnosis not present

## 2021-01-28 DIAGNOSIS — Z853 Personal history of malignant neoplasm of breast: Secondary | ICD-10-CM

## 2021-01-28 MED ORDER — PSEUDOEPHEDRINE HCL 30 MG PO TABS: ORAL_TABLET | ORAL | 1 refills | Status: DC

## 2021-01-28 MED ORDER — ALBUTEROL SULFATE HFA 108 (90 BASE) MCG/ACT IN AERS: 2.0000 | INHALATION_SPRAY | Freq: Four times a day (QID) | RESPIRATORY_TRACT | 12 refills | Status: DC | PRN

## 2021-01-28 NOTE — Progress Notes (Signed)
Spoke with pt and notified of results per Dr. Young Pt verbalized understanding and denied any questions. 

## 2021-01-28 NOTE — Assessment & Plan Note (Signed)
Likely nasal congestion now from active pollen season. Plan- she doesn't like nasal sprays, so will try cautious use of Sudafed as discussed.

## 2021-01-28 NOTE — Assessment & Plan Note (Signed)
Chronic scarring and resultant chronic bronchitis. Doubt active, but will update CXR and ACE.

## 2021-01-28 NOTE — Patient Instructions (Signed)
Order- lab- Angiotensin Converting Enzyme level   Dx sarcoid  Order- CXR   Dx Hx sarcoid, hx XRT for breast cancer  Script sent for albuterol rescue inhaler to carry with you, and for Sudafed decongestant for your nose

## 2021-01-29 LAB — ANGIOTENSIN CONVERTING ENZYME: Angiotensin-Converting Enzyme: 61 U/L (ref 9–67)

## 2021-02-01 ENCOUNTER — Other Ambulatory Visit: Payer: Self-pay | Admitting: Physician Assistant

## 2021-02-01 DIAGNOSIS — E7800 Pure hypercholesterolemia, unspecified: Secondary | ICD-10-CM

## 2021-02-04 DIAGNOSIS — H903 Sensorineural hearing loss, bilateral: Secondary | ICD-10-CM | POA: Diagnosis not present

## 2021-02-05 ENCOUNTER — Encounter: Payer: Self-pay | Admitting: Cardiovascular Disease

## 2021-02-05 ENCOUNTER — Ambulatory Visit: Payer: Medicare PPO | Admitting: Cardiovascular Disease

## 2021-02-05 ENCOUNTER — Other Ambulatory Visit: Payer: Self-pay

## 2021-02-05 VITALS — BP 130/62 | HR 98 | Ht 63.0 in | Wt 137.8 lb

## 2021-02-05 DIAGNOSIS — I10 Essential (primary) hypertension: Secondary | ICD-10-CM

## 2021-02-05 DIAGNOSIS — I471 Supraventricular tachycardia: Secondary | ICD-10-CM | POA: Diagnosis not present

## 2021-02-05 DIAGNOSIS — G250 Essential tremor: Secondary | ICD-10-CM | POA: Diagnosis not present

## 2021-02-05 DIAGNOSIS — D860 Sarcoidosis of lung: Secondary | ICD-10-CM | POA: Diagnosis not present

## 2021-02-05 DIAGNOSIS — I447 Left bundle-branch block, unspecified: Secondary | ICD-10-CM | POA: Diagnosis not present

## 2021-02-05 MED ORDER — PRAVASTATIN SODIUM 80 MG PO TABS: 80.0000 mg | ORAL_TABLET | Freq: Every evening | ORAL | 3 refills | Status: DC

## 2021-02-05 NOTE — Patient Instructions (Signed)
Medication Instructions:  START Pravastatin 80 mg once daily at bedtime  *If you need a refill on your cardiac medications before your next appointment, please call your pharmacy*   Lab Work: Labs to be done at PCP  If you have labs (blood work) drawn today and your tests are completely normal, you will receive your results only by: [name] MyChart Message (if you have MyChart) OR . A paper copy in the mail If you have any lab test that is abnormal or we need to change your treatment, we will call you to review the results.   Testing/Procedures: None ordered   Follow-Up: At [facility], you and your health needs are our priority.  As part of our continuing mission to provide you with exceptional heart care, we have created designated Provider Care Teams.  These Care Teams include your primary Cardiologist (physician) and Advanced Practice Providers (APPs -  Physician Assistants and Nurse Practitioners) who all work together to provide you with the care you need, when you need it.  We recommend signing up for the patient portal called "MyChart".  Sign up information is provided on this After Visit Summary.  MyChart is used to connect with patients for Virtual Visits (Telemedicine).  Patients are able to view lab/test results, encounter notes, upcoming appointments, etc.  Non-urgent messages can be sent to your provider as well.   To learn more about what you can do with MyChart, go to [email or web address].    Your next appointment:   12 month(s)  The format for your next appointment:   In Person  Provider:   You may see Sanda Klein, MD or one of the following Advanced Practice Providers on your designated Care Team:    Almyra Deforest, PA-C  Fabian Sharp, PA-C or   Roby Lofts, [place]

## 2021-02-05 NOTE — Progress Notes (Signed)
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  The patient specifically denies any chest pain at rest or with exertion, dyspnea at rest or with exertion, orthopnea, paroxysmal nocturnal dyspnea, syncope, palpitations, focal neurological deficits, intermittent claudication, lower extremity edema, unexplained weight gain, cough, hemoptysis or wheezing.  She has severe hypercholesterolemia but was intolerant of rosuvastatin and simvastatin in the past, due to myalgia.  She does not have known vascular or coronary disease.  2020 nuclear study showed normal perfusion pattern and normal left ventricular ejection fraction.  She has fairly severe essential tremor, beta blocker intolerant due to fatigue, on primidone.   Past Medical History:  Diagnosis Date  . Anemia   . Anxiety   . Breast cancer ([place]) 2004   right  . Cancer ([place])    breast ca  . Chest pain    echo 01/08/10- nl lv; mild aortic sclerosis; myoview 01/08/10- no ischemia  . Dyslipidemia    myaligia with statins  . Esophageal reflux   . Hypertension   . Personal history of chemotherapy 2004  . Personal history of radiation therapy 2004  . Pulmonary sarcoidosis ([place])   . Shingles 03/2016  . Tremor    upper extremities  . Ventricular tachycardia (paroxysmal) ([facility])    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; AV node reentry tachycardia ablation    Past Surgical History:  Procedure Laterality Date  . Boil Right 03/2015   on buttock,  excision  . BREAST LUMPECTOMY Right 2004  . left breast lumpectomy,chemo  2004   xrt  . SKIN BIOPSY    . UPPER GI ENDOSCOPY  02/2015   difficulty swallowing, Abd pain     Current Meds  Medication Sig  . albuterol (VENTOLIN HFA) 108 (90 Base) MCG/ACT inhaler Inhale 2 puffs into the lungs every 6 (six) hours as needed for wheezing or shortness of breath.  . ALPRAZolam (XANAX) 0.25 MG tablet   . aspirin 81 MG tablet Take 81 mg by mouth every other day.  . betamethasone dipropionate (DIPROLENE) 0.05 % cream Apply 1 application topically 2 (two) times daily.  [name] BREO ELLIPTA 100-25 MCG/INH AEPB INHALE 1 PUFF BY MOUTH EVERY DAY  . cetirizine (ZYRTEC) 5 MG tablet Take 1 tablet (5 mg total) by mouth daily.  . diazepam (VALIUM) 2 MG tablet Take 2 mg by mouth every 6 (six) hours as needed for anxiety.   . fluticasone (FLONASE) 50 MCG/ACT nasal spray SPRAY 2 SPRAYS INTO EACH NOSTRIL EVERY DAY  . loratadine (CLARITIN) 10 MG tablet Take 10 mg by mouth daily as needed for allergies.  [name] meclizine (ANTIVERT) 12.5 MG tablet Take 12.5 mg by mouth 3 (three) times daily as needed for dizziness.  [name] omeprazole (PRILOSEC) 20 MG capsule Take 20 mg by mouth 2 (two) times daily.  . primidone (MYSOLINE) 50 MG tablet Take 1 in the morning, take 1.5 tablets at night  . pseudoephedrine (SUDAFED) 30 MG tablet 1 every 8 hours if needed for head congestion  . valsartan-hydrochlorothiazide (DIOVAN-HCT) 80-12.5 MG per tablet Take 1  tablet by mouth daily.     Allergies:   Dextromethorphan polistirex er, Primidone, Albuterol, Amoxicillin, Asa [aspirin], Levaquin [levofloxacin], Loratadine, Nortriptyline, Propranolol hcl, and Simvastatin   Social History   Tobacco Use  . Smoking status: Never Smoker  . Smokeless tobacco: Never Used  Substance Use Topics  . Alcohol use: No  . Drug use: No     Family Hx: The patient's family history includes Cancer in her mother; Heart attack in her sister; Heart failure (age of  onset: 25) in her father. There is no history of Breast cancer.  ROS:   Please see the history of present illness.   All other systems are reviewed and are negative.   Prior CV studies:   The following studies were reviewed today: Pharmacological stress test November 28, 2018  Labs/Other Tests and Data Reviewed:    EKG:  Ordered today shows NSR, biatrial enlargement, LBBB (QRS 122 ms), QTc 472 ms.  Recent Labs: 05/08/2020: ALT 46; BUN 12; Creatinine, Ser 0.85; Hemoglobin 13.5; Platelets 181; Potassium 4.4; Sodium 143   Recent Lipid Panel No results found for: CHOL, TRIG, HDL, CHOLHDL, LDLCALC, LDLDIRECT  05/17/2020 Chol 277, HDL 88, LDL170, TG 94  Wt Readings from Last 3 Encounters:  02/05/21 137 lb 12.8 oz (62.5 kg)  01/28/21 136 lb 9.6 oz (62 kg)  12/10/20 138 lb (62.6 kg)     Objective:    Vital Signs:  BP 130/62   Pulse 98   Ht 5\' 3"  (1.6 m)   Wt 137 lb 12.8 oz (62.5 kg)   SpO2 97%   BMI 24.41 kg/m     General: Alert, oriented x3, no distress, Head: no evidence of trauma, PERRL, EOMI, no exophtalmos or lid lag, no myxedema, no xanthelasma; normal ears, nose and oropharynx Neck: normal jugular venous pulsations and no hepatojugular reflux; brisk carotid pulses without delay and no carotid bruits Chest: clear to auscultation, no signs of consolidation by percussion or palpation, normal fremitus, symmetrical and full respiratory excursions Cardiovascular: normal position and quality of the apical impulse, regular rhythm, normal first and second heart sounds, no murmurs, rubs or gallops Abdomen: no tenderness or distention, no masses by palpation, no abnormal pulsatility or arterial bruits, normal bowel sounds, no hepatosplenomegaly Extremities: no clubbing, cyanosis or edema; 2+ radial, ulnar and brachial pulses bilaterally; 2+ right femoral, posterior tibial and dorsalis pedis pulses; 2+ left femoral, posterior tibial and dorsalis pedis pulses; no subclavian or femoral  bruits Neurological: grossly nonfocal except prominent essential tremor Psych: Normal mood and affect   ASSESSMENT & PLAN:    1. SVT (supraventricular tachycardia) ([place])   2. Essential hypertension   3. LBBB (left bundle branch block)   4. Pulmonary sarcoidosis ([place])   5. Essential tremor     1. Palpitations: asymptomatic. Beta blocker stopped without recurrence. 2. HTN: controlled. 3. HLP: intolerant of potent statins.  resume pravastatin 40 mg at bedtime daily.  She will have repeat lipid profile at her yearly physical with Dr. Elsworth Soho.  4. LBBB: previously intermittent, now permanent. 5. Pulmonary sarcoidosis: Appears to be quiescent.  Followed by Dr. Annamaria Boots. She is worried about taking albuterol since this caused anxiety. 6. Essential tremor: On primidone, sees Dr. Krista Blue.  The beta-blocker never really helped with this. Patient Instructions  Medication Instructions:  START Pravastatin 80 mg once daily at bedtime  *If you need a refill on your cardiac medications before your next appointment, please call your pharmacy*   Lab Work: Labs to be done  at PCP  If you have labs (blood work) drawn today and your tests are completely normal, you will receive your results only by: [name] MyChart Message (if you have MyChart) OR . A paper copy in the mail If you have any lab test that is abnormal or we need to change your treatment, we will call you to review the results.   Testing/Procedures: None ordered   Follow-Up: At [facility], you and your health needs are our priority.  As part of our continuing mission to provide you with exceptional heart care, we have created designated Provider Care Teams.  These Care Teams include your primary Cardiologist (physician) and Advanced Practice Providers (APPs -  Physician Assistants and Nurse Practitioners) who all work together to provide you with the care you need, when you need it.  We recommend signing up for the patient portal called  "MyChart".  Sign up information is provided on this After Visit Summary.  MyChart is used to connect with patients for Virtual Visits (Telemedicine).  Patients are able to view lab/test results, encounter notes, upcoming appointments, etc.  Non-urgent messages can be sent to your provider as well.   To learn more about what you can do with MyChart, go to [email or web address].    Your next appointment:   12 month(s)  The format for your next appointment:   In Person  Provider:   You may see Sanda Klein, MD or one of the following Advanced Practice Providers on your designated Care Team:    Almyra Deforest, PA-C  Fabian Sharp, [place] or   Roby Lofts, PA-C     Signed, Sanda Klein, MD  02/05/2021 9:56 AM    [place]

## 2021-02-18 DIAGNOSIS — H3552 Pigmentary retinal dystrophy: Secondary | ICD-10-CM | POA: Diagnosis not present

## 2021-02-18 DIAGNOSIS — H1820 Unspecified corneal edema: Secondary | ICD-10-CM | POA: Diagnosis not present

## 2021-03-04 DIAGNOSIS — H1820 Unspecified corneal edema: Secondary | ICD-10-CM | POA: Diagnosis not present

## 2021-03-13 DIAGNOSIS — M791 Myalgia, unspecified site: Secondary | ICD-10-CM | POA: Diagnosis not present

## 2021-03-13 DIAGNOSIS — D869 Sarcoidosis, unspecified: Secondary | ICD-10-CM | POA: Diagnosis not present

## 2021-03-13 DIAGNOSIS — R059 Cough, unspecified: Secondary | ICD-10-CM | POA: Diagnosis not present

## 2021-03-13 DIAGNOSIS — L309 Dermatitis, unspecified: Secondary | ICD-10-CM | POA: Diagnosis not present

## 2021-03-13 DIAGNOSIS — J449 Chronic obstructive pulmonary disease, unspecified: Secondary | ICD-10-CM | POA: Diagnosis not present

## 2021-03-13 DIAGNOSIS — R110 Nausea: Secondary | ICD-10-CM | POA: Diagnosis not present

## 2021-03-13 DIAGNOSIS — E785 Hyperlipidemia, unspecified: Secondary | ICD-10-CM | POA: Diagnosis not present

## 2021-03-28 ENCOUNTER — Other Ambulatory Visit: Payer: Self-pay

## 2021-03-28 ENCOUNTER — Ambulatory Visit (INDEPENDENT_AMBULATORY_CARE_PROVIDER_SITE_OTHER): Payer: Medicare PPO | Admitting: Podiatry

## 2021-03-28 ENCOUNTER — Encounter: Payer: Self-pay | Admitting: Podiatry

## 2021-03-28 DIAGNOSIS — M79672 Pain in left foot: Secondary | ICD-10-CM | POA: Diagnosis not present

## 2021-03-28 DIAGNOSIS — M79676 Pain in unspecified toe(s): Secondary | ICD-10-CM | POA: Diagnosis not present

## 2021-03-28 DIAGNOSIS — Q828 Other specified congenital malformations of skin: Secondary | ICD-10-CM

## 2021-03-28 DIAGNOSIS — L84 Corns and callosities: Secondary | ICD-10-CM | POA: Diagnosis not present

## 2021-03-28 DIAGNOSIS — B351 Tinea unguium: Secondary | ICD-10-CM | POA: Diagnosis not present

## 2021-03-28 DIAGNOSIS — M79671 Pain in right foot: Secondary | ICD-10-CM

## 2021-04-02 NOTE — Progress Notes (Signed)
  Subjective:  Patient ID: Danielle Peters, female    DOB: 1941/05/07,  MRN: [id number]  Short presents to clinic today for callus(es) b/l feet and painful thick toenails that are difficult to trim. Painful toenails interfere with ambulation. Aggravating factors include wearing enclosed shoe gear. Pain is relieved with periodic professional debridement. Painful calluses are aggravated when weightbearing with and without shoegear. Pain is relieved with periodic professional debridement.  Allergies  Allergen Reactions   Dextromethorphan Polistirex Er Other (See Comments)    SOB   Primidone     Severe Headaches   Albuterol     REACTION: tachycardia   Amoxicillin Diarrhea and Nausea Only   Asa [Aspirin]     Adult dose   Levaquin [Levofloxacin] Other (See Comments)    Per patient, caused tendonitis.    Loratadine    Nortriptyline     Increase heart rate.   Propranolol Hcl Other (See Comments)    Dizziness, Fatgue   Simvastatin     Myalgias     Review of Systems: Negative except as noted in the HPI. Objective:   Constitutional Danielle Peters is a pleasant 80 y.o. African American female, frail, in NAD. AAO x 3.   Vascular Capillary fill time to digits <3 seconds b/l lower extremities. Palpable pedal pulses b/l LE. Pedal hair absent. Lower extremity skin temperature gradient within normal limits. No pain with calf compression b/l. No edema noted b/l lower extremities. No cyanosis or clubbing noted.  Neurologic Normal speech. Oriented to person, place, and time. Protective sensation intact 5/5 intact bilaterally with 10g monofilament b/l.  Dermatologic Pedal skin with normal turgor, texture and tone bilaterally. No open wounds bilaterally. No interdigital macerations bilaterally. Toenails 2-5 bilaterally and R hallux well maintained with adequate length. No erythema, no edema, no drainage, no fluctuance. Anonychia noted L hallux. Nailbed(s) epithelialized.   Hyperkeratotic lesion(s) posteromedial heel b/l, L hallux, and R hallux.  No erythema, no edema, no drainage, no fluctuance. Porokeratotic lesion(s) L 5th toe. No erythema, no edema, no drainage, no fluctuance.  Orthopedic: Normal muscle strength 5/5 to all lower extremity muscle groups bilaterally. No pain crepitus or joint limitation noted with ROM b/l. No gross bony deformities bilaterally.   Radiographs: None Assessment:   1. Pain due to onychomycosis of toenail   2. Callus   3. Porokeratosis   4. Pain in both feet    Plan:  Patient was evaluated and treated and all questions answered.  Onychomycosis with pain -Nails palliatively debridement as below -Educated on self-care  Procedure: Nail Debridement Rationale: Pain Type of Debridement: manual, sharp debridement. Instrumentation: Nail nipper, rotary burr. Number of Nails: 10 -Examined patient. -Patient to continue soft, supportive shoe gear daily. -Toenails 2-5 bilaterally and R hallux debrided in length and girth without iatrogenic bleeding with sterile nail nipper and dremel.  -Callus(es) b/l heels, L hallux, and R hallux pared utilizing sterile scalpel blade without complication or incident. Total number debrided =4. -Painful porokeratotic lesion(s) L 5th toe pared and enucleated with sterile scalpel blade without incident. Total number of lesions debrided=1. -Patient to report any pedal injuries to medical professional immediately. -Dispensed digital toe cap for left 5th toe. Apply every morning. Remove every evening. -Patient/POA to call should there be question/concern in the interim.  Return in about 3 months (around 06/28/2021).  Marzetta Board, DPM

## 2021-04-15 DIAGNOSIS — H1820 Unspecified corneal edema: Secondary | ICD-10-CM | POA: Diagnosis not present

## 2021-04-15 DIAGNOSIS — H401131 Primary open-angle glaucoma, bilateral, mild stage: Secondary | ICD-10-CM | POA: Diagnosis not present

## 2021-04-30 DIAGNOSIS — R0981 Nasal congestion: Secondary | ICD-10-CM | POA: Diagnosis not present

## 2021-04-30 DIAGNOSIS — F419 Anxiety disorder, unspecified: Secondary | ICD-10-CM | POA: Diagnosis not present

## 2021-04-30 DIAGNOSIS — K5909 Other constipation: Secondary | ICD-10-CM | POA: Diagnosis not present

## 2021-04-30 DIAGNOSIS — D869 Sarcoidosis, unspecified: Secondary | ICD-10-CM | POA: Diagnosis not present

## 2021-04-30 DIAGNOSIS — Z1152 Encounter for screening for COVID-19: Secondary | ICD-10-CM | POA: Diagnosis not present

## 2021-04-30 DIAGNOSIS — R5383 Other fatigue: Secondary | ICD-10-CM | POA: Diagnosis not present

## 2021-04-30 DIAGNOSIS — R059 Cough, unspecified: Secondary | ICD-10-CM | POA: Diagnosis not present

## 2021-06-10 ENCOUNTER — Encounter: Payer: Self-pay | Admitting: Neurology

## 2021-06-10 ENCOUNTER — Ambulatory Visit: Payer: Medicare PPO | Admitting: Neurology

## 2021-06-10 VITALS — BP 151/71 | HR 92 | Ht 63.25 in | Wt 139.5 lb

## 2021-06-10 DIAGNOSIS — G250 Essential tremor: Secondary | ICD-10-CM

## 2021-06-10 DIAGNOSIS — R269 Unspecified abnormalities of gait and mobility: Secondary | ICD-10-CM | POA: Diagnosis not present

## 2021-06-10 MED ORDER — PRIMIDONE 50 MG PO TABS: 100.0000 mg | ORAL_TABLET | Freq: Two times a day (BID) | ORAL | 11 refills | Status: DC

## 2021-06-10 MED ORDER — PROPRANOLOL HCL 40 MG PO TABS: 40.0000 mg | ORAL_TABLET | Freq: Two times a day (BID) | ORAL | 11 refills | Status: DC

## 2021-06-10 NOTE — Progress Notes (Addendum)
ASSESSMENT AND PLAN 80 y.o. year old female   Tremor  Action more than resting component, there was no bradykinesia, rigidity,  Over the years tried low-dose beta-blocker, clonazepam, Xanax with limited help,  We have talked about other possibilities such as Sinemet, " if I do not have Parkinson's disease I do not want to try it", she also does not want to be seen by [facility] movement specialist  Now is back on primidone 50 mg 1/1.5 tab, seems to help some, will increase to 2/2, and add on inderal '40mg'$  bid.  Gait abnormality  She had the slow worsening gait abnormality, likely due to combination of aging, deconditioning, fixed cervical anterocollis,  MRI of the brain in March 2022 showed mild small vessel disease generalized atrophy   HISTORY OF PRESENT ILLNESS: Mrs. Scale is a 80 year old right-handed American female, follow up for essential tremor     She has past medical history of hypertension, hyperlipidemia, anxiety, history of right breast cancer, status post lobectomy in 2004, followed by radiation and chemotherapy, she developed bilateral hands paresthesia following chemotherapy consistent with-induced peripheral neuropathy. She presenting with few years history of bilateral hands tremor, getting worse when she stretched out her hands, or using utensils, she has describe difficulty feeding herself, cause a lot of social embarrasement, she denies significant gait difficulty, she also has head titubation She denied bilateral lower exremity paresthesia weakness, but does complain fingertips numbness tingling. She denied loss of smell, REM sleep disorder, has anxiety symptoms, denied family history of tremor Over the past few years, we have tried primadone, while taking '50mg'$  1/2 once a day , there was no significant improvement at that time, she also complains of headaches, but also tried diazepam, Ativan, without significant improvement   UPDATE Oct 08 2014:  She is no longer  taking primidone, she is taking clonazepam 0.5 mg half to one tablets every morning, which has helped her tremor, she complains of anxiety, but does not want to take more medications for it   UPDATE Oct 07 2016:  She took clonazepam 0.5 milligrams half to 1 tablet as needed for tremor for a while without significant improvement, she continue have worsening bilateral hands resting and posturing tremor, occasionally involving bilateral lower extremity, she denies significant gait abnormality, she exercise regularly, she complains of recent onset of left-sided low back pain, radiating pain to left lower extremity, left calf muscle. She is taking Lyrica 75 mg a day   She just healed from right arm shingles in June 2017, still has intermittent itching in her right arm and hand,   UPDATE January 05 2017: She complains of constant bilateral hand resting and posturing tremor, previously tried primidone, clonazepam with limited help, currently she is taking Valium 2 mg as needed, which helps her some, she previously took metoprolol low-dose did not notice any help for her tremor,   This is most consistent with essential tremor, there was no family history of similar disease, no parkinsonian features.   Her low back pain has much improved, she has stopped taking nortriptyline   UPDATE May 31 2018: Her older sister has parkinson's disease.  She now noticed worsening bilateral hands tremor, previously could not tolerate clonazepam cause excessive drowsiness, beta-blocker, she did not notice any significant benefit, she is now taking primidone 50 mg twice a day, higher dose would cause significant drowsiness, she denied loss of sense of smell, she denies significant gait abnormality.  UPDATE Dec 10 2020: She has stayed on  primidone 50 mg twice a day over the past few years, reported worsening bilateral hands tremor, affecting daily activity, also reported slow worsening gait abnormality, she cannot pass her  driver license test has quit driving since [id number], her brother check on her recently, she also complains of anxiety, grieving, one of her brother died last week, she has good appetite, difficulty sleeping, living apartment by herself,  UPDATE June 10 2021: She complains of bilateral feet paresthesia, was seen by podiatrist, has calluses on her feet, this also increase her mildly unsteady gait  She continue bothered by significant bilateral hand posturing tremor, difficulty with daily activity, such as holding utensils, currently on primidone 50 mg 1/1.5 tab a day, will add on beta-blocker  REVIEW OF SYSTEMS: Out of a complete 14 system review of symptoms, the patient complains only of the following symptoms, and all other reviewed systems are negative.  Difficulty sleeping ALLERGIES: Allergies  Allergen Reactions   Dextromethorphan Polistirex Er Other (See Comments)    SOB   Primidone     Severe Headaches   Albuterol     REACTION: tachycardia   Amoxicillin Diarrhea and Nausea Only   Asa [Aspirin]     Adult dose   Levaquin [Levofloxacin] Other (See Comments)    Per patient, caused tendonitis.    Loratadine    Nortriptyline     Increase heart rate.   Propranolol Hcl Other (See Comments)    Dizziness, Fatgue   Simvastatin     Myalgias     HOME MEDICATIONS: Outpatient Medications Prior to Visit  Medication Sig Dispense Refill   ALPRAZolam (XANAX) 0.25 MG tablet      aspirin 81 MG tablet Take 81 mg by mouth every other day.     betamethasone dipropionate (DIPROLENE) 0.05 % cream Apply 1 application topically 2 (two) times daily.     BREO ELLIPTA 100-25 MCG/INH AEPB INHALE 1 PUFF BY MOUTH EVERY DAY 60 each 0   fexofenadine (ALLEGRA) 60 MG tablet Take 60 mg by mouth 2 (two) times daily.     fluticasone (FLONASE) 50 MCG/ACT nasal spray SPRAY 2 SPRAYS INTO EACH NOSTRIL EVERY DAY  11   omeprazole (PRILOSEC) 20 MG capsule Take 20 mg by mouth 2 (two) times daily.     ondansetron  (ZOFRAN) 4 MG tablet Take 4 mg by mouth every 8 (eight) hours as needed for nausea or vomiting.     primidone (MYSOLINE) 50 MG tablet Take 1 in the morning, take 1.5 tablets at night 220 tablet 3   valsartan-hydrochlorothiazide (DIOVAN-HCT) 80-12.5 MG per tablet Take 1 tablet by mouth daily.  4   albuterol (VENTOLIN HFA) 108 (90 Base) MCG/ACT inhaler Inhale 2 puffs into the lungs every 6 (six) hours as needed for wheezing or shortness of breath. 18 g 12   cetirizine (ZYRTEC) 5 MG tablet Take 1 tablet (5 mg total) by mouth daily. 30 tablet 2   diazepam (VALIUM) 2 MG tablet Take 2 mg by mouth every 6 (six) hours as needed for anxiety.      loratadine (CLARITIN) 10 MG tablet Take 10 mg by mouth daily as needed for allergies.     meclizine (ANTIVERT) 12.5 MG tablet Take 12.5 mg by mouth 3 (three) times daily as needed for dizziness.     pravastatin (PRAVACHOL) 80 MG tablet Take 1 tablet (80 mg total) by mouth every evening. 90 tablet 3   pseudoephedrine (SUDAFED) 30 MG tablet 1 every 8 hours if needed for head congestion 30  tablet 1   No facility-administered medications prior to visit.    PAST MEDICAL HISTORY: Past Medical History:  Diagnosis Date   Anemia    Anxiety    Breast cancer ([place]) 2004   right   Cancer [facility])    breast ca   Chest pain    echo 01/08/10- nl lv; mild aortic sclerosis; myoview 01/08/10- no ischemia   Dyslipidemia    myaligia with statins   Esophageal reflux    Hypertension    Neuropathy    Personal history of chemotherapy 2004   Personal history of radiation therapy 2004   Pulmonary sarcoidosis ([place])    Shingles 03/2016   Tremor    upper extremities   Ventricular tachycardia (paroxysmal) ([facility])    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; AV node reentry tachycardia ablation     PAST SURGICAL HISTORY: Past Surgical History:  Procedure Laterality Date   Boil Right 03/2015   on buttock, excision   BREAST LUMPECTOMY Right 2004   left breast lumpectomy,chemo  2004   xrt    SKIN BIOPSY     UPPER GI ENDOSCOPY  02/2015   difficulty swallowing, Abd pain    FAMILY HISTORY: Family History  Problem Relation Age of Onset   Heart failure Father 45   Cancer Mother    Heart attack Sister        2 heart attacks   Breast cancer Neg Hx     SOCIAL HISTORY: Social History   Socioeconomic History   Marital status: Divorced    Spouse name: Not on file   Number of children: 2   Years of education: 12   Highest education level: Not on file  Occupational History   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   Right Handed   1 cup caffeine daily   Social Determinants of Health   Financial Resource Strain: Not on file  Food Insecurity: Not on file  Transportation Needs: Not on file  Physical Activity: Not on file  Stress: Not on file  Social Connections: Not on file  Intimate Partner Violence: Not on file    PHYSICAL EXAM  Vitals:   06/10/21 0957  BP: (!) 151/71  Pulse: 92  Weight: 139 lb 8 oz (63.3 kg)  Height: 5' 3.25" (1.607 m)   Body mass index is 24.52 kg/m.   PHYSICAL EXAMNIATION:  Gen: NAD, conversant, well nourised, well groomed      NEUROLOGICAL EXAM:  MENTAL STATUS: Speech/Cognition: Awake, alert, normal speech, oriented to history taking and casual conversation.  CRANIAL NERVES: CN II: Visual fields are full to confrontation.  Pupils are round equal and briskly reactive to light. CN III, IV, VI: extraocular movement are normal. No ptosis. CN V: Facial sensation is intact to light touch. CN VII: Face is symmetric with normal eye closure and smile. CN VIII: Hearing is normal to casual conversation CN IX, X: Palate elevates symmetrically. Phonation is normal. CN XI: Head turning  and shoulder shrug are intact   MOTOR: Muscle strength is normal, almost constant  action more than resting bilateral hands symmetric large amplitude tremor, no significant rigidity, bradykinesia, difficulty drawing spiral circle bilaterally, or sign her name  REFLEXES: Reflexes are 1 and symmetric at the biceps, triceps, knees and ankles. Plantar responses are flexor.  SENSORY: Intact to light touch, pinprick, positional and vibratory sensation at fingers and toes.  COORDINATION: There is no trunk or limb ataxia.    GAIT/STANCE: She needs push-up to get up from seated position, wide-based, mildly waddling gait  DIAGNOSTIC DATA (LABS, IMAGING, TESTING) - I reviewed patient records, labs, notes, testing and imaging myself where available.  Addendum: Laboratory evaluation from primary care physician Dr. Prince Solian, MD September 2022, glucose 92, GFR 68.8 creatinine 0.8, normal CMP, CBC hemoglobin of 12.8, LDL 175, cholesterol 271, A1c 5.8, positive COVID-19 July 04, 2021 rapid antigen test   Marcial Pacas, M.D. Ph.D.  [facility] [place],  [address] Phone: [phone] Fax:      [phone]

## 2021-07-04 DIAGNOSIS — R051 Acute cough: Secondary | ICD-10-CM | POA: Diagnosis not present

## 2021-07-04 DIAGNOSIS — D869 Sarcoidosis, unspecified: Secondary | ICD-10-CM | POA: Diagnosis not present

## 2021-07-04 DIAGNOSIS — R0602 Shortness of breath: Secondary | ICD-10-CM | POA: Diagnosis not present

## 2021-07-04 DIAGNOSIS — Z1152 Encounter for screening for COVID-19: Secondary | ICD-10-CM | POA: Diagnosis not present

## 2021-07-04 DIAGNOSIS — R5383 Other fatigue: Secondary | ICD-10-CM | POA: Diagnosis not present

## 2021-07-04 DIAGNOSIS — J449 Chronic obstructive pulmonary disease, unspecified: Secondary | ICD-10-CM | POA: Diagnosis not present

## 2021-07-04 DIAGNOSIS — U071 COVID-19: Secondary | ICD-10-CM | POA: Diagnosis not present

## 2021-07-07 ENCOUNTER — Ambulatory Visit: Payer: Medicare PPO | Admitting: Podiatry

## 2021-07-18 DIAGNOSIS — E785 Hyperlipidemia, unspecified: Secondary | ICD-10-CM | POA: Diagnosis not present

## 2021-07-18 DIAGNOSIS — I10 Essential (primary) hypertension: Secondary | ICD-10-CM | POA: Diagnosis not present

## 2021-07-23 DIAGNOSIS — I10 Essential (primary) hypertension: Secondary | ICD-10-CM | POA: Diagnosis not present

## 2021-07-23 DIAGNOSIS — D869 Sarcoidosis, unspecified: Secondary | ICD-10-CM | POA: Diagnosis not present

## 2021-07-23 DIAGNOSIS — I447 Left bundle-branch block, unspecified: Secondary | ICD-10-CM | POA: Diagnosis not present

## 2021-07-23 DIAGNOSIS — Z23 Encounter for immunization: Secondary | ICD-10-CM | POA: Diagnosis not present

## 2021-07-23 DIAGNOSIS — K219 Gastro-esophageal reflux disease without esophagitis: Secondary | ICD-10-CM | POA: Diagnosis not present

## 2021-07-23 DIAGNOSIS — Z0000 Encounter for general adult medical examination without abnormal findings: Secondary | ICD-10-CM | POA: Diagnosis not present

## 2021-07-23 DIAGNOSIS — E785 Hyperlipidemia, unspecified: Secondary | ICD-10-CM | POA: Diagnosis not present

## 2021-07-23 DIAGNOSIS — R82998 Other abnormal findings in urine: Secondary | ICD-10-CM | POA: Diagnosis not present

## 2021-07-23 DIAGNOSIS — J449 Chronic obstructive pulmonary disease, unspecified: Secondary | ICD-10-CM | POA: Diagnosis not present

## 2021-07-23 DIAGNOSIS — F39 Unspecified mood [affective] disorder: Secondary | ICD-10-CM | POA: Diagnosis not present

## 2021-07-23 DIAGNOSIS — I471 Supraventricular tachycardia: Secondary | ICD-10-CM | POA: Diagnosis not present

## 2021-08-04 ENCOUNTER — Other Ambulatory Visit: Payer: Self-pay

## 2021-08-04 ENCOUNTER — Ambulatory Visit: Payer: Medicare PPO | Admitting: Podiatry

## 2021-08-04 ENCOUNTER — Encounter: Payer: Self-pay | Admitting: Podiatry

## 2021-08-04 DIAGNOSIS — Q828 Other specified congenital malformations of skin: Secondary | ICD-10-CM

## 2021-08-04 DIAGNOSIS — B351 Tinea unguium: Secondary | ICD-10-CM | POA: Diagnosis not present

## 2021-08-04 DIAGNOSIS — M79676 Pain in unspecified toe(s): Secondary | ICD-10-CM | POA: Diagnosis not present

## 2021-08-04 DIAGNOSIS — M79671 Pain in right foot: Secondary | ICD-10-CM

## 2021-08-04 DIAGNOSIS — M79672 Pain in left foot: Secondary | ICD-10-CM

## 2021-08-04 DIAGNOSIS — L84 Corns and callosities: Secondary | ICD-10-CM

## 2021-08-09 NOTE — Progress Notes (Signed)
Subjective:  Patient ID: Danielle Peters, female    DOB: 12/03/1940,  MRN: [id number]  80 y.o. female presents callus(es) of both feet and painful thick toenails that are difficult to trim. Painful toenails interfere with ambulation. Aggravating factors include wearing enclosed shoe gear. Pain is relieved with periodic professional debridement. Painful calluses are aggravated when weightbearing with and without shoegear. Pain is relieved with periodic professional debridement. and painful porokeratotic lesions of the 5th digit.  Pain prevent comfortable ambulation. Aggravating factor is weightbearing with or without shoegear.  She is accompanied by her caregiver on today's visit.   Danielle Peters states the corn on her left 5th toe is painful.   She states she has pain in both ankles after being on her feet all day. She denies any episodes of trauma.   PCP is Avva, Ravisankar, MD , and last visit was. 1-2 weeks ago.  Allergies  Allergen Reactions   Dextromethorphan Polistirex Er Other (See Comments)    SOB   Primidone     Severe Headaches   Albuterol     REACTION: tachycardia   Amoxicillin Diarrhea and Nausea Only   Asa [Aspirin]     Adult dose   Levaquin [Levofloxacin] Other (See Comments)    Per patient, caused tendonitis.    Loratadine    Nortriptyline     Increase heart rate.   Propranolol Hcl Other (See Comments)    Dizziness, Fatgue   Simvastatin     Myalgias     Review of Systems: Negative except as noted in the HPI.   Objective:  Vascular Examination: Capillary fill time to digits <3 seconds b/l lower extremities. Palpable DP pulse(s) b/l lower extremities Palpable PT pulse(s) b/l lower extremities Pedal hair absent. Lower extremity skin temperature gradient within normal limits. No pain with calf compression b/l. No edema noted b/l lower extremities.  Neurological Examination: Sensation grossly intact b/l with 10 gram monofilament. Vibratory sensation intact  b/l.   Dermatological Examination: Hyperkeratotic lesion(s) L hallux, R hallux, and posteromedial aspect of heel b/l feet.  No erythema, no edema, no drainage, no fluctuance. Porokeratotic lesion(s) L 5th toe. No erythema, no edema, no drainage, no fluctuance. Pedal skin with normal turgor, texture and tone b/l. Toenails right hallux and 2-5 b/l thick, discolored, elongated with subungual debris and pain on dorsal palpation.   Musculoskeletal Examination: Hammertoe(s) noted to the L 5th toe and R 5th toe. Limited joint ROM to the bilateral ankles.  No effusions, no warmth noted. Muscle strength 5/5 to b/l LE. Anonychia left hallux. Nailbed intact.   Radiographs: None Assessment:   1. Pain due to onychomycosis of toenail   2. Callus   3. Porokeratosis   4. Pain in both feet    Plan:  -Medicare ABN signed for this year. Patient consents for services of calluses and porokeratosis  today. Copy has been placed in patient's chart. -We discussed arthritis of ankles and offered her an appointment to see one of our doctors for xrays and further workup. She declines on today and will call if symptoms continue.. -Patient to continue soft, supportive shoe gear daily. -Toenails 2-5 bilaterally and R hallux debrided in length and girth without iatrogenic bleeding with sterile nail nipper and dremel.  -Callus(es) posteromedial heel b/l, L hallux, and R hallux pared utilizing sterile scalpel blade without complication or incident. Total number debrided =4. -Painful porokeratotic lesion(s) L 5th toe pared and enucleated with sterile scalpel blade without incident. Total number of lesions debrided=1. -Patient to  report any pedal injuries to medical professional immediately. -Patient/POA to call should there be question/concern in the interim.  Return in about 3 months (around 11/04/2021).  Marzetta Board, DPM

## 2021-08-12 DIAGNOSIS — H1820 Unspecified corneal edema: Secondary | ICD-10-CM | POA: Diagnosis not present

## 2021-09-02 DIAGNOSIS — D869 Sarcoidosis, unspecified: Secondary | ICD-10-CM | POA: Diagnosis not present

## 2021-09-02 DIAGNOSIS — R0981 Nasal congestion: Secondary | ICD-10-CM | POA: Diagnosis not present

## 2021-09-02 DIAGNOSIS — Z1152 Encounter for screening for COVID-19: Secondary | ICD-10-CM | POA: Diagnosis not present

## 2021-09-02 DIAGNOSIS — J309 Allergic rhinitis, unspecified: Secondary | ICD-10-CM | POA: Diagnosis not present

## 2021-09-02 DIAGNOSIS — R051 Acute cough: Secondary | ICD-10-CM | POA: Diagnosis not present

## 2021-09-02 DIAGNOSIS — R5383 Other fatigue: Secondary | ICD-10-CM | POA: Diagnosis not present

## 2021-09-20 NOTE — Progress Notes (Signed)
Chart reviewed, agree above plan ?

## 2021-10-29 ENCOUNTER — Other Ambulatory Visit: Payer: Self-pay

## 2021-10-29 ENCOUNTER — Ambulatory Visit (INDEPENDENT_AMBULATORY_CARE_PROVIDER_SITE_OTHER): Payer: Medicare PPO | Admitting: Family

## 2021-10-29 ENCOUNTER — Encounter: Payer: Self-pay | Admitting: Family

## 2021-10-29 ENCOUNTER — Ambulatory Visit: Payer: Self-pay

## 2021-10-29 DIAGNOSIS — M79672 Pain in left foot: Secondary | ICD-10-CM

## 2021-10-29 DIAGNOSIS — M79671 Pain in right foot: Secondary | ICD-10-CM | POA: Diagnosis not present

## 2021-10-29 NOTE — Progress Notes (Signed)
Office Visit Note   Patient: Danielle Peters           Date of Birth: February 08, 1941           MRN: [id number] Visit Date: 10/29/2021              Requested by: Prince Solian, MD [place],  [place] [id number] PCP: Prince Solian, MD  No chief complaint on file.     HPI:  The patient is an 81 year old woman who presents today for bilateral foot evaluation with complaint of medial foot and ankle pain.  This has had a gradual onset has been ongoing for months to years.  She states she has been seeing several different doctors including podiatry for the same without any improvement.  She has tried changing her shoewear.  She states she does have some pain at rest this is made worse by standing and ambulating she is pointing to the medial ankle.  Assessment & Plan: Visit Diagnoses:  1. Foot pain, bilateral     Plan: Placed her in PTT braces bilaterally.  She states that she feels relief just and donning these braces she is pleased with them.  She will follow-up in the office in 4 to 6 weeks  Follow-Up Instructions: No follow-ups on file.   Ortho Exam  Patient is alert, oriented, no adenopathy, well-dressed, normal affect, normal respiratory effort. Examination patient has a good dorsalis pedis pulse bilaterally.  Tender along the course of the posterior tibial tendon bilaterally.  There is no edema no erythema no palpable Achilles defect no Achilles nodules.  Able to do single limb heel raise bilaterally without difficulty.  Imaging: No results found. No images are attached to the encounter.  Labs: Lab Results  Component Value Date   REPTSTATUS 08/04/2010 FINAL 08/02/2010   CULT  08/02/2010    Multiple bacterial morphotypes present, none predominant. Suggest appropriate recollection if clinically indicated.     Lab Results  Component Value Date   ALBUMIN 4.7 05/08/2020   ALBUMIN 3.9 05/05/2010   ALBUMIN 4.2 01/24/2009    Lab Results  Component  Value Date   MG 2.2 11/16/2016   MG 2.1 01/17/2008   No results found for: VD25OH  No results found for: PREALBUMIN CBC EXTENDED Latest Ref Rng & Units 05/08/2020 01/02/2016 01/27/2011  WBC 3.4 - 10.8 x10E3/uL 6.0 7.5 -  RBC 3.77 - 5.28 x10E6/uL 4.88 4.90 -  HGB 11.1 - 15.9 g/dL 13.5 13.6 14.3  HCT 34.0 - 46.6 % 40.9 42.1 42.0  PLT 150 - 450 x10E3/uL 181 162 -  NEUTROABS 1.4 - 7.0 x10E3/uL 3.6 - -  LYMPHSABS 0.7 - 3.1 x10E3/uL 1.7 - -     There is no height or weight on file to calculate BMI.  Orders:  Orders Placed This Encounter  Procedures   XR Foot 2 Views Right   XR Foot 2 Views Left    No orders of the defined types were placed in this encounter.    Procedures: No procedures performed  Clinical Data: No additional findings.  ROS:  All other systems negative, except as noted in the HPI. Review of Systems  Objective: Vital Signs: There were no vitals taken for this visit.  Specialty Comments:  No specialty comments available.  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[place]) 04/22/2015   LBBB (left bundle branch block) 06/09/2014   Paresthesia 03/30/2014   Chest pain, atypical 12/09/2013   Itching 06/28/2013   Right leg pain 03/02/2013   Essential hypertension 03/02/2013   GLAUCOMA 02/27/2010   Essential tremor 08/30/2009   Bronchitis, chronic obstructive, with exacerbation ([place]) 07/30/2008   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   Malignant neoplasm of upper outer  quadrant of female breast ([place]) 11/04/2007   ANEMIA 11/04/2007   Anxiety state 11/04/2007   Paroxysmal supraventricular tachycardia ([place]) 11/04/2007   ESOPHAGEAL REFLUX 11/04/2007   Past Medical History:  Diagnosis Date   Anemia    Anxiety    Breast cancer ([place]) 2004   right   Cancer ([place])    breast ca   Chest pain    echo 01/08/10- nl lv; mild aortic sclerosis; myoview 01/08/10- no ischemia   Dyslipidemia    myaligia with statins   Esophageal reflux    Hypertension    Neuropathy    Personal history of chemotherapy 2004   Personal history of radiation therapy 2004   Pulmonary sarcoidosis ([place])    Shingles 03/2016   Tremor    upper extremities   Ventricular tachycardia (paroxysmal)    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; AV node reentry tachycardia ablation     Family History  Problem Relation Age of Onset   Heart failure Father 73   Cancer Mother    Heart attack Sister        2 heart attacks   Breast cancer Neg Hx     Past Surgical History:  Procedure Laterality Date   Boil Right 03/2015   on buttock, excision   BREAST LUMPECTOMY Right 2004   left breast lumpectomy,chemo  2004   xrt   SKIN BIOPSY     UPPER GI ENDOSCOPY  02/2015   difficulty swallowing, Abd pain   Social History   Occupational History   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

## 2021-10-31 DIAGNOSIS — M79671 Pain in right foot: Secondary | ICD-10-CM | POA: Diagnosis not present

## 2021-11-11 ENCOUNTER — Ambulatory Visit: Payer: Medicare PPO | Admitting: Obstetrics and Gynecology

## 2021-11-12 ENCOUNTER — Encounter: Payer: Self-pay | Admitting: Obstetrics and Gynecology

## 2021-11-12 ENCOUNTER — Ambulatory Visit: Payer: Medicare PPO | Admitting: Obstetrics and Gynecology

## 2021-11-12 ENCOUNTER — Other Ambulatory Visit: Payer: Self-pay

## 2021-11-12 ENCOUNTER — Other Ambulatory Visit (HOSPITAL_COMMUNITY)
Admission: RE | Admit: 2021-11-12 | Discharge: 2021-11-12 | Disposition: A | Discharge status: Home / Self Care | Payer: Medicare PPO | Source: Ambulatory Visit | Attending: Obstetrics and Gynecology | Admitting: Obstetrics and Gynecology

## 2021-11-12 VITALS — BP 122/70 | HR 90 | Resp 16 | Ht 63.5 in | Wt 130.0 lb

## 2021-11-12 DIAGNOSIS — Z124 Encounter for screening for malignant neoplasm of cervix: Secondary | ICD-10-CM | POA: Diagnosis not present

## 2021-11-12 DIAGNOSIS — R102 Pelvic and perineal pain: Secondary | ICD-10-CM

## 2021-11-12 LAB — WET PREP FOR TRICH, YEAST, CLUE

## 2021-11-12 NOTE — Progress Notes (Signed)
GYNECOLOGY  VISIT   HPI: 81 y.o.  [phone] Divorced  African American  female   No obstetric history on file. with No LMP recorded. Patient is postmenopausal.   here for vaginal discomfort and lower pelvic pressure. Patient states since referral to our office her vaginal discomfort is some better.   Pain started suddenly one week ago.  She was straining at the time to a BM.  When she completed the BM, she felt better.   Has a BM once to two times a day.   No blood in the stool.   No blood in the urine.  Sometimes she has dysuria.  She notices pressure with voiding today.  No hx of UTIs.   No trauma.   No vaginal bleeding.  No vaginal discharge.   Not sexually active for years.   Has a caregiver due to tremors.   GYNECOLOGIC HISTORY: No LMP recorded. Patient is postmenopausal. Contraception:  PMP Menopausal hormone therapy:  none Last mammogram:  07-23-20 Neg/[facility]--knows to schedule Last pap smear:   Years ago--normal per patient        OB History   No obstetric history on file.        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[place]) 04/22/2015   LBBB (left bundle branch block) 06/09/2014   Paresthesia 03/30/2014   Chest pain, atypical 12/09/2013   Itching 06/28/2013   Right leg pain 03/02/2013   Essential hypertension 03/02/2013   GLAUCOMA 02/27/2010   Essential tremor 08/30/2009   Bronchitis, chronic obstructive, with exacerbation ([place]) 07/30/2008   Pulmonary sarcoidosis ([place])  11/04/2007   Malignant neoplasm of upper outer quadrant of female breast ([place]) 11/04/2007   ANEMIA 11/04/2007   Anxiety state 11/04/2007   Paroxysmal supraventricular tachycardia ([place]) 11/04/2007   ESOPHAGEAL REFLUX 11/04/2007    Past Medical History:  Diagnosis Date   Anemia    Anxiety    Breast cancer ([place]) 2004   right   Cancer ([place])    breast ca   Chest pain    echo 01/08/10- nl lv; mild aortic sclerosis; myoview 01/08/10- no ischemia   Dyslipidemia    myaligia with statins   Esophageal reflux    Hypertension    Neuropathy    Personal history of chemotherapy 2004   Personal history of radiation therapy 2004   Pulmonary sarcoidosis ([place])    Shingles 03/2016   Tremor    upper extremities   Ventricular tachycardia (paroxysmal)    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; AV node reentry tachycardia ablation     Past Surgical History:  Procedure Laterality Date   Boil Right 03/2015   on buttock, excision   BREAST LUMPECTOMY Right 2004   CHOLECYSTECTOMY     left breast lumpectomy,chemo  2004   xrt   SKIN BIOPSY     TUBAL LIGATION     UPPER GI ENDOSCOPY  02/2015   difficulty swallowing, Abd pain    Current Outpatient Medications  Medication Sig Dispense Refill   ALPRAZolam (XANAX) 0.25 MG tablet      aspirin 81  MG tablet Take 81 mg by mouth every other day.     azithromycin (ZITHROMAX) 250 MG tablet 2 tablet  on the first day, then 1 tablet daily for 4 days     betamethasone dipropionate (DIPROLENE) 0.05 % cream Apply 1 application topically 2 (two) times daily.     diclofenac Sodium (VOLTAREN) 1 % GEL Apply to affected area 4x a day as directed     fexofenadine (ALLEGRA) 60 MG tablet Take 60 mg by mouth 2 (two) times daily.     fluticasone (FLONASE) 50 MCG/ACT nasal spray SPRAY 2 SPRAYS INTO EACH NOSTRIL EVERY DAY  11   fluticasone furoate-vilanterol (BREO ELLIPTA) 100-25 MCG/ACT AEPB 1 puff     loratadine (CLARITIN) 10 MG tablet TAKE ONE TAB BY MOUTH ONCE DAILY FOR DRAINAGE  AND ALLERGIES     meclizine (ANTIVERT) 12.5 MG tablet 1/2-1 tablet BID as needed for dizziness     Moxifloxacin HCl 0.5 % SOLN 1 drop into affected eye     omeprazole (PRILOSEC) 20 MG capsule Take 1 capsule by mouth 2 (two) times daily.     ondansetron (ZOFRAN) 4 MG tablet Take 4 mg by mouth every 8 (eight) hours as needed for nausea or vomiting.     polyethylene glycol (MIRALAX) 17 g packet Take 17 g by mouth daily as needed.     pravastatin (PRAVACHOL) 40 MG tablet 1 tablet     primidone (MYSOLINE) 50 MG tablet 3 (three) times daily.     propranolol (INDERAL) 40 MG tablet Take 1 tablet (40 mg total) by mouth 2 (two) times daily. 60 tablet 11   sodium chloride (MURO 128) 5 % ophthalmic solution 1 drop into affected eye     tiZANidine (ZANAFLEX) 2 MG tablet 1 tab TID as needed for muscle spasms **cash pay if not covered by insurance**     traMADol (ULTRAM) 50 MG tablet 1 tablet as needed     triamcinolone cream (KENALOG) 0.5 % APPLY TO AFFECTED AREA TWICE A DAY     valsartan-hydrochlorothiazide (DIOVAN-HCT) 80-12.5 MG per tablet Take 1 tablet by mouth daily.  4   No current facility-administered medications for this visit.     ALLERGIES: Dextromethorphan polistirex er, Primidone, Albuterol, Amoxicillin, Aspirin, Atorvastatin, Celecoxib, Clonazepam, Doxycycline hyclate, Levaquin [levofloxacin], Loratadine, Nortriptyline, Propranolol hcl, and Simvastatin  Family History  Problem Relation Age of Onset   Heart failure Father 35   Cancer Mother    Heart attack Sister        2 heart attacks   Breast cancer Neg Hx     Social History   Socioeconomic History   Marital status: Divorced    Spouse name: Not on file   Number of children: 2   Years of education: 12   Highest education level: Not on file  Occupational History   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   Right Handed   1 cup caffeine daily   Social Determinants of Health   Financial Resource Strain: Not on file  Food Insecurity: Not on file  Transportation Needs: Not on file  Physical Activity: Not on file  Stress: Not on file  Social Connections: Not on file  Intimate Partner Violence: Not on file    Review of Systems  Genitourinary:  Positive for pelvic pain (low pelvic pressure) and vaginal pain.  All other systems reviewed and are negative.  PHYSICAL EXAMINATION:    BP 122/70    Pulse 90    Resp 16    Ht 5' 3.5" (1.613 m)    Wt 130 lb (59 kg)    BMI 22.67 kg/m     General appearance: alert, cooperative and appears stated age Head: Normocephalic, without obvious abnormality, atraumatic Neck: no adenopathy, supple, symmetrical, trachea midline and thyroid normal to inspection and palpation Lungs: rhonchi throughout.  Heart: regular rate and rhythm Abdomen: soft, non-tender, no masses,  no organomegaly Extremities: extremities normal, atraumatic, no cyanosis or edema No abnormal inguinal nodes palpated   Pelvic: External genitalia:  no lesions              Urethra:  normal appearing urethra with no masses, tenderness or lesions              Bartholins and Skenes: normal                 Vagina: atrophy noted.  No lesions.               Cervix: no lesions                Bimanual Exam:  Uterus:  normal size, contour, position, consistency, mobility, non-tender.  Bimanual exam limited by patient inability to relax.  Right vaginal apical scarring noted.               Adnexa: no mass, fullness, tenderness              Rectal exam: yes.  Confirms.              Anus:  normal sphincter tone, no lesions  Chaperone was present for exam:  Estill Bamberg, CMA  ASSESSMENT  Vaginal pain.  Pelvic pressure.  Hx right breast cancer, dx in 2004.   PLAN  Wet  prep:  negative yeast, trichomonas, and clue cells. Urinalysis:  sg 1.010, pH 6.0, 6 - 10 WBC, 0 - 2 RBC, 6 - 10 squams, few bacteria, few mucous.  UC sent.  Will wait for the UC to return prior to prescribing any abx.  Pap collected.  Will do pelvic US if vaginal pain returns. She declines this at this time, and I agree that it is not needed now.  Fu prn.    An After Visit Summary was printed and given to the patient.  37 min  total time was spent for this patient encounter, including preparation, face-to-face counseling with the patient, coordination of care, and documentation of the encounter.

## 2021-11-14 DIAGNOSIS — H1820 Unspecified corneal edema: Secondary | ICD-10-CM | POA: Diagnosis not present

## 2021-11-14 DIAGNOSIS — H401131 Primary open-angle glaucoma, bilateral, mild stage: Secondary | ICD-10-CM | POA: Diagnosis not present

## 2021-11-14 LAB — URINALYSIS, COMPLETE W/RFL CULTURE
Bilirubin Urine: NEGATIVE
Glucose, UA: NEGATIVE
Hyaline Cast: NONE SEEN /LPF
Ketones, ur: NEGATIVE
Nitrites, Initial: NEGATIVE
Protein, ur: NEGATIVE
Specific Gravity, Urine: 1.01 (ref 1.001–1.035)
pH: 6 (ref 5.0–8.0)

## 2021-11-14 LAB — URINE CULTURE
MICRO NUMBER:: 12917687
Result:: NO GROWTH
SPECIMEN QUALITY:: ADEQUATE

## 2021-11-14 LAB — CYTOLOGY - PAP: Diagnosis: NEGATIVE

## 2021-11-14 LAB — CULTURE INDICATED

## 2021-11-18 ENCOUNTER — Encounter: Payer: Self-pay | Admitting: Podiatry

## 2021-11-18 ENCOUNTER — Ambulatory Visit: Payer: Medicare PPO | Admitting: Podiatry

## 2021-11-18 ENCOUNTER — Other Ambulatory Visit: Payer: Self-pay

## 2021-11-18 DIAGNOSIS — Q828 Other specified congenital malformations of skin: Secondary | ICD-10-CM | POA: Diagnosis not present

## 2021-11-18 DIAGNOSIS — M79671 Pain in right foot: Secondary | ICD-10-CM | POA: Diagnosis not present

## 2021-11-18 DIAGNOSIS — L84 Corns and callosities: Secondary | ICD-10-CM | POA: Diagnosis not present

## 2021-11-18 DIAGNOSIS — B351 Tinea unguium: Secondary | ICD-10-CM | POA: Diagnosis not present

## 2021-11-18 DIAGNOSIS — M79672 Pain in left foot: Secondary | ICD-10-CM

## 2021-11-18 DIAGNOSIS — M79676 Pain in unspecified toe(s): Secondary | ICD-10-CM

## 2021-11-19 DIAGNOSIS — Z961 Presence of intraocular lens: Secondary | ICD-10-CM | POA: Diagnosis not present

## 2021-11-19 DIAGNOSIS — H1820 Unspecified corneal edema: Secondary | ICD-10-CM | POA: Diagnosis not present

## 2021-11-23 DIAGNOSIS — M791 Myalgia, unspecified site: Secondary | ICD-10-CM | POA: Insufficient documentation

## 2021-11-23 DIAGNOSIS — K5909 Other constipation: Secondary | ICD-10-CM | POA: Insufficient documentation

## 2021-11-23 DIAGNOSIS — R1030 Lower abdominal pain, unspecified: Secondary | ICD-10-CM | POA: Insufficient documentation

## 2021-11-23 DIAGNOSIS — U071 COVID-19: Secondary | ICD-10-CM | POA: Insufficient documentation

## 2021-11-23 NOTE — Progress Notes (Signed)
Subjective:  Patient ID: Danielle Peters, female    DOB: 1940/12/08,  MRN: [id number]  [place] presents to clinic today for callus(es) bilaterally, porokeratosis left 5th toe and painful thick toenails that are difficult to trim. Painful toenails interfere with ambulation. Aggravating factors include wearing enclosed shoe gear. Pain is relieved with periodic professional debridement. Painful calluses are aggravated when weightbearing with and without shoegear. Pain is relieved with periodic professional debridement.  New problem(s): None.   PCP is Avva, Ravisankar, MD , and last visit was 11/04/2021.  Allergies  Allergen Reactions   Dextromethorphan Polistirex Er Other (See Comments)    SOB   Primidone     Severe Headaches   Albuterol     REACTION: tachycardia   Amoxicillin Diarrhea, Nausea Only and Other (See Comments)   Aspirin Other (See Comments)    Adult dose   Atorvastatin Other (See Comments)   Celecoxib Other (See Comments)   Clonazepam Hives and Other (See Comments)   Doxycycline Hyclate Other (See Comments)   Levaquin [Levofloxacin] Other (See Comments)    Per patient, caused tendonitis.    Loratadine    Nortriptyline     Increase heart rate.   Propranolol Hcl Other (See Comments)    Dizziness, Fatgue   Simvastatin     Myalgias     Review of Systems: Negative except as noted in the HPI. Objective:   Constitutional Danielle Peters is a pleasant 81 y.o. African American female, WD, WN in NAD. AAO x 3.   Vascular CFT <3 seconds b/l LE. Palpable DP/PT pulses b/l LE. Digital hair absent b/l. Skin temperature gradient WNL b/l. No pain with calf compression b/l. No edema noted b/l. No cyanosis or clubbing noted b/l LE.  Neurologic Normal speech. Oriented to person, place, and time. Protective sensation intact 5/5 intact bilaterally with 10g monofilament b/l. Vibratory sensation intact b/l.  Dermatologic Pedal skin is warm and supple b/l  LE. No open wounds b/l LE. No interdigital macerations noted b/l LE. Toenails 2-5 bilaterally and R hallux elongated, discolored, dystrophic, thickened, and crumbly with subungual debris and tenderness to dorsal palpation. Anonychia noted L hallux. Nailbed(s) epithelialized.  Hyperkeratotic lesion(s) bilateral heels and bilateral great toes.  No erythema, no edema, no drainage, no fluctuance. Porokeratotic lesion(s) L 5th toe. No erythema, no edema, no drainage, no fluctuance.  Orthopedic: Muscle strength 5/5 to all lower extremity muscle groups bilaterally. Limited joint ROM to the bilateral ankles. Hammertoe(s) noted to the bilateral 5th toes.   Radiographs: None  Last A1c: No flowsheet data found.   Assessment:   1. Pain due to onychomycosis of toenail   2. Corns and callosities   3. Porokeratosis   4. Pain in both feet    Plan:  Patient was evaluated and treated and all questions answered. Consent given for treatment as described below: -Examined patient. -Medicare ABN signed for this year. Patient consents for services of paring of corns/calluses  today. Copy has been placed in patient's chart. -Mycotic toenails 2-5 bilaterally and R hallux were debrided in length and girth with sterile nail nippers and dremel without iatrogenic bleeding. -Callus(es) bilateral heels and bilateral great toes pared utilizing sterile scalpel blade without complication or incident. Total number debrided =4. -Painful porokeratotic lesion(s) L 5th toe pared and enucleated with sterile scalpel blade without incident. Total number of lesions debrided=1. -Patient/POA to call should there be question/concern in the interim.  Return in about 3 months (around 02/15/2022) for nail trim.  Anderson Malta  Tawni Carnes, DPM

## 2021-12-09 DIAGNOSIS — H1820 Unspecified corneal edema: Secondary | ICD-10-CM | POA: Diagnosis not present

## 2021-12-17 DIAGNOSIS — Z961 Presence of intraocular lens: Secondary | ICD-10-CM | POA: Diagnosis not present

## 2021-12-17 DIAGNOSIS — H1820 Unspecified corneal edema: Secondary | ICD-10-CM | POA: Diagnosis not present

## 2021-12-19 IMAGING — DX DG CHEST 2V
2 series · 2 of 2 positions shown · non-contrast
Comparison: July 30, 2020.

CLINICAL DATA: Sarcoidosis.

EXAM:
CHEST - 2 VIEW

[chest pa]
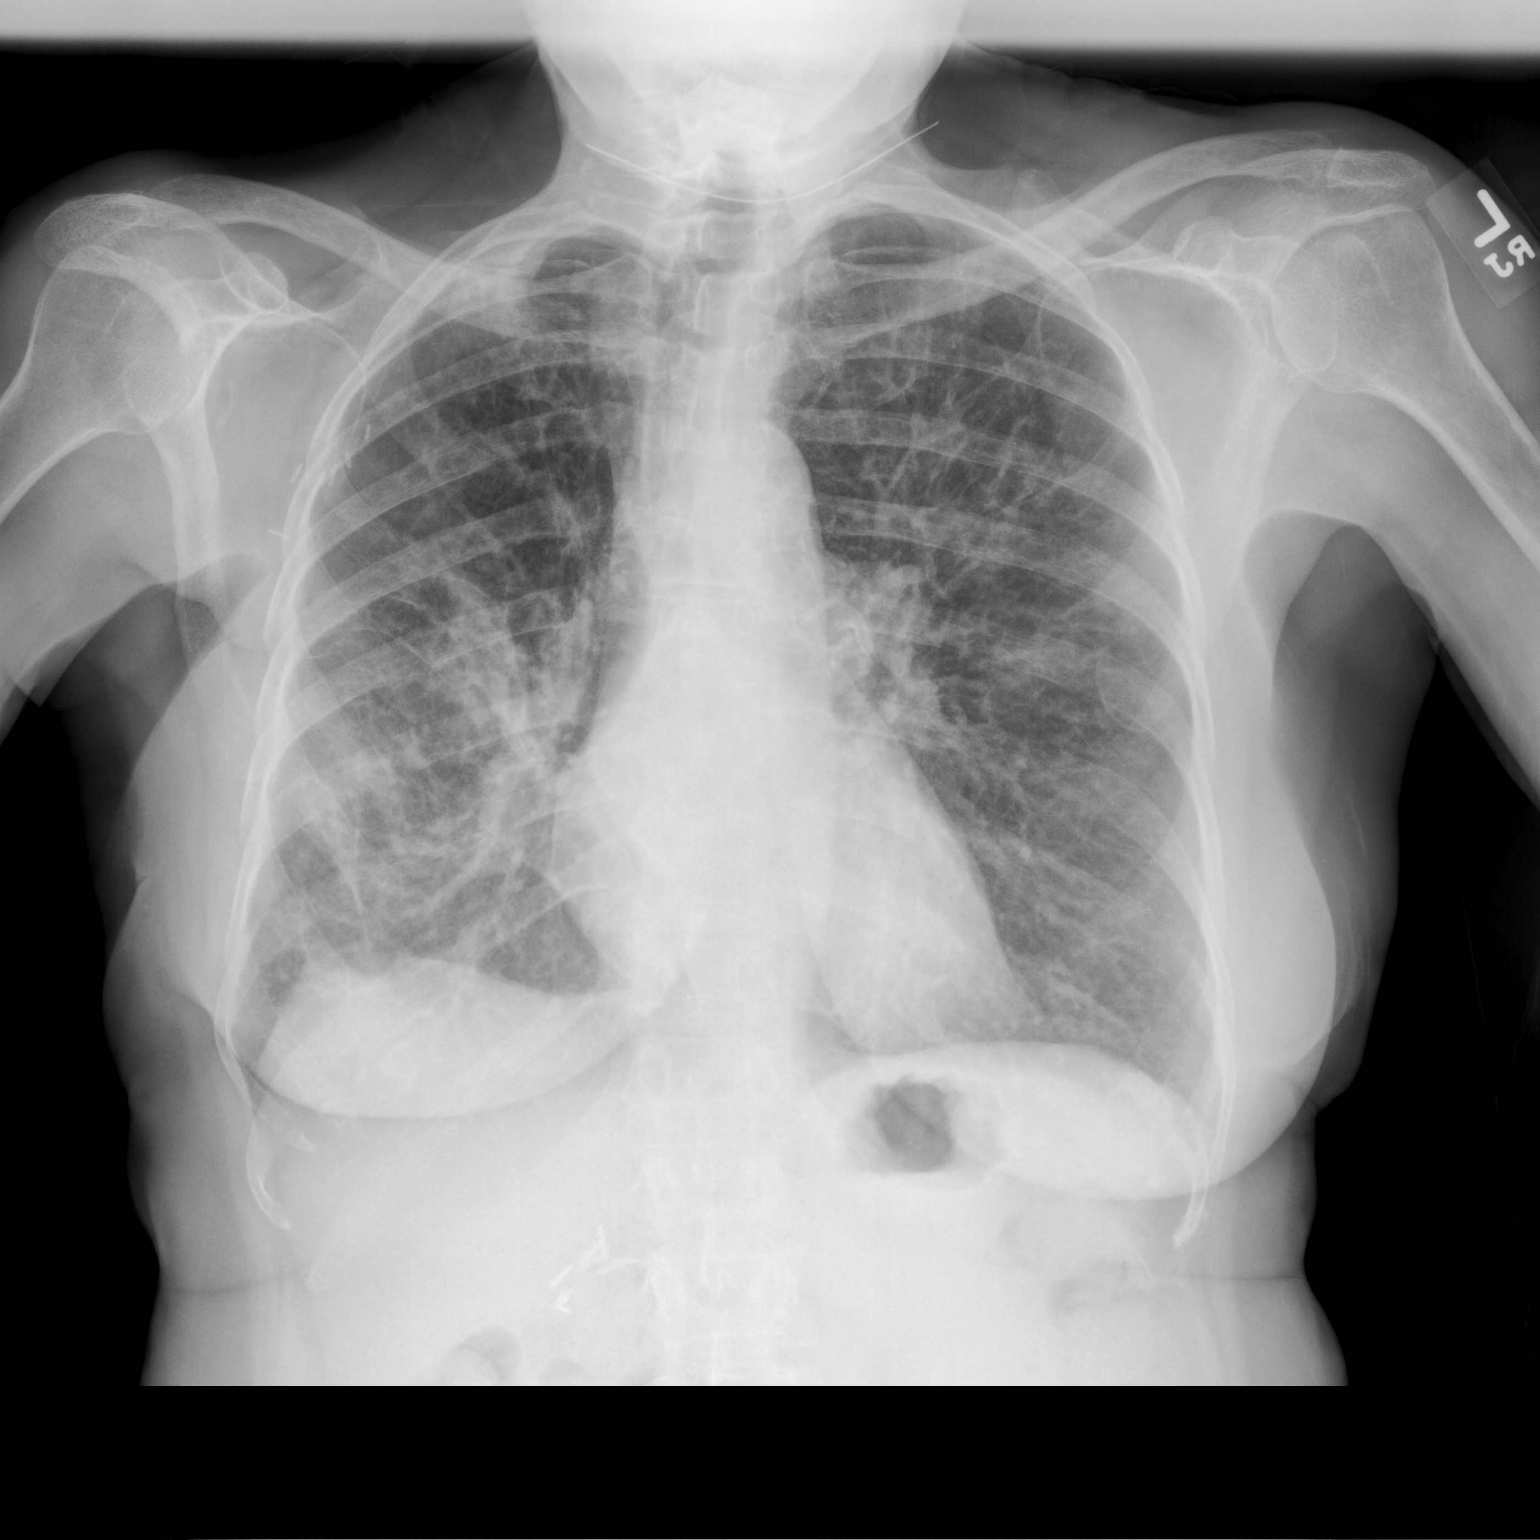

[chest lat]
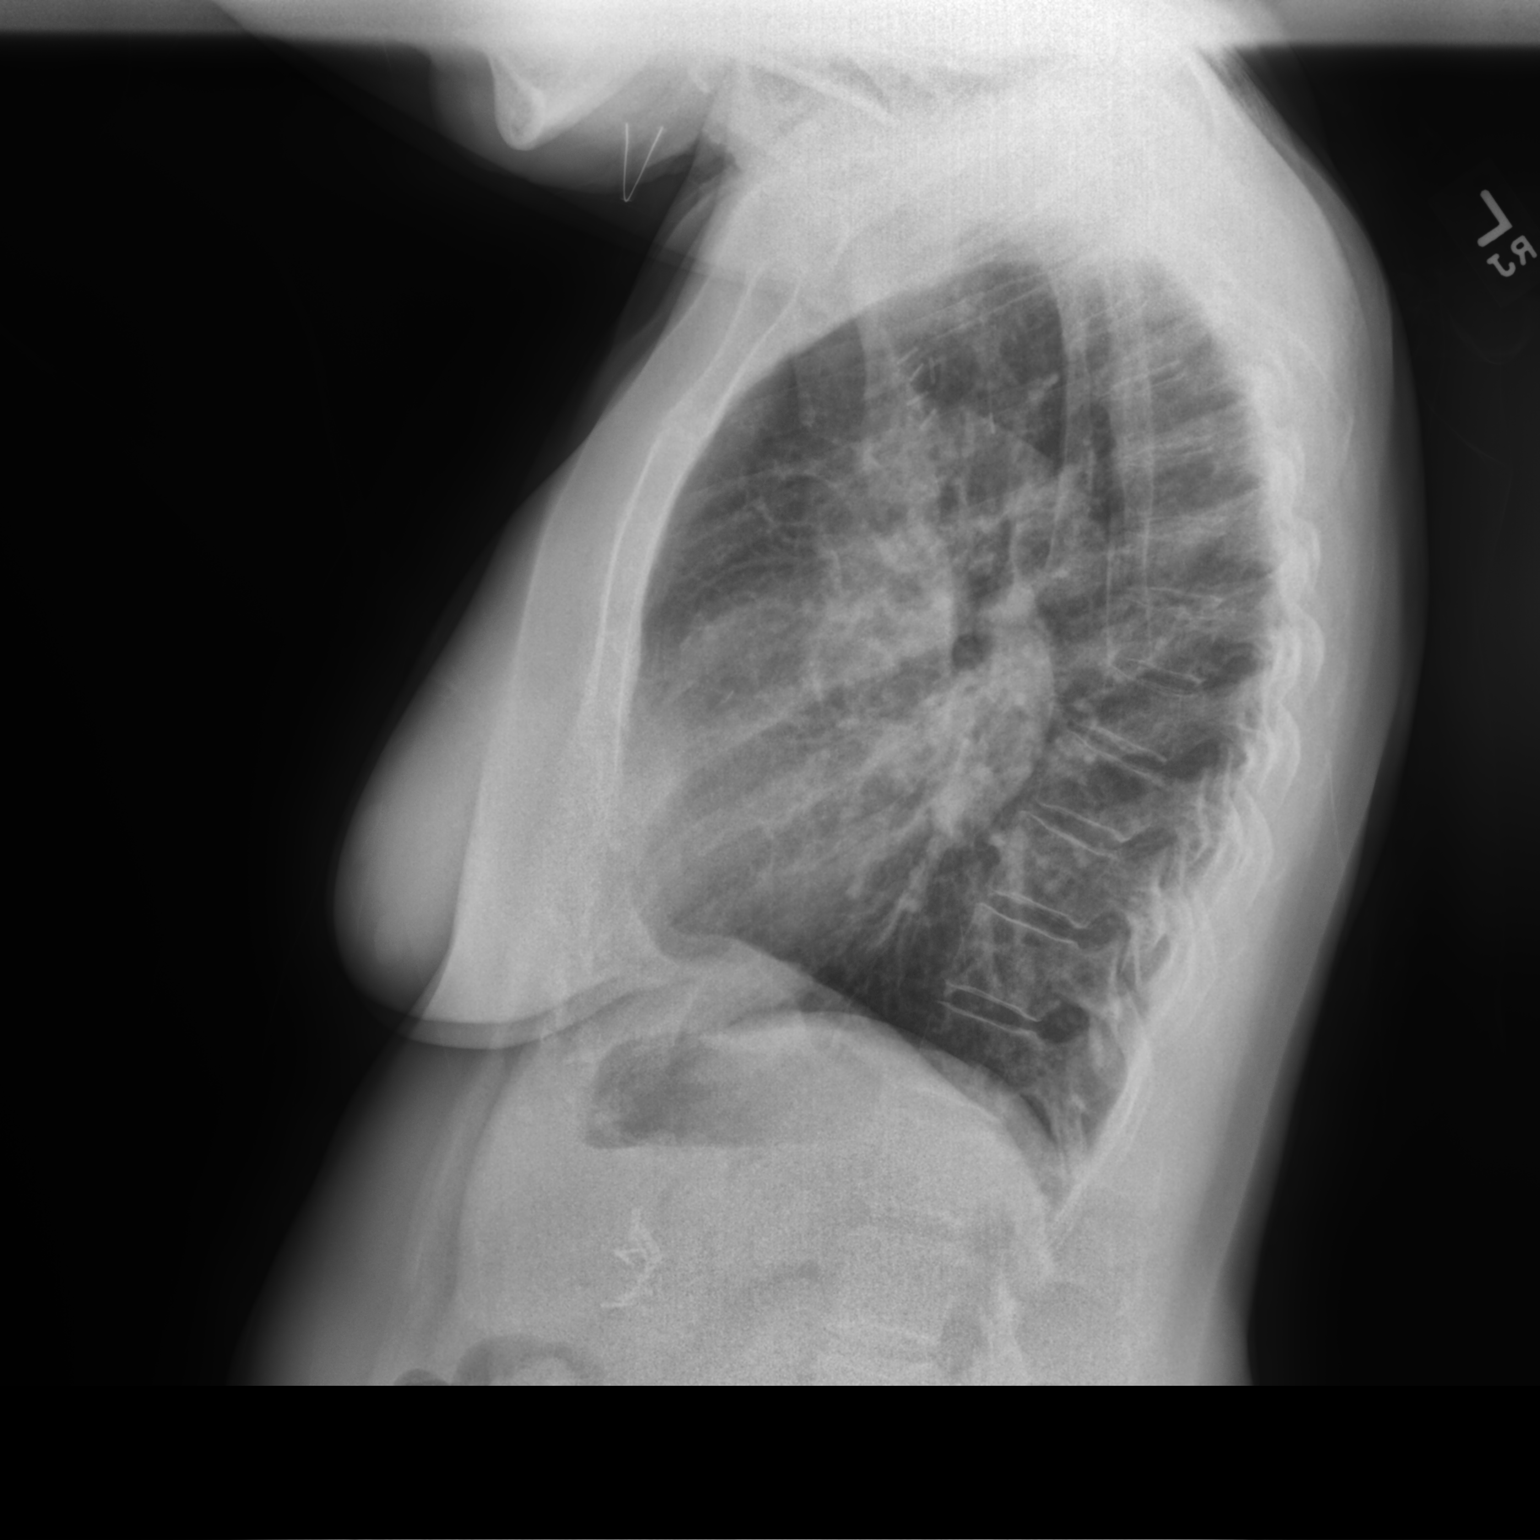

[2 of 2 positions shown; findings below may reference images not displayed]

FINDINGS: The heart size and mediastinal contours are within normal limits. No
pneumothorax or pleural effusion is noted. Stable diffuse chronic
densities are noted throughout both lungs, right greater than left,
consistent with a history of sarcoidosis. No significant changes
noted compared to prior exam. The visualized skeletal structures
are unremarkable.
IMPRESSION: Stable bilateral chronic pulmonary densities as described above,
right greater than left, consistent with sarcoidosis. No significant
changes noted.

## 2021-12-24 DIAGNOSIS — H1820 Unspecified corneal edema: Secondary | ICD-10-CM | POA: Insufficient documentation

## 2022-01-16 DIAGNOSIS — L298 Other pruritus: Secondary | ICD-10-CM | POA: Diagnosis not present

## 2022-01-16 DIAGNOSIS — L853 Xerosis cutis: Secondary | ICD-10-CM | POA: Diagnosis not present

## 2022-01-31 NOTE — Progress Notes (Deleted)
HPI  Female never smoker followed for sarcoid, chronic bronchitis complicated by history of right breast cancer/ XRT, tachycardia, GERD, anxiety, glaucoma, tremor.[name]  PCP Dr Lucien Mons Med Cardiac Study 11/25/16- EF 77%, hyperdynamic, otw  WNL Office Spirometry 12/31/2016-mild restriction of exhaled volume. FVC 1.61/77%, FEV1 1.37/86%, ratio 0.85, FEF 25-75% 2.00/ 144% -----------------------------------------------------------------------------------------.   01/28/21- 81 year old female never smoker followed for Sarcoid, Chronic Bronchitis, accompanied by history right breast CA/XRT, Tachycardia, GERD, anxiety, Glaucoma, Tremor -Breo 100, Spiriva Respimat, Flonase, Zyrtec,  Covid vax-3 Phizer Flu vax-had -----Concerned about mouth breathing Nasal congestion over thee last 2 weeks with some R periorbital pressure, no discharge. No fever. Chronic cough - usually dry or scant white. Actively dislikes nasal sprays.  Describes rash starting as small points over various parts of body. Tends to extend as a patch, then resolve with some hyperpigmentation. Dermatologist biopsied skin of knee and she thinks he told her "herpes". She is not describing a chicken pos or zoster type rash. She was treated with acyclovir. CXR 07/31/20- IMPRESSION: No definite acute cardiopulmonary process. Probable chronic changes of sarcoid.  02/03/22-  81 year old female never smoker followed for Sarcoid, Chronic Bronchitis, accompanied by history right breast CA/XRT, Tachycardia, GERD, anxiety, Glaucoma, Tremor -Breo 100, Spiriva Respimat, Flonase, Zyrtec,  Covid vax-3 Phizer Flu vax-had  CXR 01/28/21-  FINDINGS: The heart size and mediastinal contours are within normal limits. No pneumothorax or pleural effusion is noted. Stable diffuse chronic densities are noted throughout both lungs, right greater than left, consistent with a history of sarcoidosis. No significant changes noted compared to prior exam. The visualized  skeletal structures are unremarkable.  IMPRESSION: Stable bilateral chronic pulmonary densities as described above, right greater than left, consistent with sarcoidosis. No significant changes noted.  ROS- see HPI  + = positive Constitutional:   No-   weight loss, night sweats, fevers, chills,+ fatigue, lassitude. HEENT:   No-  headaches, difficulty swallowing, tooth/dental problems, sore throat,       No-  sneezing, itching, ear ache, nasal congestion, +post nasal drip,  CV:   chest pain, orthopnea, PND, swelling in lower extremities, anasarca, dizziness, palpitations Resp: No-   shortness of breath with exertion or at rest.              productive cough,  + non-productive cough,  No-  coughing up of blood.              No-   change in color of mucus.  No- wheezing.   Skin: +HPI GI:  No-   heartburn, indigestion, abdominal pain, nausea, vomiting,  GU:. MS:  No-   joint pain or swelling.  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 No depression or anxiety.  No memory loss.  Obj General- Alert, Oriented, Affect-appropriate, Distress- none acute,                     + Always slumped, almost kyphotic posture Skin- + hyperpigmentation  Several isolated spots Lymphadenopathy- none Head- atraumatic            Eyes- Gross vision intact, PERRLA, conjunctivae clear secretions            Ears- Hearing, canals-normal            Nose- Clear, no-Septal dev, mucus, polyps, erosion, perforation             Throat- Mallampati II , mucosa clear , drainage- none, tonsils- atrophic. + Dentures  Neck- flexible , trachea midline, no stridor , thyroid nl, carotid no bruit Chest - symmetrical excursion , unlabored           Heart/CV- RRR with skipped beats , no murmur , no gallop  , no rub, nl s1 s2                - JVD- none , edema- none, stasis changes- none, varices- none           Lung- + inspiratory squeaks diffuse ,  Work of breathing is not increased.    cough- none , dullness-none, rub- none           Chest wall- +treatment Scar R breast, Abd- No HSM Br/ Gen/ Rectal- Not done, not indicated Extrem- cyanosis- none, clubbing, none, atrophy- none, strength- nl Neuro- + significant tremor hands

## 2022-02-03 ENCOUNTER — Ambulatory Visit: Payer: Medicare PPO | Admitting: Internal Medicine

## 2022-02-11 ENCOUNTER — Encounter: Payer: Self-pay | Admitting: Cardiovascular Disease

## 2022-02-11 ENCOUNTER — Ambulatory Visit: Payer: Medicare PPO | Admitting: Cardiovascular Disease

## 2022-02-11 VITALS — BP 142/60 | HR 99 | Ht 63.5 in | Wt 134.2 lb

## 2022-02-11 DIAGNOSIS — I471 Supraventricular tachycardia: Secondary | ICD-10-CM | POA: Diagnosis not present

## 2022-02-11 DIAGNOSIS — G250 Essential tremor: Secondary | ICD-10-CM | POA: Diagnosis not present

## 2022-02-11 DIAGNOSIS — I951 Orthostatic hypotension: Secondary | ICD-10-CM | POA: Diagnosis not present

## 2022-02-11 DIAGNOSIS — I10 Essential (primary) hypertension: Secondary | ICD-10-CM

## 2022-02-11 DIAGNOSIS — E7800 Pure hypercholesterolemia, unspecified: Secondary | ICD-10-CM

## 2022-02-11 DIAGNOSIS — I447 Left bundle-branch block, unspecified: Secondary | ICD-10-CM | POA: Diagnosis not present

## 2022-02-11 DIAGNOSIS — D860 Sarcoidosis of lung: Secondary | ICD-10-CM

## 2022-02-11 MED ORDER — VALSARTAN 80 MG PO TABS: 80.0000 mg | ORAL_TABLET | Freq: Every day | ORAL | 3 refills | Status: DC

## 2022-02-11 MED ORDER — EZETIMIBE 10 MG PO TABS: 10.0000 mg | ORAL_TABLET | Freq: Every day | ORAL | 3 refills | Status: DC

## 2022-02-11 MED ORDER — TIZANIDINE HCL 2 MG PO TABS: ORAL_TABLET | ORAL | 0 refills | Status: DC

## 2022-02-11 NOTE — Progress Notes (Signed)
? ?Cardiology Office Note  ? ?Evaluation Performed:  Follow-up visit ? ?Date:  02/11/2022  ? ?ID:  [facility], DOB [birth year], MRN [id number] ? ?PCP:  Prince Solian, MD  ?Cardiologist:  Sanda Klein, MD  ?Electrophysiologist:  None  ? ?Chief Complaint:  HTN, history SVT ? ?History of Present Illness:   ? 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 ? ?Her biggest complaint is dizziness and instability when she stands up.  This happens both when she initially gets up out of a chair, but also she has been standing without moving for a long time.  In the office today she had an orthostatic drop in her systolic blood pressure of about 15 mmHg.  She admits that she does not drink enough water during the day. ? ?She has right-sided chest pain that sounds musculoskeletal.  It is not associate with physical activity.  It is improved since she has changed her cane.  He does not have angina sounding pain.  Does not have orthopnea or PND.  She has wheezing constantly.  She is not using the Breo since this accelerates a heart rate in pleasant fashion.  Her heart rate today was 99 bpm even without using inhalers today.  She has not had problems with lower extremity edema.  She has not had any falls or actual syncope. ? ?Denies any recent problems with sustained palpitations.  She has severe hypercholesterolemia with a most recent LDL of 175, albeit with a good HDL of 74.  She has previously been intolerant of rosuvastatin, atorvastatin and simvastatin.  We tried pravastatin but this caused a sensation of swelling and tenderness in her muscles as well.  She is not taking it any longer. ? ?7 nuclear study showed normal perfusion pattern and normal left ventricular ejection fraction. ? ?She has fairly severe  essential tremor, for which she is taking both propranolol and primidone.  She did not tolerate higher doses of propranolol due to fatigue.  She was taking her primidone with a higher dose of 100 mg in the morning and lower dose 50 mg in the evening, rather than vice versa.  This was causing some sleepiness during the day.  Asked her to change to the higher dose at night. ? ?She has a longstanding history of an active sarcoidosis.  She has an appointment with Dr. Annamaria Boots on May 9. ? ?Past Medical History:  ?Diagnosis Date  ? Anemia   ? Anxiety   ? Breast cancer ([place]) 2004  ? right  ? Cancer [facility])   ? breast ca  ? Chest pain   ? echo 01/08/10- nl lv; mild aortic sclerosis; myoview 01/08/10- no ischemia  ? Dyslipidemia   ? myaligia with statins  ? Esophageal reflux   ? Hypertension   ? Neuropathy   ? Personal history of chemotherapy 2004  ? Personal history of radiation therapy 2004  ? Pulmonary sarcoidosis ([place])   ? Shingles 03/2016  ? Tremor   ? upper extremities  ? Ventricular tachycardia (paroxysmal) ([place])   ? 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; AV node reentry tachycardia ablation   ? ?Past Surgical History:  ?Procedure Laterality Date  ? Boil Right 03/2015  ? on buttock, excision  ? BREAST LUMPECTOMY Right 2004  ? CHOLECYSTECTOMY    ? left breast lumpectomy,chemo  2004  ? xrt  ? SKIN BIOPSY    ?  TUBAL LIGATION    ? UPPER GI ENDOSCOPY  02/2015  ? difficulty swallowing, Abd pain  ?  ? ?Current Meds  ?Medication Sig  ? diclofenac Sodium (VOLTAREN) 1 % GEL Apply to affected area 4x a day as directed  ? ezetimibe (ZETIA) 10 MG tablet Take 1 tablet (10 mg total) by mouth daily.  ? fluticasone (FLONASE) 50 MCG/ACT nasal spray SPRAY 2 SPRAYS INTO EACH NOSTRIL EVERY DAY  ? loratadine (CLARITIN) 10 MG tablet TAKE ONE TAB BY MOUTH ONCE DAILY FOR DRAINAGE AND ALLERGIES  ? Moxifloxacin HCl 0.5 % SOLN 1 drop into affected eye  ? omeprazole (PRILOSEC) 20 MG capsule Take 1 capsule by mouth 2 (two) times daily.  ? polyethylene glycol  (MIRALAX / GLYCOLAX) 17 g packet Take 17 g by mouth daily as needed.  ? primidone (MYSOLINE) 50 MG tablet Take 50 mg by mouth 2 (two) times daily. Take 2 tablets by mouth 2 times daily.  ? sodium chloride (MURO 128) 5 % ophthalmic solution 1 drop into affected eye  ? triamcinolone cream (KENALOG) 0.5 % APPLY TO AFFECTED AREA TWICE A DAY  ? valsartan (DIOVAN) 80 MG tablet Take 1 tablet (80 mg total) by mouth daily.  ? [DISCONTINUED] valsartan-hydrochlorothiazide (DIOVAN-HCT) 80-12.5 MG per tablet Take 1 tablet by mouth daily.  ?  ? ?Allergies:   Dextromethorphan polistirex er, Primidone, Albuterol, Amoxicillin, Aspirin, Atorvastatin, Celecoxib, Clonazepam, Doxycycline hyclate, Levaquin [levofloxacin], Loratadine, Nortriptyline, Propranolol hcl, and Simvastatin  ? ?Social History  ? ?Tobacco Use  ? Smoking status: Never  ? Smokeless tobacco: Never  ?Substance Use Topics  ? Alcohol use: No  ? Drug use: No  ?  ? ?Family Hx: ?The patient's family history includes Cancer in her mother; Heart attack in her sister; Heart failure (age of onset: 80) in her father. There is no history of Breast cancer. ? ?ROS:   ?Please see the history of present illness.   All other systems are reviewed and are negative.  ? ?Prior CV studies:   ?The following studies were reviewed today: ?Pharmacological stress test November 28, 2018 ? ?Labs/Other Tests and Data Reviewed:   ? ?EKG: Ordered today and personally reviewed, shows normal sinus rhythm, left bundle branch block (120 ms QRS duration), QTc 462 ms, not meaningfully changed from previous tracings. ? ?Recent Labs: ?No results found for requested labs within last 8760 hours.  ? ?Recent Lipid Panel ?No results found for: CHOL, TRIG, HDL, CHOLHDL, LDLCALC, LDLDIRECT ? ?05/17/2020 ?Chol 277, HDL 88, LDL170, TG 94 ?Creatinine 0.8, hemoglobin 12.8, TSH 1.34 ?07/18/2021 ?Cholesterol 271, HDL 74, LDL 175, triglycerides 112 ?Hemoglobin A1c 5.8%, potassium 3.8, ALT 13 ? ?Wt Readings from Last 3  Encounters:  ?02/11/22 134 lb 3.2 oz (60.9 kg)  ?11/12/21 130 lb (59 kg)  ?06/10/21 139 lb 8 oz (63.3 kg)  ?  ? ?Objective:   ? ?Vital Signs:  BP (!) 142/60 (BP Location: Left Arm, Patient Position: Sitting, Cuff Size: Normal)   Pulse 99   Ht 5' 3.5" (1.613 m)   Wt 134 lb 3.2 oz (60.9 kg)   SpO2 97%   BMI 23.40 kg/m?   ? ?Recheck blood pressure sitting 150/61 mmHg, standing 135/60 mmHg ? ?General: Alert, oriented x3, no distress, lean and frail ?Head: no evidence of trauma, PERRL, EOMI, no exophtalmos or lid lag, no myxedema, no xanthelasma; normal ears, nose and oropharynx ?Neck: normal jugular venous pulsations and no hepatojugular reflux; brisk carotid pulses without delay and no carotid bruits ?Chest: Few  bilateral rhonchi and wheezes, better than her usual exam ?Cardiovascular: normal position and quality of the apical impulse, regular rhythm, normal first and paradoxically split second heart sounds, no murmurs, rubs or gallops ?Abdomen: no tenderness or distention, no masses by palpation, no abnormal pulsatility or arterial bruits, normal bowel sounds, no hepatosplenomegaly ?Extremities: no clubbing, cyanosis or edema; 2+ radial, ulnar and brachial pulses bilaterally; 2+ right femoral, posterior tibial and dorsalis pedis pulses; 2+ left femoral, posterior tibial and dorsalis pedis pulses; no subclavian or femoral bruits ?Neurological: grossly nonfocal other than resting tremor ?Psych: Normal mood and affect ? ? ? ?ASSESSMENT & PLAN:   ? ?1. Orthostatic hypotension   ?2. Essential hypertension   ?3. SVT (supraventricular tachycardia) ([place])   ?4. Hypercholesterolemia   ?5. LBBB (left bundle branch block)   ?6. Pulmonary sarcoidosis ([place])   ?7. Essential tremor   ? ? ? ?Orthostatic hypotension: Also has a very broad pulse pressure with a low diastolic pressure.  We will try to discontinue her hydrochlorothiazide ingredient and keep her on just valsartan and propranolol.  Asked her to send [place] blood pressure  readings in a couple of weeks.  Would tolerate systolic blood pressure as high as 150 mmHg to avoid symptomatic hypotension and falls.   ?Palpitations: History of SVT.  On a tiny dose of propranolol without recurrence rece

## 2022-02-11 NOTE — Patient Instructions (Signed)
Medication Instructions:  ?START Zetia 10 mg once daily ?START Valsartan 80 mg once daily ? ?STOP the Valsartan-HCTZ ? ? ?*If you need a refill on your cardiac medications before your next appointment, please call your pharmacy* ? ? ?Lab Work: ?None ordered ?If you have labs (blood work) drawn today and your tests are completely normal, you will receive your results only by: ?MyChart Message (if you have MyChart) OR ?A paper copy in the mail ?If you have any lab test that is abnormal or we need to change your treatment, we will call you to review the results. ? ? ?Testing/Procedures: ?None ordered ? ? ?Follow-Up: ?At [facility], you and your health needs are our priority.  As part of our continuing mission to provide you with exceptional heart care, we have created designated Provider Care Teams.  These Care Teams include your primary Cardiologist (physician) and Advanced Practice Providers (APPs -  Physician Assistants and Nurse Practitioners) who all work together to provide you with the care you need, when you need it. ? ?We recommend signing up for the patient portal called "MyChart".  Sign up information is provided on this After Visit Summary.  MyChart is used to connect with patients for Virtual Visits (Telemedicine).  Patients are able to view lab/test results, encounter notes, upcoming appointments, etc.  Non-urgent messages can be sent to your provider as well.   ?To learn more about what you can do with MyChart, go to [email or web address].   ? ?Your next appointment:   ?12 month(s) ? ?The format for your next appointment:   ?In Person ? ?Provider:   ?Sanda Klein, MD { ? ?Dr. Sallyanne Kuster would like you to check your blood pressure daily for the next 2 weeks.  Keep a journal of these daily blood pressure and heart rate readings and call our office or send a message through [place] with the results. Thank you! ? ?It is best to check your BP 1-2 hours after taking your medications to see the  medications effectiveness on your BP.  ?  ?Here are some tips that our clinical pharmacists share for home BP monitoring: ??         Rest 10 minutes before taking your blood pressure. ??         Don't smoke or drink caffeinated beverages for at least 30 minutes before. ??         Take your blood pressure before (not after) you eat. ??         Sit comfortably with your back supported and both feet on the floor (don't cross your legs). ??         Elevate your arm to heart level on a table or a desk. ??         Use the proper sized cuff. It should fit smoothly and snugly around your bare upper arm. There should be enough room to slip a fingertip under the cuff. The bottom edge of the cuff should be 1 inch above the crease of the elbow. ? ?

## 2022-02-17 ENCOUNTER — Ambulatory Visit: Payer: Medicare PPO | Admitting: Podiatry

## 2022-02-17 ENCOUNTER — Encounter: Payer: Self-pay | Admitting: Podiatry

## 2022-02-17 DIAGNOSIS — Q828 Other specified congenital malformations of skin: Secondary | ICD-10-CM | POA: Diagnosis not present

## 2022-02-17 DIAGNOSIS — M79671 Pain in right foot: Secondary | ICD-10-CM | POA: Diagnosis not present

## 2022-02-17 DIAGNOSIS — L84 Corns and callosities: Secondary | ICD-10-CM | POA: Diagnosis not present

## 2022-02-17 DIAGNOSIS — M79672 Pain in left foot: Secondary | ICD-10-CM

## 2022-02-17 DIAGNOSIS — B351 Tinea unguium: Secondary | ICD-10-CM | POA: Diagnosis not present

## 2022-02-17 DIAGNOSIS — M79674 Pain in right toe(s): Secondary | ICD-10-CM

## 2022-02-17 DIAGNOSIS — M79675 Pain in left toe(s): Secondary | ICD-10-CM | POA: Diagnosis not present

## 2022-02-21 NOTE — Progress Notes (Signed)
HPI  Female never smoker followed for sarcoid, chronic bronchitis complicated by history of right breast cancer/ XRT, tachycardia, GERD, anxiety, glaucoma, tremor.[name]  PCP Dr Lucien Mons Med Cardiac Study 11/25/16- EF 77%, hyperdynamic, otw  WNL Office Spirometry 12/31/2016-mild restriction of exhaled volume. FVC 1.61/77%, FEV1 1.37/86%, ratio 0.85, FEF 25-75% 2.00/ 144% -----------------------------------------------------------------------------------------.   01/28/21- 81 year old female never smoker followed for Sarcoid, Chronic Bronchitis, accompanied by history right breast CA/XRT, Tachycardia, GERD, anxiety, Glaucoma, Tremor -Breo 100, Spiriva Respimat, Flonase, Zyrtec,  Covid vax-3 Phizer Flu vax-had -----Concerned about mouth breathing Nasal congestion over thee last 2 weeks with some R periorbital pressure, no discharge. No fever. Chronic cough - usually dry or scant white. Actively dislikes nasal sprays.  Describes rash starting as small points over various parts of body. Tends to extend as a patch, then resolve with some hyperpigmentation. Dermatologist biopsied skin of knee and she thinks he told her "herpes". She is not describing a chicken pox or zoster type rash. She was treated with acyclovir. CXR 07/31/20- IMPRESSION: No definite acute cardiopulmonary process. Probable chronic changes of sarcoid.  02/24/22--  81 year old female never smoker followed for Sarcoid, Chronic Bronchitis, accompanied by history right breast CA/XRT, Tachycardia, GERD, anxiety, Glaucoma, Tremor -Breo 100, Spiriva Respimat, Flonase, Claritin,  Covid vax-3 Phizer Flu vax-had -----Patient would like to talk about inhaler and allergy medicine She has been using Claritin recently this spring but does not think it works well enough.  Had previously used Zyrtec and we discussed retrying that while she continues Flonase. May be some increasing chest tightness with some wheeze.  No obvious acute infection.  Baseline  anxiety somewhat increased because she is pending corneal surgery. CXR 01/28/21-  FINDINGS: The heart size and mediastinal contours are within normal limits. No pneumothorax or pleural effusion is noted. Stable diffuse chronic densities are noted throughout both lungs, right greater than left, consistent with a history of sarcoidosis. No significant changes noted compared to prior exam. The visualized skeletal structures are unremarkable.  IMPRESSION: Stable bilateral chronic pulmonary densities as described above, right greater than left, consistent with sarcoidosis. No significant changes noted.  ROS- see HPI  + = positive Constitutional:   No-   weight loss, night sweats, fevers, chills,+ fatigue, lassitude. HEENT:   No-  headaches, difficulty swallowing, tooth/dental problems, sore throat,       No-  sneezing, itching, ear ache, nasal congestion, +post nasal drip,  CV:   chest pain, orthopnea, PND, swelling in lower extremities, anasarca, dizziness, palpitations Resp: No-   shortness of breath with exertion or at rest.              productive cough,  + non-productive cough,  No-  coughing up of blood.              No-   change in color of mucus.  +wheezing.   Skin: +HPI GI:  No-   heartburn, indigestion, abdominal pain, nausea, vomiting,  GU:. MS:  No-   joint pain or swelling.  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 No depression or anxiety.  No memory loss.  Obj General- Alert, Oriented, Affect-appropriate, Distress- none acute,                     + Always slumped, almost kyphotic posture Skin- + hyperpigmentation  Several isolated spots Lymphadenopathy- none Head- atraumatic            Eyes- Gross vision  intact, PERRLA, conjunctivae clear secretions            Ears- Hearing, canals-normal            Nose- Clear, no-Septal dev, mucus, polyps, erosion, perforation             Throat- Mallampati II , mucosa clear , drainage-  none, tonsils- atrophic. + Dentures Neck- flexible , trachea midline, no stridor , thyroid nl, carotid no bruit Chest - symmetrical excursion , unlabored           Heart/CV- RRR with skipped beats , no murmur , no gallop  , no rub, nl s1 s2                - JVD- none , edema- none, stasis changes- none, varices- none           Lung- + inspiratory squeaks diffuse ,  Work of breathing is not increased.   cough- none , dullness-none, rub- none           Chest wall- +treatment Scar R breast, Abd- No HSM Br/ Gen/ Rectal- Not done, not indicated Extrem- cyanosis- none, clubbing, none, atrophy- none, strength- nl Neuro- + significant tremor hands

## 2022-02-24 ENCOUNTER — Encounter: Payer: Self-pay | Admitting: Internal Medicine

## 2022-02-24 ENCOUNTER — Ambulatory Visit: Payer: Medicare PPO | Admitting: Internal Medicine

## 2022-02-24 VITALS — BP 146/60 | HR 95

## 2022-02-24 DIAGNOSIS — J302 Other seasonal allergic rhinitis: Secondary | ICD-10-CM

## 2022-02-24 DIAGNOSIS — D869 Sarcoidosis, unspecified: Secondary | ICD-10-CM

## 2022-02-24 DIAGNOSIS — D860 Sarcoidosis of lung: Secondary | ICD-10-CM

## 2022-02-24 DIAGNOSIS — J3089 Other allergic rhinitis: Secondary | ICD-10-CM | POA: Diagnosis not present

## 2022-02-24 DIAGNOSIS — J441 Chronic obstructive pulmonary disease with (acute) exacerbation: Secondary | ICD-10-CM

## 2022-02-24 MED ORDER — ALBUTEROL SULFATE (2.5 MG/3ML) 0.083% IN NEBU: 2.5000 mg | INHALATION_SOLUTION | Freq: Once | RESPIRATORY_TRACT | Status: AC

## 2022-02-24 MED ORDER — FLUTICASONE FUROATE-VILANTEROL 100-25 MCG/ACT IN AEPB: 1.0000 | INHALATION_SPRAY | Freq: Every day | RESPIRATORY_TRACT | 12 refills | Status: DC

## 2022-02-24 MED ORDER — MECLIZINE HCL 25 MG PO TABS: 25.0000 mg | ORAL_TABLET | Freq: Three times a day (TID) | ORAL | 2 refills | Status: AC | PRN

## 2022-02-24 NOTE — Patient Instructions (Signed)
Order- neb treatment albuterol    dx Chronic Bronchitis ? ?Sample or Rx Breo 100     inhale 1 puff then rinse mouth, once daily ? ?Suggest you try otc Zyrtec antihistamine once daily as needed for runny nose ? ?Suggest you use your Flonase nasal spray   2 sprays each nostril once daily at bedtime ? ?Script sent for meclizine ?

## 2022-02-26 NOTE — Progress Notes (Signed)
?  Subjective:  ?Patient ID: Danielle Peters, female    DOB: February 08, 1941,  MRN: [id number] ? ?[place] presents to clinic today for callus(es) b/l lower extremities, porokeratotic lesion(s) L 5th toe, and painful mycotic nails. Painful toenails interfere with ambulation. Aggravating factors include wearing enclosed shoe gear. Pain is relieved with periodic professional debridement. Painful callus(es) and porokeratotic lesion(s) are aggravated when weightbearing with and without shoegear. Pain is relieved with periodic professional debridement. ? ?New problem(s): None.  ? ?PCP is Avva, Ravisankar, MD , and last visit was February, 2023. ? ?Allergies  ?Allergen Reactions  ? Dextromethorphan Polistirex Er Other (See Comments)  ?  SOB  ? Primidone   ?  Severe Headaches  ? Albuterol   ?  REACTION: tachycardia  ? Amoxicillin Diarrhea, Nausea Only and Other (See Comments)  ? Aspirin Other (See Comments)  ?  Adult dose  ? Atorvastatin Other (See Comments)  ? Celecoxib Other (See Comments)  ? Clonazepam Hives and Other (See Comments)  ? Doxycycline Hyclate Other (See Comments)  ? Levaquin [Levofloxacin] Other (See Comments)  ?  Per patient, caused tendonitis.   ? Loratadine   ? Nortriptyline   ?  Increase heart rate.  ? Propranolol Hcl Other (See Comments)  ?  Dizziness, Fatgue  ? Simvastatin   ?  Myalgias ?  ? ? ?Review of Systems: Negative except as noted in the HPI. ? ?Objective: No changes noted in today's physical examination. ?Constitutional Danielle Peters is a pleasant 81 y.o. African American female, WD, WN in NAD. AAO x 3.   ?Vascular CFT <3 seconds b/l LE. Palpable DP/PT pulses b/l LE. Digital hair absent b/l. Skin temperature gradient WNL b/l. No pain with calf compression b/l. No edema noted b/l. No cyanosis or clubbing noted b/l LE.  ?Neurologic Normal speech. Oriented to person, place, and time. Protective sensation intact 5/5 intact bilaterally with 10g monofilament b/l. Vibratory  sensation intact b/l.  ?Dermatologic Pedal skin is warm and supple b/l LE. No open wounds b/l LE. No interdigital macerations noted b/l LE. Toenails 2-5 bilaterally and R hallux elongated, discolored, dystrophic, thickened, and crumbly with subungual debris and tenderness to dorsal palpation. Anonychia noted L hallux. Nailbed(s) epithelialized.  Hyperkeratotic lesion(s) bilateral heels and bilateral great toes. No erythema, no edema, no drainage, no fluctuance. Porokeratotic lesion(s) L 5th toe. No erythema, no edema, no drainage, no fluctuance.  ?Orthopedic: Muscle strength 5/5 to all lower extremity muscle groups bilaterally. Limited joint ROM to the bilateral ankles. Hammertoe(s) noted to the bilateral 5th toes.  ? ?Radiographs: None ? ?Assessment/Plan: ?1. Pain due to onychomycosis of toenail   ?2. Porokeratosis   ?3. Callus   ?4. Pain in both feet   ?  ?-Consent given for treatment as described below: ?-Medicare ABN on file for consent for paring of calluses. ?-Toenails 1-5 b/l were debrided in length and girth with sterile nail nippers and dremel without iatrogenic bleeding.  ?-Callus(es) bilateral heels and bilateral great toes pared utilizing sterile scalpel blade without complication or incident. Total number debrided =4. ?-Porokeratotic lesion(s) left fifth digit pared and enucleated with sterile scalpel blade without incident. Total number of lesions debrided=1. ?-Patient/POA to call should there be question/concern in the interim.  ? ?Return in about 3 months (around 05/20/2022). ? ?Marzetta Board, DPM  ?

## 2022-03-05 DIAGNOSIS — H1820 Unspecified corneal edema: Secondary | ICD-10-CM | POA: Diagnosis not present

## 2022-03-05 DIAGNOSIS — H538 Other visual disturbances: Secondary | ICD-10-CM | POA: Diagnosis not present

## 2022-03-05 DIAGNOSIS — H1811 Bullous keratopathy, right eye: Secondary | ICD-10-CM | POA: Diagnosis not present

## 2022-03-05 DIAGNOSIS — T85398A Other mechanical complication of other ocular prosthetic devices, implants and grafts, initial encounter: Secondary | ICD-10-CM | POA: Diagnosis not present

## 2022-03-06 DIAGNOSIS — H11421 Conjunctival edema, right eye: Secondary | ICD-10-CM | POA: Diagnosis not present

## 2022-03-06 DIAGNOSIS — Z888 Allergy status to other drugs, medicaments and biological substances status: Secondary | ICD-10-CM | POA: Diagnosis not present

## 2022-03-06 DIAGNOSIS — Z886 Allergy status to analgesic agent status: Secondary | ICD-10-CM | POA: Diagnosis not present

## 2022-03-06 DIAGNOSIS — Z880 Allergy status to penicillin: Secondary | ICD-10-CM | POA: Diagnosis not present

## 2022-03-06 DIAGNOSIS — Z862 Personal history of diseases of the blood and blood-forming organs and certain disorders involving the immune mechanism: Secondary | ICD-10-CM | POA: Diagnosis not present

## 2022-03-06 DIAGNOSIS — Z48810 Encounter for surgical aftercare following surgery on the sense organs: Secondary | ICD-10-CM | POA: Diagnosis not present

## 2022-03-06 DIAGNOSIS — H1820 Unspecified corneal edema: Secondary | ICD-10-CM | POA: Diagnosis not present

## 2022-03-06 DIAGNOSIS — Z961 Presence of intraocular lens: Secondary | ICD-10-CM | POA: Diagnosis not present

## 2022-03-06 DIAGNOSIS — Z881 Allergy status to other antibiotic agents status: Secondary | ICD-10-CM | POA: Diagnosis not present

## 2022-03-13 DIAGNOSIS — Z48810 Encounter for surgical aftercare following surgery on the sense organs: Secondary | ICD-10-CM | POA: Diagnosis not present

## 2022-03-13 DIAGNOSIS — Z87898 Personal history of other specified conditions: Secondary | ICD-10-CM | POA: Diagnosis not present

## 2022-03-13 DIAGNOSIS — Z947 Corneal transplant status: Secondary | ICD-10-CM | POA: Diagnosis not present

## 2022-03-13 DIAGNOSIS — Z79899 Other long term (current) drug therapy: Secondary | ICD-10-CM | POA: Diagnosis not present

## 2022-03-24 DIAGNOSIS — L309 Dermatitis, unspecified: Secondary | ICD-10-CM | POA: Diagnosis not present

## 2022-03-24 DIAGNOSIS — L989 Disorder of the skin and subcutaneous tissue, unspecified: Secondary | ICD-10-CM | POA: Diagnosis not present

## 2022-03-24 DIAGNOSIS — L03115 Cellulitis of right lower limb: Secondary | ICD-10-CM | POA: Diagnosis not present

## 2022-03-24 DIAGNOSIS — D485 Neoplasm of uncertain behavior of skin: Secondary | ICD-10-CM | POA: Diagnosis not present

## 2022-03-24 DIAGNOSIS — L139 Bullous disorder, unspecified: Secondary | ICD-10-CM | POA: Diagnosis not present

## 2022-03-24 DIAGNOSIS — L120 Bullous pemphigoid: Secondary | ICD-10-CM | POA: Diagnosis not present

## 2022-03-27 ENCOUNTER — Telehealth: Payer: Self-pay | Admitting: Cardiovascular Disease

## 2022-03-27 DIAGNOSIS — L309 Dermatitis, unspecified: Secondary | ICD-10-CM | POA: Diagnosis not present

## 2022-03-27 NOTE — Telephone Encounter (Signed)
Patient and daughter have been made aware.

## 2022-03-27 NOTE — Telephone Encounter (Signed)
Some of those readings are little higher than desirable, but none of them are dangerous.  Lets wait to see what the next blood pressure log shows, checked after medications.  No changes to the meds at this time.

## 2022-03-27 NOTE — Telephone Encounter (Signed)
Patient called to give her BP readings as requested: 4/25 123/65 4/29 165/77 5/2 133/66 HR 81 5/3 132/63 HR 80 5/5 127/70 HR 81 5/9 157/77 HR 85 5/10 136/68 HR 75 5/12 126/62 HR 78 5/15 142/71 HR 74 5/22 146/65 HR 87 5/23 153/78 HR 74 5/26 146/80 HR 88 6/2 156/78 HR 74 6/7 132/72 HR 81

## 2022-03-27 NOTE — Telephone Encounter (Signed)
Spoke to the patient's daughter. She stated that the blood pressures listed were taken first thing in the mornings before her medications. She has been asked to keep a log of her blood pressures 1-2 hours after taking her medications as well.

## 2022-03-31 NOTE — Assessment & Plan Note (Signed)
Seasonal rhinitis exacerbation related to spring pollen season. Plan-we discussed Flonase and antihistamine options.  She is going to go back to trying Zyrtec.

## 2022-03-31 NOTE — Assessment & Plan Note (Signed)
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  No progressive process. Plan-observation

## 2022-03-31 NOTE — Assessment & Plan Note (Signed)
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  I cannot tell if she actually is a little worse acutely, or simply anxious about upcoming surgery. Plan-nebulizer treatment albuterol, sample trial Breo 100 with discussion

## 2022-04-03 ENCOUNTER — Ambulatory Visit: Payer: Medicare PPO | Admitting: Internal Medicine

## 2022-04-28 DIAGNOSIS — L510 Nonbullous erythema multiforme: Secondary | ICD-10-CM | POA: Diagnosis not present

## 2022-04-29 DIAGNOSIS — L120 Bullous pemphigoid: Secondary | ICD-10-CM | POA: Diagnosis not present

## 2022-05-12 ENCOUNTER — Encounter: Payer: Self-pay | Admitting: Podiatry

## 2022-05-12 ENCOUNTER — Ambulatory Visit: Payer: Medicare PPO | Admitting: Podiatry

## 2022-05-12 DIAGNOSIS — Q828 Other specified congenital malformations of skin: Secondary | ICD-10-CM | POA: Diagnosis not present

## 2022-05-12 DIAGNOSIS — M79672 Pain in left foot: Secondary | ICD-10-CM

## 2022-05-12 DIAGNOSIS — B351 Tinea unguium: Secondary | ICD-10-CM | POA: Diagnosis not present

## 2022-05-12 DIAGNOSIS — M79671 Pain in right foot: Secondary | ICD-10-CM

## 2022-05-12 DIAGNOSIS — M79676 Pain in unspecified toe(s): Secondary | ICD-10-CM

## 2022-05-12 DIAGNOSIS — L84 Corns and callosities: Secondary | ICD-10-CM | POA: Diagnosis not present

## 2022-05-17 NOTE — Progress Notes (Signed)
  Subjective:  Patient ID: Danielle Peters, female    DOB: 1941-04-11,  MRN: [id number]  Ector presents to clinic today for callus(es) bilateral heels, porokeratotic lesion(s) left fifth digit, and painful mycotic nails. Painful toenails interfere with ambulation. Aggravating factors include wearing enclosed shoe gear. Pain is relieved with periodic professional debridement. Painful callus(es) and porokeratotic lesion(s) are aggravated when weightbearing with and without shoegear. Pain is relieved with periodic professional debridement.  New problem(s): None.   PCP is Avva, Steva Ready, MD , and last visit was  February, 2023.  Allergies  Allergen Reactions   Dextromethorphan Polistirex Er Other (See Comments)    SOB   Primidone     Severe Headaches   Albuterol     REACTION: tachycardia   Amoxicillin Diarrhea, Nausea Only and Other (See Comments)   Aspirin Other (See Comments)    Adult dose   Atorvastatin Other (See Comments)   Celecoxib Other (See Comments)   Clonazepam Hives and Other (See Comments)   Doxycycline Hyclate Other (See Comments)   Levaquin [Levofloxacin] Other (See Comments)    Per patient, caused tendonitis.    Loratadine    Nortriptyline     Increase heart rate.   Propranolol Hcl Other (See Comments)    Dizziness, Fatgue   Simvastatin     Myalgias     Review of Systems: Negative except as noted in the HPI.  Objective: No changes noted in today's physical examination. Constitutional Danielle Peters is a pleasant 81 y.o. African American female, WD, WN in NAD. AAO x 3.   Vascular CFT <3 seconds b/l LE. Palpable DP/PT pulses b/l LE. Digital hair absent b/l. Skin temperature gradient WNL b/l. No pain with calf compression b/l. No edema noted b/l. No cyanosis or clubbing noted b/l LE.  Neurologic Normal speech. Oriented to person, place, and time. Protective sensation intact 5/5 intact bilaterally with 10g monofilament b/l.  Vibratory sensation intact b/l.  Dermatologic Pedal skin is warm and supple b/l LE. No open wounds b/l LE. No interdigital macerations noted b/l LE. Toenails 2-5 bilaterally and R hallux elongated, discolored, dystrophic, thickened, and crumbly with subungual debris and tenderness to dorsal palpation. Anonychia noted L hallux. Nailbed(s) epithelialized.  Hyperkeratotic lesion(s) bilateral heels and bilateral great toes. No erythema, no edema, no drainage, no fluctuance. Porokeratotic lesion(s) L 5th toe. No erythema, no edema, no drainage, no fluctuance.  Orthopedic: Muscle strength 5/5 to all lower extremity muscle groups bilaterally. Limited joint ROM to the bilateral ankles. Hammertoe(s) noted to the bilateral 5th toes.   Radiographs: None  Assessment/Plan: 1. Pain due to onychomycosis of toenail   2. Porokeratosis   3. Callus   4. Pain in both feet      -Patient was evaluated and treated. All patient's and/or POA's questions/concerns answered on today's visit. -Medicare ABN signed for services of paring of corn(s)/callus(es)/porokeratos(es). Copy in patient chart. -Patient to continue soft, supportive shoe gear daily. -Mycotic toenails 2-5 bilaterally and R hallux were debrided in length and girth with sterile nail nippers and dremel without iatrogenic bleeding. -Callus(es) bilateral heels and bilateral great toes pared utilizing sterile scalpel blade without complication or incident. Total number debrided =4. -Porokeratotic lesion(s) L 5th toe pared and enucleated with sterile scalpel blade without incident. Total number of lesions debrided=1. -Patient/POA to call should there be question/concern in the interim.   Return in about 3 months (around 08/12/2022).  Marzetta Board, DPM

## 2022-05-26 ENCOUNTER — Ambulatory Visit: Payer: Medicare PPO | Admitting: Podiatry

## 2022-06-10 DIAGNOSIS — H4051X3 Glaucoma secondary to other eye disorders, right eye, severe stage: Secondary | ICD-10-CM | POA: Diagnosis not present

## 2022-06-23 ENCOUNTER — Other Ambulatory Visit: Payer: Self-pay

## 2022-06-23 ENCOUNTER — Emergency Department (HOSPITAL_COMMUNITY): Payer: Medicare PPO

## 2022-06-23 ENCOUNTER — Encounter (HOSPITAL_COMMUNITY): Payer: Self-pay

## 2022-06-23 ENCOUNTER — Emergency Department (HOSPITAL_COMMUNITY)
Admission: EM | Admit: 2022-06-23 | Discharge: 2022-06-23 | Disposition: A | Discharge status: Home / Self Care | Payer: Medicare PPO | Attending: Emergency Medicine | Admitting: Emergency Medicine

## 2022-06-23 DIAGNOSIS — R6883 Chills (without fever): Secondary | ICD-10-CM | POA: Diagnosis not present

## 2022-06-23 DIAGNOSIS — R079 Chest pain, unspecified: Secondary | ICD-10-CM | POA: Insufficient documentation

## 2022-06-23 DIAGNOSIS — Z20822 Contact with and (suspected) exposure to covid-19: Secondary | ICD-10-CM | POA: Diagnosis not present

## 2022-06-23 DIAGNOSIS — Z79899 Other long term (current) drug therapy: Secondary | ICD-10-CM | POA: Insufficient documentation

## 2022-06-23 DIAGNOSIS — I4510 Unspecified right bundle-branch block: Secondary | ICD-10-CM | POA: Diagnosis not present

## 2022-06-23 DIAGNOSIS — I10 Essential (primary) hypertension: Secondary | ICD-10-CM | POA: Insufficient documentation

## 2022-06-23 DIAGNOSIS — Z7982 Long term (current) use of aspirin: Secondary | ICD-10-CM | POA: Diagnosis not present

## 2022-06-23 DIAGNOSIS — R0789 Other chest pain: Secondary | ICD-10-CM | POA: Diagnosis not present

## 2022-06-23 DIAGNOSIS — I499 Cardiac arrhythmia, unspecified: Secondary | ICD-10-CM | POA: Diagnosis not present

## 2022-06-23 LAB — CBC WITH DIFFERENTIAL/PLATELET
Abs Immature Granulocytes: 0.01 10*3/uL (ref 0.00–0.07)
Basophils Absolute: 0 10*3/uL (ref 0.0–0.1)
Basophils Relative: 1 %
Eosinophils Absolute: 0.1 10*3/uL (ref 0.0–0.5)
Eosinophils Relative: 1 %
HCT: 41.5 % (ref 36.0–46.0)
Hemoglobin: 13.5 g/dL (ref 12.0–15.0)
Immature Granulocytes: 0 %
Lymphocytes Relative: 27 %
Lymphs Abs: 1.8 10*3/uL (ref 0.7–4.0)
MCH: 27.8 pg (ref 26.0–34.0)
MCHC: 32.5 g/dL (ref 30.0–36.0)
MCV: 85.6 fL (ref 80.0–100.0)
Monocytes Absolute: 0.8 10*3/uL (ref 0.1–1.0)
Monocytes Relative: 12 %
Neutro Abs: 3.9 10*3/uL (ref 1.7–7.7)
Neutrophils Relative %: 59 %
Platelets: 201 10*3/uL (ref 150–400)
RBC: 4.85 MIL/uL (ref 3.87–5.11)
RDW: 13.9 % (ref 11.5–15.5)
WBC: 6.6 10*3/uL (ref 4.0–10.5)
nRBC: 0 % (ref 0.0–0.2)

## 2022-06-23 LAB — COMPREHENSIVE METABOLIC PANEL
ALT: 11 U/L (ref 0–44)
AST: 22 U/L (ref 15–41)
Albumin: 3.4 g/dL — ABNORMAL LOW (ref 3.5–5.0)
Alkaline Phosphatase: 98 U/L (ref 38–126)
Anion gap: 13 (ref 5–15)
BUN: 9 mg/dL (ref 8–23)
CO2: 22 mmol/L (ref 22–32)
Calcium: 9.5 mg/dL (ref 8.9–10.3)
Chloride: 105 mmol/L (ref 98–111)
Creatinine, Ser: 0.86 mg/dL (ref 0.44–1.00)
GFR, Estimated: >60 mL/min (ref >60)
Glucose, Bld: 105 mg/dL — ABNORMAL HIGH (ref 70–99)
Potassium: 3.5 mmol/L (ref 3.5–5.1)
Sodium: 140 mmol/L (ref 135–145)
Total Bilirubin: 0.4 mg/dL (ref 0.3–1.2)
Total Protein: 6.6 g/dL (ref 6.5–8.1)

## 2022-06-23 LAB — TROPONIN I (HIGH SENSITIVITY)
Troponin I (High Sensitivity): 8 ng/L (ref <18)
Troponin I (High Sensitivity): 14 ng/L (ref <18)

## 2022-06-23 LAB — SARS CORONAVIRUS 2 BY RT PCR: SARS Coronavirus 2 by RT PCR: NEGATIVE

## 2022-06-23 MED ORDER — ACETAMINOPHEN 325 MG PO TABS
650.0000 mg | ORAL_TABLET | Freq: Once | ORAL | Status: AC
  Administered 2022-06-23: 650 mg via ORAL
  Filled 2022-06-23: qty 2

## 2022-06-23 NOTE — ED Provider Notes (Signed)
[facility] EMERGENCY DEPARTMENT Provider Note   CSN: [id number] Arrival date & time: 06/23/22  1645     History  Chief Complaint  Patient presents with   Chest Pain    Danielle Peters is a 81 y.o. female hx of resting tremors, hypertension, here presenting with chest pain.  Patient has been having chest pain since yesterday.  Patient also has some subjective chills.  She states that the pain is left-sided and radiates to her neck.  She had similar episode previously and has seen cardiology and was told to have noncardiac chest pain.  She actually had a stress test with cardiology about a year ago that was unremarkable.  Denies any CAD or stents.  Patient states that she has some chills as well.  She states that she did go to church on Sunday but denies any COVID exposures.  She called home health nurse and was told to come here for further evaluation.  Patient was given aspirin by EMS and states that her pain has resolved now.  The history is provided by the patient.       Home Medications Prior to Admission medications   Medication Sig Start Date End Date Taking? Authorizing Provider  ALPRAZolam Duanne Moron) 0.25 MG tablet  08/14/20   [provider]  aspirin 81 MG tablet Take 81 mg by mouth every other day.    [provider]  betamethasone dipropionate (DIPROLENE) 0.05 % cream Apply 1 application. topically 2 (two) times daily. 09/23/18   [provider]  diclofenac Sodium (VOLTAREN) 1 % GEL Apply to affected area 4x a day as directed 03/31/21   [provider]  ezetimibe (ZETIA) 10 MG tablet Take 1 tablet (10 mg total) by mouth daily. 02/11/22 02/06/23  Croitoru, Mihai, MD  fexofenadine (ALLEGRA) 60 MG tablet Take 60 mg by mouth 2 (two) times daily. Patient not taking: Reported on 02/11/2022    [provider]  fluticasone (FLONASE) 50 MCG/ACT nasal spray SPRAY 2 SPRAYS INTO EACH NOSTRIL EVERY DAY 08/09/17   [provider]  fluticasone furoate-vilanterol (BREO ELLIPTA) 100-25 MCG/ACT AEPB 1 puff Patient not taking: Reported on 02/11/2022    [provider]  fluticasone furoate-vilanterol (BREO ELLIPTA) 100-25 MCG/ACT AEPB Inhale 1 puff into the lungs daily. Rinse mouth 02/24/22   Baird Lyons D, MD  loratadine (CLARITIN) 10 MG tablet TAKE ONE TAB BY MOUTH ONCE DAILY FOR DRAINAGE AND ALLERGIES    [provider]  meclizine (ANTIVERT) 12.5 MG tablet 1/2-1 tablet BID as needed for dizziness Patient not taking: Reported on 02/11/2022 11/10/19   [provider]  meclizine (ANTIVERT) 25 MG tablet Take 1 tablet (25 mg total) by mouth 3 (three) times daily as needed for dizziness. 02/24/22   Baird Lyons D, MD  Moxifloxacin HCl 0.5 % SOLN 1 drop into affected eye    [provider]  omeprazole (PRILOSEC) 20 MG capsule Take 1 capsule by mouth 2 (two) times daily.    [provider]  ondansetron (ZOFRAN) 4 MG tablet Take 4 mg by mouth every 8 (eight) hours as needed for nausea or vomiting.    [provider]  polyethylene glycol (MIRALAX / GLYCOLAX) 17 g packet Take 17 g by mouth daily as needed.    [provider]  primidone (MYSOLINE) 50 MG tablet Take 50 mg by mouth 2 (two) times daily. Take 2 tablets by mouth 2 times daily.    [provider]  sodium chloride (  MURO 128) 5 % ophthalmic solution 1 drop into affected eye    [provider]  tiZANidine (ZANAFLEX) 2 MG tablet 1 tab TID as needed for muscle spasms **cash pay if not covered by insurance** 02/11/22   Croitoru, Mihai, MD  triamcinolone cream (KENALOG) 0.5 % APPLY TO AFFECTED AREA TWICE A DAY    [provider]  valsartan (DIOVAN) 80 MG tablet Take 1 tablet (80 mg total) by mouth daily. 02/11/22   Croitoru, Mihai, MD      Allergies    Dextromethorphan polistirex er, Primidone, Albuterol, Amoxicillin, Aspirin, Atorvastatin, Celecoxib, Clonazepam, Doxycycline hyclate,  Levaquin [levofloxacin], Loratadine, Nortriptyline, Propranolol hcl, and Simvastatin    Review of Systems   Review of Systems  Cardiovascular:  Positive for chest pain.  All other systems reviewed and are negative.   Physical Exam Updated Vital Signs BP (!) 179/76   Pulse 97   Temp 99.1 F (37.3 C) (Oral)   Resp 17   Ht '5\' 3"'$  (1.6 m)   Wt 60.9 kg   SpO2 100%   BMI 23.78 kg/m  Physical Exam Vitals and nursing note reviewed.  Constitutional:      Comments: Chronically ill   HENT:     Head: Normocephalic.  Eyes:     Extraocular Movements: Extraocular movements intact.     Pupils: Pupils are equal, round, and reactive to light.  Cardiovascular:     Rate and Rhythm: Normal rate and regular rhythm.     Heart sounds: Normal heart sounds.  Pulmonary:     Breath sounds: Normal breath sounds.  Abdominal:     General: Bowel sounds are normal.     Palpations: Abdomen is soft.  Musculoskeletal:        General: Normal range of motion.     Cervical back: Normal range of motion and neck supple.  Skin:    General: Skin is warm.     Capillary Refill: Capillary refill takes less than 2 seconds.  Neurological:     General: No focal deficit present.     Mental Status: She is oriented to person, place, and time.  Psychiatric:        Mood and Affect: Mood normal.        Behavior: Behavior normal.     ED Results / Procedures / Treatments   Labs (all labs ordered are listed, but only abnormal results are displayed) Labs Reviewed  SARS CORONAVIRUS 2 BY RT PCR  CBC WITH DIFFERENTIAL/PLATELET  COMPREHENSIVE METABOLIC PANEL  TROPONIN I (HIGH SENSITIVITY)    EKG EKG Interpretation  Date/Time:  Tuesday June 23 2022 16:46:13 EDT Ventricular Rate:  95 PR Interval:  125 QRS Duration: 134 QT Interval:  383 QTC Calculation: 482 R Axis:   -13 Text Interpretation: Sinus rhythm LAE, consider biatrial enlargement Left bundle branch block LBBB new since previous Confirmed by Wandra Arthurs [phone]) on 06/23/2022 5:00:25 PM  Radiology No results found.  Procedures Procedures    Medications Ordered in ED Medications  acetaminophen (TYLENOL) tablet 650 mg (has no administration in time range)    ED Course/ Medical Decision Making/ A&P                           Medical Decision Making Danielle Peters is a 81 y.o. female here with chest pain and chills.  Patient has been having chest pain since yesterday.  Also went to church on Sunday and has some chills.  Consider COVID versus musculoskeletal pain versus ACS.  Plan to get CBC and CMP and COVID test and troponin x2 and chest x-ray.   8:26 PM Troponin negative x2.  COVID test is negative.  Stable for discharge  Problems Addressed: Chest pain, unspecified type: acute illness or injury  Amount and/or Complexity of Data Reviewed Labs: ordered. Decision-making details documented in ED Course. Radiology: ordered and independent interpretation performed. Decision-making details documented in ED Course. ECG/medicine tests: ordered and independent interpretation performed. Decision-making details documented in ED Course.  Risk OTC drugs.    Final Clinical Impression(s) / ED Diagnoses Final diagnoses:  None    Rx / DC Orders ED Discharge Orders     None         Drenda Freeze, MD 06/23/22 2026

## 2022-06-23 NOTE — ED Triage Notes (Signed)
Intermittent CP for 24 Hrs.  Home health nurse was there today and stated that she should get checked out stated to ems when fire gave her ASA it made the pain better no radiation and unable to reproduce the pain and improve it otherwise

## 2022-06-23 NOTE — Discharge Instructions (Signed)
Your heart enzymes are normal right now.  If you have persistent chest pain, please follow-up with Dr. Sallyanne Kuster  Return to ER if you have worse chest pain, trouble breathing

## 2022-06-29 NOTE — Progress Notes (Deleted)
Cardiology Office Note:    Date:  06/29/2022   ID:  Danielle Peters, DOB 1941-03-14, MRN [id number]  PCP:  Prince Solian, [place] Cardiologist: Sanda Klein, MD   Reason for visit: Follow-up chest pain  History of Present Illness:    Danielle Peters is a 81 y.o. 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  She last saw Dr. Dr. Sallyanne Kuster in April 2023.  Her biggest complaint is dizziness and instability when she stands up.  She had orthostatic drop in systolic blood pressure of 15 mmHg.  Dr. Sallyanne Kuster noted right-sided chest pain that sounds musculoskeletal.  Improved since she changed her cane.  Dr. Sallyanne Kuster recommend stopping her HCTZ.  She remained on low-dose propanolol for palpitations and SVT.  Started on Zetia for hyperlipidemia and history of statin intolerance.  Patient went to the ED on September 5 with chest pain ranging her neck and subjective chills.  Troponin negative.  COVID-negative.  Discharged home.  She had a low risk Lexiscan stress test in March 2020.  Precordial pain -***  Orthostatic hypotension -***  Palpitations History of SVT -***  Hypertension -*** -Goal BP is <130/80.  Recommend DASH diet (high in vegetables, fruits, low-fat dairy products, whole grains, poultry, fish, and nuts and low in sweets, sugar-sweetened beverages, and red meats), salt restriction and increase physical activity.  Hyperlipidemia -Intolerant of atorvastatin/Silva/Rasuvo/pravastatin.  We will start Zetia 10 mg daily although this will not be sufficient to bring her LDL to target. -*** -Discussed cholesterol lowering diets - Mediterranean diet, DASH diet, vegetarian diet, low-carbohydrate diet and avoidance of trans fats.  Discussed healthier choice  substitutes.  Nuts, high-fiber foods, and fiber supplements may also improve lipids.    Obesity -Discussed how even a 5-10% weight loss can have cardiovascular benefits.   -Recommend moderate intensity activity for 30 minutes 5 days/week and the DASH diet.  Tobacco use  -Recommend tobacco cessation.  Reviewed physiologic effects of nicotine and the immediate-eventual benefits of quitting including improvement in cough/breathing and reduction in cardiovascular events.  Discussed quitting tips such as removing triggers and getting support from family/friends and Quitline [place]. -USPSTF recommends one-time screening for abdominal aortic aneurysm (AAA) by ultrasound in men 37 -8 years old who have ever smoked.      Disposition - Follow-up in ***     Past Medical History:  Diagnosis Date   Anemia    Anxiety    Breast cancer ([place]) 2004   right   Cancer [facility])    breast ca   Chest pain    echo 01/08/10- nl lv; mild aortic sclerosis; myoview 01/08/10- no ischemia   Dyslipidemia    myaligia with statins   Esophageal reflux    Hypertension    Neuropathy    Personal history of chemotherapy 2004   Personal history of radiation therapy 2004   Pulmonary sarcoidosis ([place])    Shingles 03/2016   Tremor    upper extremities   Ventricular tachycardia (paroxysmal) ([place])    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; AV node reentry tachycardia ablation     Past Surgical History:  Procedure Laterality Date   Boil Right 03/2015   on buttock, excision   BREAST LUMPECTOMY Right 2004   CHOLECYSTECTOMY     left breast lumpectomy,[place]  2004   xrt   SKIN BIOPSY     TUBAL  LIGATION     UPPER GI ENDOSCOPY  02/2015   difficulty swallowing, Abd pain    Current Medications: No outpatient medications have been marked as taking for the 07/01/22 encounter (Appointment) with Warren Lacy, PA-C.   Current Facility-Administered Medications for the 07/01/22 encounter (Appointment) with Warren Lacy, PA-C   Medication   albuterol (PROVENTIL) (2.5 MG/3ML) 0.083% nebulizer solution 2.5 mg     Allergies:   Dextromethorphan polistirex er, Primidone, Albuterol, Amoxicillin, Aspirin, Atorvastatin, Celecoxib, Clonazepam, Doxycycline hyclate, Levaquin [levofloxacin], Loratadine, Nortriptyline, Propranolol hcl, and Simvastatin   Social History   Socioeconomic History   Marital status: Divorced    Spouse name: Not on file   Number of children: 2   Years of education: 12   Highest education level: Not on file  Occupational History   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   Right Handed   1 cup caffeine daily   Social Determinants of Health   Financial Resource Strain: Not on file  Food Insecurity: Not on file  Transportation Needs: Not on file  Physical Activity: Not on file  Stress: Not on file  Social Connections: Not on file     Family History: The patient's family history includes Cancer in her mother; Heart attack in her sister; Heart failure (age of onset: 6) in her father. There is no history of Breast cancer.  ROS:   Please see the history of present illness.     EKGs/Labs/Other Studies Reviewed:    EKG:  The ekg ordered today demonstrates ***  Recent Labs: 06/23/2022: ALT 11; BUN 9; Creatinine, Ser 0.86; Hemoglobin 13.5; Platelets 201; Potassium 3.5; Sodium 140   Recent Lipid Panel No results found for: "CHOL", "TRIG", "HDL", "LDLCALC", "LDLDIRECT"  Physical Exam:    VS:  There were no vitals taken for this visit.   No data found.  No BP recorded.  {Refresh Note OR Click here to enter BP  :1}***    Wt Readings from Last 3 Encounters:  06/23/22 134 lb 4.2 oz (60.9 kg)   02/11/22 134 lb 3.2 oz (60.9 kg)  11/12/21 130 lb (59 kg)     GEN: *** Well nourished, well developed in no acute distress HEENT: Normal NECK: No JVD; No carotid bruits CARDIAC: ***RRR, no murmurs, rubs, gallops RESPIRATORY:  Clear to auscultation without rales, wheezing or rhonchi  ABDOMEN: Soft, non-tender, non-distended MUSCULOSKELETAL: No edema; No deformity  SKIN: Warm and dry NEUROLOGIC:  Alert and oriented PSYCHIATRIC:  Normal affect     ASSESSMENT AND PLAN   ***   {Are you ordering a CV Procedure (e.g. stress test, cath, DCCV, TEE, etc)?   Press F2        :[id number]}    Medication Adjustments/Labs and Tests Ordered: Current medicines are reviewed at length with the patient today.  Concerns regarding medicines are outlined above.  No orders of the defined types were placed in this encounter.  No orders of the defined types were placed in this encounter.   There are no Patient Instructions on file for this visit.   Signed, Warren Lacy, PA-C  06/29/2022 5:04 PM  [facility]

## 2022-07-01 ENCOUNTER — Ambulatory Visit: Payer: Medicare PPO | Attending: Physician Assistant | Admitting: Physician Assistant

## 2022-07-01 DIAGNOSIS — E7800 Pure hypercholesterolemia, unspecified: Secondary | ICD-10-CM

## 2022-07-01 DIAGNOSIS — I471 Supraventricular tachycardia: Secondary | ICD-10-CM

## 2022-07-01 DIAGNOSIS — I951 Orthostatic hypotension: Secondary | ICD-10-CM

## 2022-07-01 DIAGNOSIS — I10 Essential (primary) hypertension: Secondary | ICD-10-CM

## 2022-07-01 DIAGNOSIS — R072 Precordial pain: Secondary | ICD-10-CM

## 2022-07-28 ENCOUNTER — Other Ambulatory Visit: Payer: Self-pay | Admitting: Neurology

## 2022-07-28 DIAGNOSIS — U071 COVID-19: Secondary | ICD-10-CM | POA: Diagnosis not present

## 2022-07-28 DIAGNOSIS — J029 Acute pharyngitis, unspecified: Secondary | ICD-10-CM | POA: Diagnosis not present

## 2022-07-28 DIAGNOSIS — R051 Acute cough: Secondary | ICD-10-CM | POA: Diagnosis not present

## 2022-07-28 DIAGNOSIS — D869 Sarcoidosis, unspecified: Secondary | ICD-10-CM | POA: Diagnosis not present

## 2022-07-28 DIAGNOSIS — J309 Allergic rhinitis, unspecified: Secondary | ICD-10-CM | POA: Diagnosis not present

## 2022-07-28 DIAGNOSIS — Z1152 Encounter for screening for COVID-19: Secondary | ICD-10-CM | POA: Diagnosis not present

## 2022-07-28 DIAGNOSIS — R0981 Nasal congestion: Secondary | ICD-10-CM | POA: Diagnosis not present

## 2022-07-28 DIAGNOSIS — J449 Chronic obstructive pulmonary disease, unspecified: Secondary | ICD-10-CM | POA: Diagnosis not present

## 2022-07-28 DIAGNOSIS — Z8616 Personal history of COVID-19: Secondary | ICD-10-CM | POA: Diagnosis not present

## 2022-07-29 DIAGNOSIS — H4051X3 Glaucoma secondary to other eye disorders, right eye, severe stage: Secondary | ICD-10-CM | POA: Diagnosis not present

## 2022-07-29 DIAGNOSIS — Z947 Corneal transplant status: Secondary | ICD-10-CM | POA: Diagnosis not present

## 2022-07-29 DIAGNOSIS — Z961 Presence of intraocular lens: Secondary | ICD-10-CM | POA: Diagnosis not present

## 2022-08-04 DIAGNOSIS — H6123 Impacted cerumen, bilateral: Secondary | ICD-10-CM | POA: Diagnosis not present

## 2022-08-04 DIAGNOSIS — M26622 Arthralgia of left temporomandibular joint: Secondary | ICD-10-CM | POA: Insufficient documentation

## 2022-08-04 DIAGNOSIS — H903 Sensorineural hearing loss, bilateral: Secondary | ICD-10-CM | POA: Diagnosis not present

## 2022-08-21 ENCOUNTER — Ambulatory Visit: Payer: Medicare PPO | Admitting: Podiatry

## 2022-08-21 ENCOUNTER — Encounter: Payer: Self-pay | Admitting: Podiatry

## 2022-08-21 DIAGNOSIS — M79662 Pain in left lower leg: Secondary | ICD-10-CM | POA: Diagnosis not present

## 2022-08-21 DIAGNOSIS — Q828 Other specified congenital malformations of skin: Secondary | ICD-10-CM

## 2022-08-21 DIAGNOSIS — M79676 Pain in unspecified toe(s): Secondary | ICD-10-CM | POA: Diagnosis not present

## 2022-08-21 DIAGNOSIS — L84 Corns and callosities: Secondary | ICD-10-CM

## 2022-08-21 DIAGNOSIS — B351 Tinea unguium: Secondary | ICD-10-CM

## 2022-08-21 DIAGNOSIS — I809 Phlebitis and thrombophlebitis of unspecified site: Secondary | ICD-10-CM

## 2022-08-21 DIAGNOSIS — M79661 Pain in right lower leg: Secondary | ICD-10-CM | POA: Diagnosis not present

## 2022-08-21 DIAGNOSIS — I82403 Acute embolism and thrombosis of unspecified deep veins of lower extremity, bilateral: Secondary | ICD-10-CM

## 2022-08-21 MED ORDER — SULFAMETHOXAZOLE-TRIMETHOPRIM 800-160 MG PO TABS: 1.0000 | ORAL_TABLET | Freq: Two times a day (BID) | ORAL | 0 refills | Status: DC

## 2022-08-21 NOTE — Progress Notes (Signed)
Subjective:  Patient ID: Danielle Peters, female    DOB: 1941/01/22,  MRN: [id number]  [place] presents to clinic today for  Chief Complaint  Patient presents with   routine foot care     Swelling in bilateral feet, problem has been going on for a few weeks    Patient states she has been having swelling and pain in both lower extremities. Caregiver present states it has been about 2 weeks. Upon further questioning, patient has been wearing compression hose to bed at night and removing them in the morning.  She also notes pain at the medial aspect of her right ankle. Denies any chest pain or shortness of breath.  PCP is Avva, Steva Ready, MD , and last visit was July 31, 2022.  Allergies  Allergen Reactions   Dextromethorphan Polistirex Er Other (See Comments)    SOB   Primidone     Severe Headaches   Albuterol     REACTION: tachycardia   Amoxicillin Diarrhea, Nausea Only and Other (See Comments)   Aspirin Other (See Comments)    Adult dose   Atorvastatin Other (See Comments)   Celecoxib Other (See Comments)   Clonazepam Hives and Other (See Comments)   Doxycycline Hyclate Other (See Comments)   Levaquin [Levofloxacin] Other (See Comments)    Per patient, caused tendonitis.    Loratadine    Nortriptyline     Increase heart rate.   Propranolol Hcl Other (See Comments)    Dizziness, Fatgue   Simvastatin     Myalgias    Ezetimibe Rash    Review of Systems: Negative except as noted in the HPI.  Objective:   Isaac Lacson Dunker is a pleasant 81 y.o. female WD, WN in NAD. AAO x 3. Vascular CFT <3 seconds b/l LE.   Palpable DP/PT pulses b/l LE. Digital hair absent b/l. Skin temperature gradient WNL b/l.   + pain with calf compression b/l.   +1 edema noted b/l. No cyanosis or clubbing noted b/l LE.  Neurologic Normal speech. Oriented to person, place, and time. Protective sensation intact 5/5 intact bilaterally with 10g monofilament b/l.  Vibratory sensation intact b/l.  Dermatologic Increased warmth noted medial aspect right ankle with pain and hyperpigmentation. Concern for early cellulitis.  No open wounds b/l LE. No interdigital macerations noted b/l LE.   Toenails 2-5 bilaterally and R hallux elongated, discolored, dystrophic, thickened, and crumbly with subungual debris and tenderness to dorsal palpation. Anonychia noted L hallux. Nailbed(s) epithelialized.    Hyperkeratotic lesion(s) bilateral heels and bilateral great toes. No erythema, no edema, no drainage, no fluctuance.   Porokeratotic lesion(s) L 5th toe. No erythema, no edema, no drainage, no fluctuance.  Orthopedic: Muscle strength 5/5 to all lower extremity muscle groups bilaterally. Limited joint ROM to the bilateral ankles. Hammertoe(s) noted to the bilateral 5th toes.   Radiographs: None Assessment/Plan: 1. Pain due to onychomycosis of toenail   2. Callus   3. Porokeratosis   4. Deep vein thrombosis (DVT) of both lower extremities, unspecified chronicity, unspecified vein (HCC)   5. Bilateral calf pain   6. Phlebitis     Meds ordered this encounter  Medications   sulfamethoxazole-trimethoprim (BACTRIM DS) 800-160 MG tablet    Sig: Take 1 tablet by mouth 2 (two) times daily.    Dispense:  20 tablet    Refill:  0    -Patient was evaluated and treated. All patient's and/or POA's questions/concerns answered on today's visit. -Stat order sent for venous  doppler/ultrasound to rule out DVT b/l LE given patient's symptoms. Will call her with results. Patient notified if positive, she will need to report to ED. She related understanding. -Discussed correct way to wear compression hose.  -Toenails 2-5 bilaterally and right great toe debrided in length and girth without iatrogenic bleeding with sterile nail nipper and dremel.  -Callus(es) bilateral heels and bilateral great toes pared utilizing sterile scalpel blade without complication or incident. Total  number debrided =4. -Porokeratotic lesion(s) L 5th toe pared and enucleated with sterile currette without incident. Total number of lesions debrided=1. -Patient also evaluated by Dr. Boneta Lucks. Right ankle with concern for phlebitis, so Rx was sent for Bactrim DS for 10 days. Will have her follow up with Dr. Posey Pronto in 2 weeks. -Patient/POA to call should there be question/concern in the interim.   Return in about 3 months (around 11/21/2022).  Marzetta Board, DPM

## 2022-08-21 NOTE — Patient Instructions (Addendum)
Chronic Venous Insufficiency Chronic venous insufficiency is a condition where the leg veins cannot effectively pump blood from the legs to the heart. This happens when the vein walls are either stretched, weakened, or damaged, or when the valves inside the vein are damaged. With the right treatment, you should be able to continue with an active life. This condition is also called venous stasis. What are the causes? Common causes of this condition include: High blood pressure inside the veins (venous hypertension). Sitting or standing too long, causing increased blood pressure in the leg veins. A blood clot that blocks blood flow in a vein (deep vein thrombosis, DVT). Inflammation of a vein (phlebitis) that causes a blood clot to form. Tumors in the pelvis that cause blood to back up. What increases the risk? The following factors may make you more likely to develop this condition: Having a family history of this condition. Obesity. Pregnancy. Living without enough regular physical activity or exercise (sedentary lifestyle). Smoking. Having a job that requires long periods of standing or sitting in one place. Being a certain age. Women in their 21s and 52s and men in their 97s are more likely to develop this condition. What are the signs or symptoms? Symptoms of this condition include: Veins that are enlarged, bulging, or twisted (varicose veins). Skin breakdown or ulcers. Reddened skin or dark discoloration of skin on the leg between the knee and ankle. Brown, smooth, tight, and painful skin just above the ankle, usually on the inside of the leg (lipodermatosclerosis). Swelling of the legs. How is this diagnosed? This condition may be diagnosed based on: Your medical history. A physical exam. Tests, such as: A procedure that creates an image of a blood vessel and nearby organs and provides information about blood flow through the blood vessel (duplex ultrasound). A procedure that  tests blood flow (plethysmography). A procedure that looks at the veins using X-ray and dye (venogram). How is this treated? The goals of treatment are to help you return to an active life and to minimize pain or disability. Treatment depends on the severity of your condition, and it may include: Wearing compression stockings. These can help relieve symptoms and help prevent your condition from getting worse. However, they do not cure the condition. Sclerotherapy. This procedure involves an injection of a solution that shrinks damaged veins. Surgery. This may involve: Removing a diseased vein (vein stripping). Cutting off blood flow through the vein (laser ablation surgery). Repairing or reconstructing a valve within the affected vein. Follow these instructions at home:     Wear compression stockings as told by your health care provider. These stockings help to prevent blood clots and reduce swelling in your legs. Take over-the-counter and prescription medicines only as told by your health care provider. Stay active by exercising, walking, or doing different activities. Ask your health care provider what activities are safe for you and how much exercise you need. Drink enough fluid to keep your urine pale yellow. Do not use any products that contain nicotine or tobacco, such as cigarettes, e-cigarettes, and chewing tobacco. If you need help quitting, ask your health care provider. Keep all follow-up visits as told by your health care provider. This is important. Contact a health care provider if you: Have redness, swelling, or more pain in the affected area. See a red streak or line that goes up or down from the affected area. Have skin breakdown or skin loss in the affected area, even if the breakdown is small. Get  an injury in the affected area. Get help right away if: You get an injury and an open wound in the affected area. You have: Severe pain that does not get better with  medicine. Sudden numbness or weakness in the foot or ankle below the affected area. Trouble moving your foot or ankle. A fever. Worse or persistent symptoms. Chest pain. Shortness of breath. Summary Chronic venous insufficiency is a condition where the leg veins cannot effectively pump blood from the legs to the heart. Chronic venous insufficiency occurs when the vein walls become stretched, weakened, or damaged, or when valves within the vein are damaged. Treatment depends on how severe your condition is. It often involves wearing compression stockings and may involve having a procedure. Make sure you stay active by exercising, walking, or doing different activities. Ask your health care provider what activities are safe for you and how much exercise you need. This information is not intended to replace advice given to you by your health care provider. Make sure you discuss any questions you have with your health care provider. Document Revised: 12/17/2020 Document Reviewed: 12/17/2020 Elsevier Patient Education  [place].   Peripheral Edema  Peripheral edema is swelling that is caused by a buildup of fluid. Peripheral edema most often affects the lower legs, ankles, and feet. It can also develop in the arms, hands, and face. The area of the body that has peripheral edema will look swollen. It may also feel heavy or warm. Your clothes may start to feel tight. Pressing on the area may make a temporary dent in your skin (pitting edema). You may not be able to move your swollen arm or leg as much as usual. There are many causes of peripheral edema. It can happen because of a complication of other conditions such as heart failure, kidney disease, or a problem with your circulation. It also can be a side effect of certain medicines or happen because of an infection. It often happens to women during pregnancy. Sometimes, the cause is not known. Follow these instructions at home: Managing  pain, stiffness, and swelling  Raise (elevate) your legs while you are sitting or lying down. Move around often to prevent stiffness and to reduce swelling. Do not sit or stand for long periods of time. Do not wear tight clothing. Do not wear garters on your upper legs. Exercise your legs to get your circulation going. This helps to move the fluid back into your blood vessels, and it may help the swelling go down. Wear compression stockings as told by your health care provider. These stockings help to prevent blood clots and reduce swelling in your legs. It is important that these are the correct size. These stockings should be prescribed by your doctor to prevent possible injuries. If elastic bandages or wraps are recommended, use them as told by your health care provider. Medicines Take over-the-counter and prescription medicines only as told by your health care provider. Your health care provider may prescribe medicine to help your body get rid of excess water (diuretic). Take this medicine if you are told to take it. General instructions Eat a low-salt (low-sodium) diet as told by your health care provider. Sometimes, eating less salt may reduce swelling. Pay attention to any changes in your symptoms. Moisturize your skin daily to help prevent skin from cracking and draining. Keep all follow-up visits. This is important. Contact a health care provider if: You have a fever. You have swelling in only one leg. You  have increased swelling, redness, or pain in one or both of your legs. You have drainage or sores at the area where you have edema. Get help right away if: You have edema that starts suddenly or is getting worse, especially if you are pregnant or have a medical condition. You develop shortness of breath, especially when you are lying down. You have pain in your chest or abdomen. You feel weak. You feel like you will faint. These symptoms may be an emergency. Get help right  away. Call 911. Do not wait to see if the symptoms will go away. Do not drive yourself to the hospital. Summary Peripheral edema is swelling that is caused by a buildup of fluid. Peripheral edema most often affects the lower legs, ankles, and feet. Move around often to prevent stiffness and to reduce swelling. Do not sit or stand for long periods of time. Pay attention to any changes in your symptoms. Contact a health care provider if you have edema that starts suddenly or is getting worse, especially if you are pregnant or have a medical condition. Get help right away if you develop shortness of breath, especially when lying down. This information is not intended to replace advice given to you by your health care provider. Make sure you discuss any questions you have with your health care provider. Document Revised: 06/09/2021 Document Reviewed: 06/09/2021 Elsevier Patient Education  [place].

## 2022-08-23 NOTE — Progress Notes (Unsigned)
HPI  Female never smoker followed for sarcoid, chronic bronchitis complicated by history of right breast cancer/ XRT, tachycardia, GERD, anxiety, glaucoma, tremor.[name]  PCP Dr Danielle Peters Med Cardiac Study 11/25/16- EF 77%, hyperdynamic, otw  WNL Office Spirometry 12/31/2016-mild restriction of exhaled volume. FVC 1.61/77%, FEV1 1.37/86%, ratio 0.85, FEF 25-75% 2.00/ 144% -----------------------------------------------------------------------------------------.   02/24/22--  81 year old female never smoker followed for Sarcoid, Chronic Bronchitis, accompanied by history right breast CA/XRT, Tachycardia, GERD, anxiety, Glaucoma, Tremor -Breo 100, Spiriva Respimat, Flonase, Claritin,  Covid vax-3 Phizer Flu vax-had -----Patient would like to talk about inhaler and allergy medicine She has been using Claritin recently this spring but does not think it works well enough.  Had previously used Zyrtec and we discussed retrying that while she continues Flonase. May be some increasing chest tightness with some wheeze.  No obvious acute infection.  Baseline anxiety somewhat increased because she is pending corneal surgery. CXR 01/28/21-  FINDINGS: The heart size and mediastinal contours are within normal limits. No pneumothorax or pleural effusion is noted. Stable diffuse chronic densities are noted throughout both lungs, right greater than left, consistent with a history of sarcoidosis. No significant changes noted compared to prior exam. The visualized skeletal structures are unremarkable.  IMPRESSION: Stable bilateral chronic pulmonary densities as described above, right greater than left, consistent with sarcoidosis. No significant changes noted.  08/25/22-  81 year old female never smoker followed for Sarcoid, Chronic Bronchitis, accompanied by history right breast CA/XRT, Tachycardia, GERD, anxiety, Glaucoma, Tremor, HTN,  -Breo 100, Spiriva Respimat, Flonase, Claritin,  Covid vax-3 Phizer Flu vax-today  senior ------Pt states she is okay. Here with a caregiver today.  Danielle Peters is still having the same pains across her back and chest for which she was seen at ED in September.  That work-up was unrevealing.  Tylenol controls the pain.  There has been no progression or change in pattern.  It is aggravated by lifting trash can and is likely musculoskeletal/arthritic. After leaving here she will go for evaluation of leg veins assessing bilateral lower extremity edema.  She has follow-up appointment with cardiology next month. She denies change in her breathing.  Stable cough is dry. CXR 06/23/22-  IMPRESSION: RIGHT lung base scarring. No acute abnormalities.  ROS- see HPI  + = positive Constitutional:   No-   weight loss, night sweats, fevers, chills,+ fatigue, lassitude. HEENT:   No-  headaches, difficulty swallowing, tooth/dental problems, sore throat,       No-  sneezing, itching, ear ache, nasal congestion, +post nasal drip,  CV:   chest pain, orthopnea, PND, swelling in lower extremities, anasarca, dizziness, palpitations Resp: No-   shortness of breath with exertion or at rest.              productive cough,  + non-productive cough,  No-  coughing up of blood.              No-   change in color of mucus.  +wheezing.   Skin: +HPI GI:  No-   heartburn, indigestion, abdominal pain, nausea, vomiting,  GU:. MS:  No-   joint pain or swelling. + pain across mid thoracic back around to sides. 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 No depression or anxiety.  No memory loss.  Obj General- Alert, Oriented, Affect-appropriate, Distress- none acute,                     + Always slumped,  almost kyphotic posture Skin- + hyperpigmentation  Several isolated spots Lymphadenopathy- none Head- atraumatic            Eyes- Gross vision intact, PERRLA, conjunctivae clear secretions            Ears- Hearing, canals-normal            Nose- Clear, no-Septal  dev, mucus, polyps, erosion, perforation             Throat- Mallampati II , mucosa clear , drainage- none, tonsils- atrophic. + Dentures Neck- flexible , trachea midline, no stridor , thyroid nl, carotid no bruit Chest - symmetrical excursion , unlabored           Heart/CV- RRR with skipped beats , no murmur , no gallop  , no rub, nl s1 s2                - JVD- none , edema- none, stasis changes- none, varices- none           Lung- + inspiratory squeaks diffuse ,  Work of breathing is not increased.   cough- none , dullness-none, rub- none           Chest wall- +treatment Scar R breast, Abd- No HSM Br/ Gen/ Rectal- Not done, not indicated Extrem- cyanosis- none, clubbing, none, atrophy- none, strength- nl Neuro- + significant tremor hands

## 2022-08-25 ENCOUNTER — Ambulatory Visit (HOSPITAL_COMMUNITY)
Admission: RE | Admit: 2022-08-25 | Discharge: 2022-08-25 | Disposition: A | Discharge status: Home / Self Care | Payer: Medicare PPO | Source: Ambulatory Visit | Attending: Podiatry | Admitting: Podiatry

## 2022-08-25 ENCOUNTER — Ambulatory Visit: Payer: Medicare PPO | Admitting: Internal Medicine

## 2022-08-25 ENCOUNTER — Telehealth (INDEPENDENT_AMBULATORY_CARE_PROVIDER_SITE_OTHER): Payer: Medicare PPO | Admitting: Podiatry

## 2022-08-25 ENCOUNTER — Encounter: Payer: Self-pay | Admitting: Internal Medicine

## 2022-08-25 VITALS — BP 146/84 | HR 92 | Ht 63.25 in | Wt 140.8 lb

## 2022-08-25 DIAGNOSIS — R2243 Localized swelling, mass and lump, lower limb, bilateral: Secondary | ICD-10-CM

## 2022-08-25 DIAGNOSIS — J4489 Other specified chronic obstructive pulmonary disease: Secondary | ICD-10-CM

## 2022-08-25 DIAGNOSIS — D860 Sarcoidosis of lung: Secondary | ICD-10-CM | POA: Diagnosis not present

## 2022-08-25 DIAGNOSIS — J302 Other seasonal allergic rhinitis: Secondary | ICD-10-CM | POA: Diagnosis not present

## 2022-08-25 DIAGNOSIS — Z23 Encounter for immunization: Secondary | ICD-10-CM

## 2022-08-25 DIAGNOSIS — J3089 Other allergic rhinitis: Secondary | ICD-10-CM

## 2022-08-25 DIAGNOSIS — M79662 Pain in left lower leg: Secondary | ICD-10-CM | POA: Diagnosis not present

## 2022-08-25 DIAGNOSIS — I809 Phlebitis and thrombophlebitis of unspecified site: Secondary | ICD-10-CM | POA: Diagnosis not present

## 2022-08-25 DIAGNOSIS — M79661 Pain in right lower leg: Secondary | ICD-10-CM | POA: Insufficient documentation

## 2022-08-25 DIAGNOSIS — I82403 Acute embolism and thrombosis of unspecified deep veins of lower extremity, bilateral: Secondary | ICD-10-CM | POA: Diagnosis not present

## 2022-08-25 MED ORDER — FLUTICASONE PROPIONATE 50 MCG/ACT NA SUSP: NASAL | 11 refills | Status: DC

## 2022-08-25 NOTE — Assessment & Plan Note (Signed)
Refill Flonase. 

## 2022-08-25 NOTE — Telephone Encounter (Signed)
I called patient regarding her results of bilateral venous dopplers. Her testing was negative for blood clots in her lower extremities. I advised patient to continue antibiotics until all are gone for phlebitis of right ankle area. She stated her right ankle area felt better. Patient was instructed to follow up with Cardiology on 09/02/2022.

## 2022-08-25 NOTE — Assessment & Plan Note (Signed)
Residual scarring has resulted in chronic bronchitis. No longe active sarcoid inflammation. Plan- follow

## 2022-08-25 NOTE — Patient Instructions (Signed)
Order- flu vax senior  Flonase refilled

## 2022-08-25 NOTE — Assessment & Plan Note (Signed)
Stable chronic low-grade wheeze and dry cough reflecting scarring or distortion from old sarcoid and also from radiation therapy to her right breast area. Plan-continue inhalers.  She does not think she needs a nebulizer machine.

## 2022-08-26 NOTE — Telephone Encounter (Signed)
Noted, thanks!

## 2022-08-28 DIAGNOSIS — H8110 Benign paroxysmal vertigo, unspecified ear: Secondary | ICD-10-CM | POA: Diagnosis not present

## 2022-08-28 DIAGNOSIS — K219 Gastro-esophageal reflux disease without esophagitis: Secondary | ICD-10-CM | POA: Diagnosis not present

## 2022-09-02 ENCOUNTER — Ambulatory Visit: Payer: Medicare PPO | Admitting: Physician Assistant

## 2022-09-08 DIAGNOSIS — L810 Postinflammatory hyperpigmentation: Secondary | ICD-10-CM | POA: Diagnosis not present

## 2022-09-08 DIAGNOSIS — L853 Xerosis cutis: Secondary | ICD-10-CM | POA: Diagnosis not present

## 2022-09-08 DIAGNOSIS — L298 Other pruritus: Secondary | ICD-10-CM | POA: Diagnosis not present

## 2022-09-24 ENCOUNTER — Telehealth (INDEPENDENT_AMBULATORY_CARE_PROVIDER_SITE_OTHER): Payer: Medicare PPO | Admitting: Podiatry

## 2022-09-24 NOTE — Telephone Encounter (Signed)
Phoned [employer] and spoke to Clinical Nurse, Lavonne Chick regarding precert for CPT 16435.

## 2022-09-30 DIAGNOSIS — H4051X3 Glaucoma secondary to other eye disorders, right eye, severe stage: Secondary | ICD-10-CM | POA: Diagnosis not present

## 2022-09-30 DIAGNOSIS — Z947 Corneal transplant status: Secondary | ICD-10-CM | POA: Diagnosis not present

## 2022-09-30 DIAGNOSIS — Z961 Presence of intraocular lens: Secondary | ICD-10-CM | POA: Diagnosis not present

## 2022-10-02 DIAGNOSIS — E785 Hyperlipidemia, unspecified: Secondary | ICD-10-CM | POA: Diagnosis not present

## 2022-10-02 DIAGNOSIS — R5383 Other fatigue: Secondary | ICD-10-CM | POA: Diagnosis not present

## 2022-10-02 DIAGNOSIS — R7989 Other specified abnormal findings of blood chemistry: Secondary | ICD-10-CM | POA: Diagnosis not present

## 2022-10-02 DIAGNOSIS — I10 Essential (primary) hypertension: Secondary | ICD-10-CM | POA: Diagnosis not present

## 2022-10-22 NOTE — Progress Notes (Signed)
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 When seen last 02/11/22 she was orthostatic and HCTZ was discontinued.   Past Medical History    Past Medical History:  Diagnosis Date   Anemia    Anxiety    Breast cancer ([place]) 2004   right   Cancer [facility])    breast ca   Chest pain    echo 01/08/10- nl lv; mild aortic sclerosis; myoview 01/08/10- no ischemia   Dyslipidemia    myaligia with statins   Esophageal reflux    Hypertension    Neuropathy    Personal history of chemotherapy 2004   Personal history of radiation therapy 2004   Pulmonary sarcoidosis ([place])    Shingles 03/2016   Tremor    upper extremities   Ventricular tachycardia (paroxysmal) ([facility])    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; AV node reentry tachycardia ablation    Past Surgical History:  Procedure Laterality Date   Boil Right 03/2015   on buttock, excision   BREAST LUMPECTOMY Right 2004   CHOLECYSTECTOMY     left breast lumpectomy,chemo  2004   xrt   SKIN BIOPSY     TUBAL LIGATION     UPPER GI ENDOSCOPY  02/2015   difficulty swallowing, Abd pain    Allergies  Allergies  Allergen Reactions   Dextromethorphan Polistirex Er Other (See Comments)    SOB   Primidone     Severe Headaches   Albuterol     REACTION: tachycardia   Amoxicillin Diarrhea, Nausea Only and Other (See Comments)   Aspirin Other (See Comments)    Adult dose   Atorvastatin Other (See Comments)   Celecoxib Other (See Comments)   Clonazepam Hives and Other (See Comments)   Doxycycline Hyclate Other (See Comments)   Levaquin  [Levofloxacin] Other (See Comments)    Per patient, caused tendonitis.    Loratadine    Nortriptyline     Increase heart rate.   Propranolol Hcl Other (See Comments)    Dizziness, Fatgue   Simvastatin     Myalgias    Ezetimibe Rash    History of Present Illness    Mrs. Spainhower comes today for ongoing assessment and management of hypertension, hyperlipidemia, history of left bundle branch block.  She has multiple somatic complaints to include chronic fatigue, muscle aches and pains, intolerance to multiple medications, along with anxiety.  She has her caretaker with her who does confirm some of her issues.  She is on multiple medications for GERD, was taken off of Zetia due to rash.  She complains of lower extremity edema.  Home Medications    Current Outpatient Medications  Medication Sig Dispense Refill   meclizine (ANTIVERT) 25 MG tablet Take 1 tablet (25 mg total) by mouth 3 (three) times daily as needed for dizziness. 30 tablet 2   pantoprazole (PROTONIX) 40 MG tablet Take 40 mg by mouth 2 (two) times daily.     primidone (MYSOLINE) 50 MG tablet Take 50 mg by mouth 2 (two) times daily. Take 2 tablets by mouth 2 times daily.     valsartan (DIOVAN) 80 MG tablet Take 1 tablet (80 mg total) by  mouth daily. 90 tablet 3   ALPRAZolam (XANAX) 0.25 MG tablet  (Patient not taking: Reported on 10/23/2022)     aspirin 81 MG tablet Take 81 mg by mouth every other day. (Patient not taking: Reported on 10/23/2022)     betamethasone dipropionate (DIPROLENE) 0.05 % cream Apply 1 application. topically 2 (two) times daily. (Patient not taking: Reported on 10/23/2022)     diclofenac Sodium (VOLTAREN) 1 % GEL Apply to affected area 4x a day as directed Externally transderamlly for 30 days (Patient not taking: Reported on 10/23/2022)     fexofenadine (ALLEGRA) 60 MG tablet Take 60 mg by mouth 2 (two) times daily. (Patient not taking: Reported on 10/23/2022)     fluticasone (FLONASE) 50 MCG/ACT nasal spray SPRAY 2  SPRAYS INTO EACH NOSTRIL EVERY DAY (Patient not taking: Reported on 10/23/2022) 16 g 11   fluticasone furoate-vilanterol (BREO ELLIPTA) 100-25 MCG/ACT AEPB Inhale 1 puff into the lungs daily. Rinse mouth (Patient not taking: Reported on 10/23/2022) 60 each 12   Moxifloxacin HCl 0.5 % SOLN 1 drop into affected eye (Patient not taking: Reported on 10/23/2022)     ondansetron (ZOFRAN) 4 MG tablet Take 4 mg by mouth every 8 (eight) hours as needed for nausea or vomiting. (Patient not taking: Reported on 10/23/2022)     polyethylene glycol (MIRALAX / GLYCOLAX) 17 g packet Take 17 g by mouth daily as needed. (Patient not taking: Reported on 10/23/2022)     sodium chloride (MURO 128) 5 % ophthalmic solution 1 drop into affected eye (Patient not taking: Reported on 10/23/2022)     tiZANidine (ZANAFLEX) 2 MG tablet 1 tab TID as needed for muscle spasms **cash pay if not covered by insurance** (Patient not taking: Reported on 10/23/2022) 60 tablet 0   Current Facility-Administered Medications  Medication Dose Route Frequency Provider Last Rate Last Admin   albuterol (PROVENTIL) (2.5 MG/3ML) 0.083% nebulizer solution 2.5 mg  2.5 mg Nebulization Once Baird Lyons D, MD         Family History    Family History  Problem Relation Age of Onset   Heart failure Father 68   Cancer Mother    Heart attack Sister        2 heart attacks   Breast cancer Neg Hx    She indicated that her mother is deceased. She indicated that her father is deceased. She indicated that her sister is alive. She indicated that her brother is deceased. She indicated that the status of her neg hx is unknown.  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   Right Handed   1 cup caffeine daily   Social Determinants of Health   Financial Resource Strain: Not on file  Food Insecurity: Not on file  Transportation Needs: Not on file  Physical Activity: Not on file  Stress: Not on file  Social Connections: Not on file  Intimate Partner Violence: Not on file     Review of Systems    General:  No chills, fever, night sweats or weight changes.  Cardiovascular: Positive for mild chest pressure, no dyspnea on exertion, edema, orthopnea, palpitations, paroxysmal nocturnal dyspnea. Dermatological: No rash, lesions/masses Respiratory: No cough, dyspnea Urologic: No hematuria, dysuria Abdominal:   No nausea, vomiting, diarrhea, bright red blood per rectum, melena, or hematemesis Neurologic:  No visual changes, wkns, changes in mental status. All other systems reviewed and are otherwise negative except as noted above.     Physical Exam    VS:  BP (!) 150/84   Pulse 98   Ht '5\' 3"'$  (1.6 m)   Wt 145 lb (65.8 kg)   SpO2 95%   BMI 25.69 kg/m  , BMI Body mass index is 25.69 kg/m.     GEN: Well nourished, well developed, in no acute distress. HEENT: normal. Neck: Supple, no JVD, carotid bruits, or masses. Cardiac: RRR, no murmurs, rubs, or gallops. No clubbing, cyanosis, no significant edema, some varicosities seen..  Radials/DP/PT 2+ and equal bilaterally.  Respiratory:  Respirations regular and unlabored, coarse inspiratory lung sounds, to auscultation bilaterally.  Clear in the bases GI: Soft, nontender, nondistended, BS + x 4. MS: no deformity or atrophy. Skin: warm and dry, no rash. Neuro:  Strength and sensation are intact.  Essential tremor noted. Psych: Normal affect.  Accessory Clinical Findings    ECG  personally reviewed by me today-not completed this office visit- No acute changes  Lab Results  Component Value Date   WBC 6.6 06/23/2022   HGB 13.5 06/23/2022   HCT 41.5 06/23/2022   MCV 85.6 06/23/2022   PLT 201 06/23/2022   Lab Results  Component Value Date   CREATININE 0.86 06/23/2022   BUN 9 06/23/2022   NA 140 06/23/2022   K 3.5 06/23/2022   CL 105 06/23/2022   CO2 22 06/23/2022   Lab Results  Component Value Date   ALT 11 06/23/2022   AST 22 06/23/2022   ALKPHOS 98 06/23/2022   BILITOT 0.4 06/23/2022   No results found for: "CHOL", "HDL", "LDLCALC", "LDLDIRECT", "TRIG", "CHOLHDL"  No results found for: "HGBA1C"  Review of Prior Studies: NM Stress Test 12/27/2018 Nuclear stress EF: 82%. The left ventricular ejection fraction is hyperdynamic (>65%). There was no ST segment deviation noted during stress. This is a low risk study. No evidence of ischemia. There is no evidence of previous infarction The study is normal.  Assessment & Plan   1.  Hypertension: Will repeat echocardiogram for ongoing evaluation of LV function but has not been completed since 2011.  Blood pressure is well-controlled currently.  She is complaining of some chest pressure which may be related to GERD.  Nuclear medicine study was normal in 2020.  2.  Complaints of lower extremity edema: I am not seeing any significant lower extremity edema, however she does have some varicosities.  She is wearing compression hose which are too large for her.  I have advised her to be remeasured and wear hose first thing in the morning and take them off at night.  3.  Polypharmacy: I reviewed all of her medications and eliminated those which are duplicates.  She was on 2 PPIs and therefore I have taken her off of omeprazole 20 mg twice daily, and left her on pantoprazole 40 mg daily.  She is also taking Allegra 60 mg twice daily and I have changed  her to once a day.   4.  Hyperlipidemia: She is unable to tolerate  statins, in the setting of chronic myalgias.  She was also not able to tolerate Zetia due to rash.  She is advised on a low-cholesterol diet.  If she remains elevated on LDL and cholesterol may need to consider adding Repatha.  She is due to have labs by PCP in February 2024.  5.  Chronic fatigue: I will check a vitamin D level, magnesium level, and BMET today due to being on multiple medications.    Current medicines are reviewed at length with the patient today.  I have spent 30 min's  dedicated to the care of this patient on the date of this encounter to include pre-visit review of records, assessment, management and diagnostic testing,with shared decision making. Signed, Phill Myron. West Pugh, ANP, [place]   10/23/2022 12:32 PM      Office [phone] Fax [phone]  Notice: This dictation was prepared with Dragon dictation along with smaller phrase technology. Any transcriptional errors that result from this process are unintentional and may not be corrected upon review.

## 2022-10-23 ENCOUNTER — Ambulatory Visit: Payer: Medicare PPO | Attending: Physician Assistant | Admitting: Adult Health

## 2022-10-23 ENCOUNTER — Encounter: Payer: Self-pay | Admitting: Adult Health

## 2022-10-23 VITALS — BP 150/84 | HR 98 | Ht 63.0 in | Wt 145.0 lb

## 2022-10-23 DIAGNOSIS — I447 Left bundle-branch block, unspecified: Secondary | ICD-10-CM

## 2022-10-23 DIAGNOSIS — I10 Essential (primary) hypertension: Secondary | ICD-10-CM

## 2022-10-23 DIAGNOSIS — D860 Sarcoidosis of lung: Secondary | ICD-10-CM

## 2022-10-23 DIAGNOSIS — R5383 Other fatigue: Secondary | ICD-10-CM | POA: Diagnosis not present

## 2022-10-23 DIAGNOSIS — Z9189 Other specified personal risk factors, not elsewhere classified: Secondary | ICD-10-CM

## 2022-10-23 DIAGNOSIS — E7800 Pure hypercholesterolemia, unspecified: Secondary | ICD-10-CM

## 2022-10-23 LAB — BASIC METABOLIC PANEL
BUN/Creatinine Ratio: 10 — ABNORMAL LOW (ref 12–28)
BUN: 8 mg/dL (ref 8–27)
CO2: 24 mmol/L (ref 20–29)
Calcium: 9.8 mg/dL (ref 8.7–10.3)
Chloride: 103 mmol/L (ref 96–106)
Creatinine, Ser: 0.77 mg/dL (ref 0.57–1.00)
Glucose: 88 mg/dL (ref 70–99)
Potassium: 3.9 mmol/L (ref 3.5–5.2)
Sodium: 143 mmol/L (ref 134–144)
eGFR: 76 mL/min/{1.73_m2} (ref >59)

## 2022-10-23 LAB — MAGNESIUM: Magnesium: 2.1 mg/dL (ref 1.6–2.3)

## 2022-10-23 NOTE — Patient Instructions (Signed)
Medication Instructions:  Stop Zetia, Pepcid, Prilosec, Bactrim, and Kenalog  Cream. *If you need a refill on your cardiac medications before your next appointment, please call your pharmacy*   Lab Work: BMET, Magnesium, Vitamin D Level. Today If you have labs (blood work) drawn today and your tests are completely normal, you will receive your results only by: [place] (if you have MyChart) OR A paper copy in the mail If you have any lab test that is abnormal or we need to change your treatment, we will call you to review the results.   Testing/Procedures: [address] [place] has requested that you have an echocardiogram. Echocardiography is a painless test that uses sound waves to create images of your heart. It provides your doctor with information about the size and shape of your heart and how well your heart's chambers and valves are working. This procedure takes approximately one hour. There are no restrictions for this procedure. Please do NOT wear cologne, perfume, aftershave, or lotions (deodorant is allowed). Please arrive 15 minutes prior to your appointment time.    Follow-Up: At [facility], you and your health needs are our priority.  As part of our continuing mission to provide you with exceptional heart care, we have created designated Provider Care Teams.  These Care Teams include your primary Cardiologist (physician) and Advanced Practice Providers (APPs -  Physician Assistants and Nurse Practitioners) who all work together to provide you with the care you need, when you need it.  We recommend signing up for the patient portal called "MyChart".  Sign up information is provided on this After Visit Summary.  MyChart is used to connect with patients for Virtual Visits (Telemedicine).  Patients are able to view lab/test results, encounter notes, upcoming appointments, etc.  Non-urgent messages can be sent to your provider as well.    To learn more about what you can do with MyChart, go to [email or web address].    Your next appointment:   6 month(s)  The format for your next appointment:   In Person  Provider:   Sanda Klein, MD    Other Instructions Recommended fitted compression stockings.

## 2022-10-28 ENCOUNTER — Telehealth: Payer: Self-pay

## 2022-10-28 ENCOUNTER — Ambulatory Visit (HOSPITAL_COMMUNITY): Payer: Medicare PPO | Attending: Adult Health

## 2022-10-28 DIAGNOSIS — R011 Cardiac murmur, unspecified: Secondary | ICD-10-CM

## 2022-10-28 DIAGNOSIS — R5383 Other fatigue: Secondary | ICD-10-CM

## 2022-10-28 DIAGNOSIS — I10 Essential (primary) hypertension: Secondary | ICD-10-CM | POA: Diagnosis not present

## 2022-10-28 LAB — ECHOCARDIOGRAM COMPLETE
Area-P 1/2: 7.37 cm2
S' Lateral: 2.1 cm

## 2022-10-28 NOTE — Telephone Encounter (Addendum)
Called patient regarding results. Patient had understanding of results.----- Message from Lendon Colonel, NP sent at 10/28/2022 10:20 AM EST ----- I have reviewed the labs. No significant issues seen. Continue current medication regimen. Awaiting echo results and Vit D. Results.   KL

## 2022-10-28 NOTE — Telephone Encounter (Signed)
Spoke to patient advised I received a message you need refill for Valsartan/HCTZ.Stated she is presently taking plain Valsartan 80 mg daily.Stated when she had echo this morning her B/P elevated 190/90.She wanted to ask Dr.Croitoru if he will restart the Valsartan with HCTZ.She does not check B/P at home.Appointment scheduled with Jory Sims DNP 1/12 at 8:50 am.I will send message to Dr.Croitoru for advice.

## 2022-10-29 ENCOUNTER — Telehealth: Payer: Self-pay

## 2022-10-29 NOTE — Telephone Encounter (Signed)
Called patient left message on personal voice mail Dr.Croitoru advised to keep appointment with Jory Sims DNP tomorrow 1/12 at 8:50 am.

## 2022-10-29 NOTE — Progress Notes (Signed)
Cardiology Clinic Note   Patient Name: Danielle Peters Date of Encounter: 10/30/2022  Primary Care Provider:  Prince Solian, MD Primary Cardiologist:  Sanda Klein, MD  Patient Profile    82 year old female for ongoing assessment and management of hypertension, hyperlipidemia, history of left bundle branch block. She has multiple somatic complaints to include chronic fatigue, muscle aches and pains, intolerance to multiple medications, along with anxiety. She has her caretaker with her who does confirm some of her issues. She is on multiple medications for GERD, was taken off of Zetia due to rash. Last seen on 10/23/2022 for lower extremity edema. Echo was ordered.   Past Medical History    Past Medical History:  Diagnosis Date   Anemia    Anxiety    Breast cancer ([place]) 2004   right   Cancer [facility])    breast ca   Chest pain    echo 01/08/10- nl lv; mild aortic sclerosis; myoview 01/08/10- no ischemia   Dyslipidemia    myaligia with statins   Esophageal reflux    Hypertension    Neuropathy    Personal history of chemotherapy 2004   Personal history of radiation therapy 2004   Pulmonary sarcoidosis ([place])    Shingles 03/2016   Tremor    upper extremities   Ventricular tachycardia (paroxysmal) ([facility])    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; AV node reentry tachycardia ablation    Past Surgical History:  Procedure Laterality Date   Boil Right 03/2015   on buttock, excision   BREAST LUMPECTOMY Right 2004   CHOLECYSTECTOMY     left breast lumpectomy,[place]  2004   xrt   SKIN BIOPSY     TUBAL LIGATION     UPPER GI ENDOSCOPY  02/2015   difficulty swallowing, Abd pain    Allergies  Allergies  Allergen Reactions   Dextromethorphan Polistirex Er Other (See Comments)    SOB   Primidone     Severe Headaches   Albuterol     REACTION: tachycardia   Amoxicillin Diarrhea, Nausea Only and Other (See Comments)   Aspirin Other (See Comments)    Adult dose   Atorvastatin Other  (See Comments)   Celecoxib Other (See Comments)   Clonazepam Hives and Other (See Comments)   Doxycycline Hyclate Other (See Comments)   Levaquin [Levofloxacin] Other (See Comments)    Per patient, caused tendonitis.    Loratadine    Nortriptyline     Increase heart rate.   Propranolol Hcl Other (See Comments)    Dizziness, Fatgue   Simvastatin     Myalgias    Ezetimibe Rash    History of Present Illness    Danielle Peters comes today on follow up for HTN and to discuss echocardiogram results ordered on last office visit. Caretaker called on 10/29/2022 stating that BP was elevated during echocardiogram. She had been taken off of HCTZ due to orthostatic hypotension. Echo revealed normal LV function with Grade I diastolic dysfunction.    She is very anxious today about her blood pressure, and feeling dizzy.  She has not followed up with neurologist as they told her that she could continue to follow with her PCP.  Blood pressure has remained elevated.  She denies any chest pain or shortness of breath, but continues to have chronic dizziness.  Home Medications    Current Outpatient Medications  Medication Sig Dispense Refill   ALPRAZolam (XANAX) 0.25 MG tablet      aspirin 81 MG tablet  Take 81 mg by mouth every other day.     betamethasone dipropionate (DIPROLENE) 0.05 % cream Apply 1 application  topically 2 (two) times daily.     diclofenac Sodium (VOLTAREN) 1 % GEL      fexofenadine (ALLEGRA) 60 MG tablet Take 60 mg by mouth 2 (two) times daily.     fluticasone (FLONASE) 50 MCG/ACT nasal spray SPRAY 2 SPRAYS INTO EACH NOSTRIL EVERY DAY 16 g 11   fluticasone furoate-vilanterol (BREO ELLIPTA) 100-25 MCG/ACT AEPB Inhale 1 puff into the lungs daily. Rinse mouth 60 each 12   meclizine (ANTIVERT) 25 MG tablet Take 1 tablet (25 mg total) by mouth 3 (three) times daily as needed for dizziness. 30 tablet 2   Moxifloxacin HCl 0.5 % SOLN      ondansetron (ZOFRAN) 4 MG tablet Take 4 mg by mouth  every 8 (eight) hours as needed for nausea or vomiting.     pantoprazole (PROTONIX) 40 MG tablet Take 40 mg by mouth 2 (two) times daily.     polyethylene glycol (MIRALAX / GLYCOLAX) 17 g packet Take 17 g by mouth daily as needed.     primidone (MYSOLINE) 50 MG tablet Take 50 mg by mouth 2 (two) times daily. Take 2 tablets by mouth 2 times daily.     sodium chloride (MURO 128) 5 % ophthalmic solution      tiZANidine (ZANAFLEX) 2 MG tablet 1 tab TID as needed for muscle spasms **cash pay if not covered by insurance** 60 tablet 0   valsartan-hydrochlorothiazide (DIOVAN-HCT) 80-12.5 MG tablet Take 1 tablet by mouth daily. 30 tablet 3   Current Facility-Administered Medications  Medication Dose Route Frequency Provider Last Rate Last Admin   albuterol (PROVENTIL) (2.5 MG/3ML) 0.083% nebulizer solution 2.5 mg  2.5 mg Nebulization Once Danielle Peters D, MD         Family History    Family History  Problem Relation Age of Onset   Heart failure Father 56   Cancer Mother    Heart attack Sister        2 heart attacks   Breast cancer Neg Hx    She indicated that her mother is deceased. She indicated that her father is deceased. She indicated that her sister is alive. She indicated that her brother is deceased. She indicated that the status of her neg hx is unknown.  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   Right Handed   1 cup caffeine daily   Social Determinants of Health    Financial Resource Strain: Not on file  Food Insecurity: Not on file  Transportation Needs: Not on file  Physical Activity: Not on file  Stress: Not on file  Social Connections: Not on file  Intimate Partner Violence: Not on file     Review of Systems    General:  No chills,  fever, night sweats or weight changes.  Cardiovascular:  No chest pain, dyspnea on exertion, edema, orthopnea, palpitations, paroxysmal nocturnal dyspnea. Dermatological: No rash, lesions/masses Respiratory: No cough, dyspnea Urologic: No hematuria, dysuria Abdominal:   No nausea, vomiting, diarrhea, bright red blood per rectum, melena, or hematemesis Neurologic:  No visual changes, wkns, changes in mental status.  Continued intermittent dizziness and tremors All other systems reviewed and are otherwise negative except as noted above.     Physical Exam    VS:  BP (!) 168/84   Pulse 98   Ht '5\' 3"'$  (1.6 m)   Wt 140 lb (63.5 kg)   SpO2 95%   BMI 24.80 kg/m  , BMI Body mass index is 24.8 kg/m.     GEN: Well nourished, well developed, in no acute distress, anxious. HEENT: normal. Neck: Supple, no JVD, carotid bruits, or masses. Cardiac: RRR, no murmurs, rubs, or gallops. No clubbing, cyanosis, edema.  Wearing support socks, radials/DP/PT 2+ and equal bilaterally.  Respiratory:  Respirations regular and unlabored, clear to auscultation bilaterally. GI: Soft, nontender, nondistended, BS + x 4. MS: no deformity or atrophy. Skin: warm and dry, no rash. Neuro:  Strength and sensation are intact.  Essential tremor Psych: Normal affect.  Accessory Clinical Findings    ECG personally reviewed by me today-not completed this office visit.  Was just seen 1 week ago.  Lab Results  Component Value Date   WBC 6.6 06/23/2022   HGB 13.5 06/23/2022   HCT 41.5 06/23/2022   MCV 85.6 06/23/2022   PLT 201 06/23/2022   Lab Results  Component Value Date   CREATININE 0.77 10/23/2022   BUN 8 10/23/2022   NA 143  10/23/2022   K 3.9 10/23/2022   CL 103 10/23/2022   CO2 24 10/23/2022   Lab Results  Component Value Date   ALT 11 06/23/2022   AST 22 06/23/2022   ALKPHOS 98 06/23/2022   BILITOT 0.4 06/23/2022   No results found for: "CHOL", "HDL", "LDLCALC", "LDLDIRECT", "TRIG", "CHOLHDL"  No results found for: "HGBA1C"  Review of Prior Studies: Echocardiogram 08/26/2023 1. Left ventricular ejection fraction, by estimation, is 60 to 65%. The  left ventricle has normal function. The left ventricle has no regional  wall motion abnormalities. Left ventricular diastolic parameters are  consistent with Grade I diastolic  dysfunction (impaired relaxation).   2. Right ventricular systolic function is normal. The right ventricular  size is normal. There is mildly elevated pulmonary artery systolic  pressure. The estimated right ventricular systolic pressure is 13.0 mmHg.   3. The mitral valve is grossly normal. No evidence of mitral valve  regurgitation.   4. The aortic valve is tricuspid. Aortic valve regurgitation is not  visualized. Aortic valve sclerosis is present, with no evidence of aortic  valve stenosis.   5. The inferior vena cava is normal in size with greater than 50%  respiratory variability, suggesting right atrial pressure of 3 mmHg.    Assessment & Plan   1.  Uncontrolled hypertension: I did recheck her blood pressure in the exam room and found it to correlate with initial blood pressure on arrival.  Blood pressure was 164/68.  I will cautiously restart her on valsartan/HCTZ, 80/12.5 mg daily.  She will start this tomorrow as she is already taken her morning dose of 80 mg of valsartan.  She is advised on a low-sodium diet and given a salty 6 information sheet.  She has a caregiver with her and I have  asked her to take her blood pressures daily at the same time every day.  Will see her again in 1 month to evaluate her response to medication.  2.  Hyperlipidemia: Followed by PCP.   Continue low-cholesterol diet.  She is intolerant to statins and Zetia.  May need to be considered for PSK 9 inhibition on follow-up labs if her levels remain elevated.  Current medicines are reviewed at length with the patient today.  I have spent 25 min's  dedicated to the care of this patient on the date of this encounter to include pre-visit review of records, assessment, management and diagnostic testing,with shared decision making. Signed, Phill Myron. West Pugh, ANP, AACC   10/30/2022 10:30 AM      Office [phone] Fax [phone]  Notice: This dictation was prepared with Dragon dictation along with smaller phrase technology. Any transcriptional errors that result from this process are unintentional and may not be corrected upon review.

## 2022-10-29 NOTE — Telephone Encounter (Addendum)
Called patient regarding results. Patient had understanding of results.----- Message from Lendon Colonel, NP sent at 10/29/2022  1:57 PM EST ----- I have reviewed her echo results. Normal heart pumping function with some stiffening of the heart on relaxation, but not severe. No significant heart valve issues. Will see her on follow up.  KL

## 2022-10-30 ENCOUNTER — Ambulatory Visit: Payer: Medicare PPO | Attending: Adult Health | Admitting: Adult Health

## 2022-10-30 ENCOUNTER — Encounter: Payer: Self-pay | Admitting: Adult Health

## 2022-10-30 VITALS — BP 168/84 | HR 98 | Ht 63.0 in | Wt 140.0 lb

## 2022-10-30 DIAGNOSIS — F411 Generalized anxiety disorder: Secondary | ICD-10-CM

## 2022-10-30 DIAGNOSIS — I5032 Chronic diastolic (congestive) heart failure: Secondary | ICD-10-CM

## 2022-10-30 DIAGNOSIS — I10 Essential (primary) hypertension: Secondary | ICD-10-CM | POA: Diagnosis not present

## 2022-10-30 MED ORDER — VALSARTAN-HYDROCHLOROTHIAZIDE 80-12.5 MG PO TABS: 1.0000 | ORAL_TABLET | Freq: Every day | ORAL | 3 refills | Status: DC

## 2022-10-30 NOTE — Patient Instructions (Signed)
Medication Instructions:  Restart Valsartan/HCTZ 80/12.5 mg ( Take 1 Tablet Daily Starting on 10/31/2022). *If you need a refill on your cardiac medications before your next appointment, please call your pharmacy*   Lab Work: No Labs If you have labs (blood work) drawn today and your tests are completely normal, you will receive your results only by: [place] (if you have MyChart) OR A paper copy in the mail If you have any lab test that is abnormal or we need to change your treatment, we will call you to review the results.   Testing/Procedures: No Testing   Follow-Up: At [facility], you and your health needs are our priority.  As part of our continuing mission to provide you with exceptional heart care, we have created designated Provider Care Teams.  These Care Teams include your primary Cardiologist (physician) and Advanced Practice Providers (APPs -  Physician Assistants and Nurse Practitioners) who all work together to provide you with the care you need, when you need it.  We recommend signing up for the patient portal called "MyChart".  Sign up information is provided on this After Visit Summary.  MyChart is used to connect with patients for Virtual Visits (Telemedicine).  Patients are able to view lab/test results, encounter notes, upcoming appointments, etc.  Non-urgent messages can be sent to your provider as well.   To learn more about what you can do with MyChart, go to [email or web address].    Your next appointment:   1 month(s)  Provider:   Jory Sims, DNP, ANP     Other Instructions Monitor Blood Pressure Daily .

## 2022-11-03 ENCOUNTER — Telehealth: Payer: Self-pay | Admitting: Adult Health

## 2022-11-03 NOTE — Telephone Encounter (Signed)
Patient informed that she is to continue taking Diovan 80-12.5. Discussed fall precautions, avoiding Na+ and caffeine, and to stay hydrated. Reviewed with her on BP procedure. She will keep BP/P diary and call clinic on Friday with numbers and how she is feeling.

## 2022-11-03 NOTE — Telephone Encounter (Signed)
Pt c/o BP issue: STAT if pt c/o blurred vision, one-sided weakness or slurred speech  1. What are your last 5 BP readings?   Did not take medication this morning  This morning - 109/53    2. Are you having any other symptoms (ex. Dizziness, headache, blurred vision, passed out)? Lightheaded   3. What is your BP issue? Pt states she saw Curt Bears, NP last week and there were some med changes. She states her bp was low and wants to know what to do

## 2022-11-03 NOTE — Telephone Encounter (Addendum)
Patient stated that after 2 doses of her Diovan 80-12.5, she feels lightheaded. Her BP this AM was 109/53, P 89. While on phone she had her caregiver speak with me. Another BP was taken 135/65, P 103. Patient wanted to know if she can omit the HCTZ in her medication because of the lightheadedness she feels.

## 2022-11-11 DIAGNOSIS — H4051X3 Glaucoma secondary to other eye disorders, right eye, severe stage: Secondary | ICD-10-CM | POA: Diagnosis not present

## 2022-11-27 DIAGNOSIS — I10 Essential (primary) hypertension: Secondary | ICD-10-CM | POA: Diagnosis not present

## 2022-11-27 DIAGNOSIS — K219 Gastro-esophageal reflux disease without esophagitis: Secondary | ICD-10-CM | POA: Diagnosis not present

## 2022-11-27 DIAGNOSIS — Z0000 Encounter for general adult medical examination without abnormal findings: Secondary | ICD-10-CM | POA: Diagnosis not present

## 2022-11-27 DIAGNOSIS — I5189 Other ill-defined heart diseases: Secondary | ICD-10-CM | POA: Diagnosis not present

## 2022-11-27 DIAGNOSIS — C50919 Malignant neoplasm of unspecified site of unspecified female breast: Secondary | ICD-10-CM | POA: Diagnosis not present

## 2022-11-27 DIAGNOSIS — E785 Hyperlipidemia, unspecified: Secondary | ICD-10-CM | POA: Diagnosis not present

## 2022-11-27 DIAGNOSIS — I119 Hypertensive heart disease without heart failure: Secondary | ICD-10-CM | POA: Diagnosis not present

## 2022-11-27 DIAGNOSIS — I447 Left bundle-branch block, unspecified: Secondary | ICD-10-CM | POA: Diagnosis not present

## 2022-11-27 DIAGNOSIS — J449 Chronic obstructive pulmonary disease, unspecified: Secondary | ICD-10-CM | POA: Diagnosis not present

## 2022-11-27 DIAGNOSIS — I471 Supraventricular tachycardia, unspecified: Secondary | ICD-10-CM | POA: Diagnosis not present

## 2022-11-27 DIAGNOSIS — R82998 Other abnormal findings in urine: Secondary | ICD-10-CM | POA: Diagnosis not present

## 2022-12-02 NOTE — Progress Notes (Signed)
Cardiology Clinic Note   Patient Name: Danielle Peters Date of Encounter: 12/04/2022  Primary Care Provider:  Prince Solian, MD Primary Cardiologist:  Danielle Klein, MD  Patient Profile    82 year old female for ongoing assessment and management of hypertension, hyperlipidemia, history of left bundle branch block. She has multiple somatic complaints to include chronic fatigue, muscle aches and pains, intolerance to multiple medications, along with anxiety. She has her caretaker with her who does confirm some of her issues. She is on multiple medications for GERD, was taken off of Zetia due to rash. Last seen on 10/23/2022 for lower extremity edema. Echo  normal LV function with Grade I diastolic dysfunction.     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; AV node reentry tachycardia ablation    Past Surgical History:  Procedure Laterality Date   Boil Right 03/2015   on buttock, excision   BREAST LUMPECTOMY Right 2004   CHOLECYSTECTOMY     left breast lumpectomy,[place]  2004   xrt   SKIN BIOPSY     TUBAL LIGATION     UPPER GI ENDOSCOPY  02/2015   difficulty swallowing, Abd pain    Allergies  Allergies  Allergen Reactions   Dextromethorphan Polistirex Er Other (See Comments)    SOB   Primidone     Severe Headaches   Albuterol     REACTION: tachycardia   Amoxicillin Diarrhea, Nausea Only and Other (See Comments)   Aspirin Other (See  Comments)    Adult dose   Atorvastatin Other (See Comments)   Celecoxib Other (See Comments)   Clonazepam Hives and Other (See Comments)   Doxycycline Hyclate Other (See Comments)   Levaquin [Levofloxacin] Other (See Comments)    Per patient, caused tendonitis.    Loratadine    Nortriptyline     Increase heart rate.   Propranolol Hcl Other (See Comments)    Dizziness, Fatgue   Simvastatin     Myalgias    Ezetimibe Rash    History of Present Illness    [name] comes today for ongoing assessment and management of HTN.  She has significant anxiety about her BP, with labile readings, some hypotension causing dizziness .  On last office visit she remained hypertensive BP 164/68. I cautiously restarted her valsartan 80/12.5 mg daily. She is here for follow up.  She is very nervous and confused about her medications.  Her caregiver is not with her in the room.  Blood pressure has been up and down at home.  She has recently been seen in by her PCP and labs have been drawn.  Total cholesterol was 270, LDL 174, triglycerides 75.  She was not found to be anemic, TSH was 1.43.  She brings with her 2 bags of medication some which she is taking  some which she is not taking in some which she does not know.  She denies chest pain, shortness of breath, dizziness,.  She continues tremors, which are exacerbated by stress.  Home Medications    Current Outpatient Medications  Medication Sig Dispense Refill   ALPRAZolam (XANAX) 0.25 MG tablet      aspirin 81 MG tablet Take 81 mg by mouth every other day.     betamethasone dipropionate (DIPROLENE) 0.05 % cream Apply 1 application  topically 2 (two) times daily.     brimonidine (ALPHAGAN) 0.2 % ophthalmic solution Ophthalmic for 37 Days     diclofenac Sodium (VOLTAREN) 1 % GEL      famotidine (PEPCID) 20 MG tablet 1 tablet at bedtime as needed Orally Once a day at bedtime for acid reflux for 90 days     fluticasone (FLONASE) 50 MCG/ACT nasal spray  SPRAY 2 SPRAYS INTO EACH NOSTRIL EVERY DAY 16 g 11   fluticasone furoate-vilanterol (BREO ELLIPTA) 100-25 MCG/ACT AEPB Inhale 1 puff into the lungs daily. Rinse mouth 60 each 12   loratadine (CLARITIN) 10 MG tablet Take 10 mg by mouth daily.     meclizine (ANTIVERT) 25 MG tablet Take 1 tablet (25 mg total) by mouth 3 (three) times daily as needed for dizziness. 30 tablet 2   Moxifloxacin HCl 0.5 % SOLN      ondansetron (ZOFRAN) 4 MG tablet Take 4 mg by mouth every 8 (eight) hours as needed for nausea or vomiting.     pantoprazole (PROTONIX) 40 MG tablet Take 40 mg by mouth 2 (two) times daily.     polyethylene glycol (MIRALAX / GLYCOLAX) 17 g packet Take 17 g by mouth daily as needed.     primidone (MYSOLINE) 50 MG tablet Take 50 mg by mouth 2 (two) times daily. Take 2 tablets by mouth 2 times daily.     sodium chloride (MURO 128) 5 % ophthalmic solution      tiZANidine (ZANAFLEX) 2 MG tablet 1 tab TID as needed for muscle spasms **cash pay if not covered by insurance** 60 tablet 0   valsartan-hydrochlorothiazide (DIOVAN-HCT) 80-12.5 MG tablet Take 1 tablet by mouth daily.     fexofenadine (ALLEGRA) 60 MG tablet Take 60 mg by mouth 2 (two) times daily. (Patient not taking: Reported on 12/04/2022)     Current Facility-Administered Medications  Medication Dose Route Frequency Provider Last Rate Last Admin   albuterol (PROVENTIL) (2.5 MG/3ML) 0.083% nebulizer solution 2.5 mg  2.5 mg Nebulization Once Baird Lyons D, MD         Family History    Family History  Problem Relation Age of Onset   Heart failure Father 31   Cancer Mother    Heart attack Sister        2 heart attacks   Breast cancer Neg Hx    She indicated that her mother is deceased. She indicated that her father is deceased. She indicated that her sister is alive. She indicated that her brother is deceased. She indicated that the status of her neg hx is unknown.  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   Right Handed   1 cup caffeine daily   Social Determinants of Health   Financial Resource Strain: Not on file  Food Insecurity: Not on file  Transportation Needs: Not on file  Physical Activity: Not on file  Stress: Not on file  Social Connections: Not on file  Intimate Partner Violence: Not on file     Review of Systems    General:  No chills, fever, night sweats or weight changes.  Cardiovascular:  No chest pain, dyspnea on exertion, edema, orthopnea, palpitations, paroxysmal nocturnal dyspnea. Dermatological: No rash, lesions/masses Respiratory: No cough, dyspnea Urologic: No hematuria, dysuria Abdominal:   No nausea, vomiting, diarrhea, bright red blood per rectum, melena, or hematemesis Neurologic:  No visual changes, wkns, changes in mental status. All other systems reviewed and are otherwise negative except as noted above.     Physical Exam    VS:  BP (!) 169/75   Pulse 88   Ht 5' 3.25" (1.607 m)   Wt 137 lb 12.8 oz (62.5 kg)   SpO2 98%   BMI 24.22 kg/m  , BMI Body mass index is 24.22 kg/m.     GEN: Well nourished, well developed, in no acute distress. HEENT: normal. Neck: Supple, no JVD, carotid bruits, or masses. Cardiac: RRR, no murmurs, rubs, or gallops. No clubbing, cyanosis, edema.  Radials/DP/PT 2+ and equal bilaterally.  Bilateral inspiratory wheezes, in the upper lobes, otherwise, clear to auscultation bilaterally. GI: Soft, nontender, nondistended, BS + x  4. MS: no deformity or atrophy. Skin: warm and dry, no rash. Neuro:  Strength and sensation are intact.  Essential tremor worsening with anxiety. Psych: Normal affect.  Accessory Clinical Findings    ECG personally reviewed by me today-not completed this office visit  Lab Results  Component Value Date   WBC 6.6 06/23/2022   HGB 13.5 06/23/2022   HCT 41.5 06/23/2022   MCV 85.6 06/23/2022   PLT 201 06/23/2022   Lab Results  Component Value Date   CREATININE 0.77 10/23/2022   BUN 8 10/23/2022   NA 143 10/23/2022   K 3.9 10/23/2022   CL 103 10/23/2022   CO2 24 10/23/2022   Lab Results  Component Value Date   ALT 11 06/23/2022   AST 22 06/23/2022   ALKPHOS 98 06/23/2022   BILITOT 0.4 06/23/2022   No results found for: "CHOL", "HDL", "LDLCALC", "LDLDIRECT", "TRIG", "CHOLHDL"  No results found for: "HGBA1C"  Review of Prior Studies: Echocardiogram 08/26/2023 1. Left ventricular ejection fraction, by estimation, is 60 to 65%. The  left ventricle has normal function. The left ventricle has no regional  wall motion abnormalities. Left ventricular diastolic parameters are  consistent with Grade I diastolic  dysfunction (impaired relaxation).   2. Right ventricular systolic function is normal. The right ventricular  size is normal. There is mildly elevated pulmonary artery systolic  pressure. The estimated right ventricular systolic pressure is 123456 mmHg.   3. The mitral valve is grossly normal. No evidence of mitral valve  regurgitation.   4. The aortic valve is tricuspid. Aortic valve regurgitation is not  visualized. Aortic valve sclerosis is present, with no evidence of aortic  valve stenosis.   5. The inferior vena cava is  normal in size with greater than 50%  respiratory variability, suggesting right atrial pressure of 3 mmHg.   Assessment & Plan   1.  Difficult to control hypertension: The patient has polypharmacy and is very confused about what medication she  should be taking, there are multiple duplicates and bottles which she brings with her.  I have spent a good bit of time going over each one of the medications, explaining to her which she is taking, and removing those which she is not taking any longer and separating them.  She would like to be placed back on losartan 80/12.5 as she feels her blood pressure was much better controlled on this.  She has had dizziness in the past on the HCTZ portion and this had been removed.  She states that she feels dizzy a lot without the the HCTZ.  She thinks it is from some of the medications that she is taking although she is not sure which.  I have given her bag with the medication she is not taking and have advised her to go to a local pharmacy where she can dispose of them safely.  Will see her again in 6 months on follow-up unless she has more complaints and wishes to be seen sooner.  2.  Polypharmacy: I have separated out her old medications and medications that she is not taking in order to have clarity for her.  I have gone over each of her medications so that she is aware of what they are for.  She does have bottles of valsartan HCTZ 80/12.5 already and she can restart that.  This has been changed in EMR.  3.  Chronic dizziness: I have asked her to stay hydrated and take the meclizine as needed.  This should be followed by her PCP.    Current medicines are reviewed at length with the patient today.  I have spent 30 min's  dedicated to the care of this patient on the date of this encounter to include pre-visit review of records, assessment, management and diagnostic testing,with shared decision making. Signed, Phill Myron. West Pugh, ANP, AACC   12/04/2022 12:51 PM      Office [phone] Fax [phone]  Notice: This dictation was prepared with Dragon dictation along with smaller phrase technology. Any transcriptional errors that result from this process are unintentional and may not be corrected  upon review.

## 2022-12-04 ENCOUNTER — Encounter: Payer: Self-pay | Admitting: Adult Health

## 2022-12-04 ENCOUNTER — Ambulatory Visit: Payer: Medicare PPO | Attending: Adult Health | Admitting: Adult Health

## 2022-12-04 VITALS — BP 169/75 | HR 88 | Ht 63.25 in | Wt 137.8 lb

## 2022-12-04 DIAGNOSIS — I10 Essential (primary) hypertension: Secondary | ICD-10-CM | POA: Diagnosis not present

## 2022-12-04 DIAGNOSIS — R42 Dizziness and giddiness: Secondary | ICD-10-CM

## 2022-12-04 DIAGNOSIS — D860 Sarcoidosis of lung: Secondary | ICD-10-CM | POA: Diagnosis not present

## 2022-12-04 DIAGNOSIS — I447 Left bundle-branch block, unspecified: Secondary | ICD-10-CM

## 2022-12-04 DIAGNOSIS — F411 Generalized anxiety disorder: Secondary | ICD-10-CM | POA: Diagnosis not present

## 2022-12-04 DIAGNOSIS — Z79899 Other long term (current) drug therapy: Secondary | ICD-10-CM

## 2022-12-04 NOTE — Patient Instructions (Signed)
Medication Instructions:  No Changes *If you need a refill on your cardiac medications before your next appointment, please call your pharmacy*   Lab Work: No labs If you have labs (blood work) drawn today and your tests are completely normal, you will receive your results only by: [place] (if you have MyChart) OR A paper copy in the mail If you have any lab test that is abnormal or we need to change your treatment, we will call you to review the results.   Testing/Procedures: No Testing   Follow-Up: At [facility], you and your health needs are our priority.  As part of our continuing mission to provide you with exceptional heart care, we have created designated Provider Care Teams.  These Care Teams include your primary Cardiologist (physician) and Advanced Practice Providers (APPs -  Physician Assistants and Nurse Practitioners) who all work together to provide you with the care you need, when you need it.  We recommend signing up for the patient portal called "MyChart".  Sign up information is provided on this After Visit Summary.  MyChart is used to connect with patients for Virtual Visits (Telemedicine).  Patients are able to view lab/test results, encounter notes, upcoming appointments, etc.  Non-urgent messages can be sent to your provider as well.   To learn more about what you can do with MyChart, go to [email or web address].    Your next appointment:   6 month(s)  Provider:   Sanda Klein, MD

## 2022-12-07 DIAGNOSIS — H4051X3 Glaucoma secondary to other eye disorders, right eye, severe stage: Secondary | ICD-10-CM | POA: Diagnosis not present

## 2022-12-16 ENCOUNTER — Ambulatory Visit: Payer: Medicare PPO | Admitting: Podiatry

## 2022-12-16 ENCOUNTER — Encounter: Payer: Self-pay | Admitting: Podiatry

## 2022-12-16 VITALS — BP 159/63

## 2022-12-16 DIAGNOSIS — Q828 Other specified congenital malformations of skin: Secondary | ICD-10-CM | POA: Diagnosis not present

## 2022-12-16 DIAGNOSIS — M792 Neuralgia and neuritis, unspecified: Secondary | ICD-10-CM | POA: Diagnosis not present

## 2022-12-16 DIAGNOSIS — M79676 Pain in unspecified toe(s): Secondary | ICD-10-CM

## 2022-12-16 DIAGNOSIS — B351 Tinea unguium: Secondary | ICD-10-CM

## 2022-12-16 DIAGNOSIS — L84 Corns and callosities: Secondary | ICD-10-CM

## 2022-12-16 NOTE — Patient Instructions (Addendum)
Purchase sneakers from [employer]. Sneakers should have mesh or stretchable uppers.  [employer] [place] [address], [place] [phone]  Purchase Nervive Pain Cream or Roll On. Apply to feet before bedtime. Can be purchased at your local drug store over the counter.  Peripheral Neuropathy Peripheral neuropathy is a type of nerve damage. It affects nerves that carry signals between the spinal cord and the arms, legs, and the rest of the body (peripheral nerves). It does not affect nerves in the spinal cord or brain. In peripheral neuropathy, one nerve or a group of nerves may be damaged. Peripheral neuropathy is a broad category that includes many specific nerve disorders, like diabetic neuropathy, hereditary neuropathy, and carpal tunnel syndrome. What are the causes? This condition may be caused by: Certain diseases, such as: Diabetes. This is the most common cause of peripheral neuropathy. Autoimmune diseases, such as rheumatoid arthritis and systemic lupus erythematosus. Nerve diseases that are passed from parent to child (inherited). Kidney disease. Thyroid disease. Other causes may include: Nerve injury. Pressure or stress on a nerve that lasts a long time. Lack (deficiency) of B vitamins. This can result from alcoholism, poor diet, or a restricted diet. Infections. Some medicines, such as cancer medicines (chemotherapy). Poisonous (toxic) substances, such as lead and mercury. Too little blood flowing to the legs. In some cases, the cause of this condition is not known. What are the signs or symptoms? Symptoms of this condition depend on which of your nerves is damaged. Symptoms in the legs, hands, and arms can include: Loss of feeling (numbness) in the feet, hands, or both. Tingling in the feet, hands, or both. Burning pain. Very sensitive skin. Weakness. Not being able to move a part of the body (paralysis). Clumsiness or poor coordination. Muscle  twitching. Loss of balance. Symptoms in other parts of the body can include: Not being able to control your bladder. Feeling dizzy. Sexual problems. How is this diagnosed? Diagnosing and finding the cause of peripheral neuropathy can be difficult. Your health care provider will take your medical history and do a physical exam. A neurological exam will also be done. This involves checking things that are affected by your brain, spinal cord, and nerves (nervous system). For example, your health care provider will check your reflexes, how you move, and what you can feel. You may have other tests, such as: Blood tests. Electromyogram (EMG) and nerve conduction tests. These tests check nerve function and how well the nerves are controlling the muscles. Imaging tests, such as a CT scan or MRI, to rule out other causes of your symptoms. Removing a small piece of nerve to be examined in a lab (nerve biopsy). Removing and examining a small amount of the fluid that surrounds the brain and spinal cord (lumbar puncture). How is this treated? Treatment for this condition may involve: Treating the underlying cause of the neuropathy, such as diabetes, kidney disease, or vitamin deficiencies. Stopping medicines that can cause neuropathy, such as chemotherapy. Medicine to help relieve pain. Medicines may include: Prescription or over-the-counter pain medicine. Anti-seizure medicine. Antidepressants. Pain-relieving patches that are applied to painful areas of skin. Surgery to relieve pressure on a nerve or to destroy a nerve that is causing pain. Physical therapy to help improve movement and balance. Devices to help you move around (assistive devices). Follow these instructions at home: Medicines Take over-the-counter and prescription medicines only as told by your health care provider. Do not take any other medicines without first asking your  health care provider. Ask your health care provider if the  medicine prescribed to you requires you to avoid driving or using machinery. Lifestyle  Do not use any products that contain nicotine or tobacco. These products include cigarettes, chewing tobacco, and vaping devices, such as e-cigarettes. Smoking keeps blood from reaching damaged nerves. If you need help quitting, ask your health care provider. Avoid or limit alcohol. Too much alcohol can cause a vitamin B deficiency, and vitamin B is needed for healthy nerves. Eat a healthy diet. This includes: Eating foods that are high in fiber, such as beans, whole grains, and fresh fruits and vegetables. Limiting foods that are high in fat and processed sugars, such as fried or sweet foods. General instructions  If you have diabetes, work closely with your health care provider to keep your blood sugar under control. If you have numbness in your feet: Check every day for signs of injury or infection. Watch for redness, warmth, and swelling. Wear padded socks and comfortable shoes. These help protect your feet. Develop a good support system. Living with peripheral neuropathy can be stressful. Consider talking with a mental health specialist or joining a support group. Use assistive devices and attend physical therapy as told by your health care provider. This may include using a walker or a cane. Keep all follow-up visits. This is important. Where to find more information [employer] of Neurological Disorders: MasterBoxes.it Contact a health care provider if: You have new signs or symptoms of peripheral neuropathy. You are struggling emotionally from dealing with peripheral neuropathy. Your pain is not well controlled. Get help right away if: You have an injury or infection that is not healing normally. You develop new weakness in an arm or leg. You have fallen or do so frequently. Summary Peripheral neuropathy is when the nerves in the arms or legs are damaged, resulting in numbness,  weakness, or pain. There are many causes of peripheral neuropathy, including diabetes, pinched nerves, vitamin deficiencies, autoimmune disease, and hereditary conditions. Diagnosing and finding the cause of peripheral neuropathy can be difficult. Your health care provider will take your medical history, do a physical exam, and do tests, including blood tests and nerve function tests. Treatment involves treating the underlying cause of the neuropathy and taking medicines to help control pain. Physical therapy and assistive devices may also help. This information is not intended to replace advice given to you by your health care provider. Make sure you discuss any questions you have with your health care provider. Document Revised: 06/10/2021 Document Reviewed: 06/10/2021 Elsevier Patient Education  [place].

## 2022-12-16 NOTE — Progress Notes (Unsigned)
  Subjective:  Patient ID: Danielle Peters, female    DOB: 1941/02/27,  MRN: [id number]  [place] presents to clinic today for {jgcomplaint:23593}  Chief Complaint  Patient presents with   Nail Problem    [facility] PCP-Avva PCP VST- "Last week"   New problem(s): None. {jgcomplaint:23593}  PCP is Avva, Ravisankar, MD.  Allergies  Allergen Reactions   Dextromethorphan Polistirex Er Other (See Comments)    SOB   Primidone     Severe Headaches   Albuterol     REACTION: tachycardia   Amoxicillin Diarrhea, Nausea Only and Other (See Comments)   Aspirin Other (See Comments)    Adult dose   Atorvastatin Other (See Comments)   Celecoxib Other (See Comments)   Clonazepam Hives and Other (See Comments)   Doxycycline Hyclate Other (See Comments)   Levaquin [Levofloxacin] Other (See Comments)    Per patient, caused tendonitis.    Loratadine    Nortriptyline     Increase heart rate.   Propranolol Hcl Other (See Comments)    Dizziness, Fatgue   Simvastatin     Myalgias    Ezetimibe Rash    Review of Systems: Negative except as noted in the HPI.  Objective: No changes noted in today's physical examination. Vitals:   12/16/22 0949  BP: (!) 159/63   [facility] is a pleasant 82 y.o. female {jgbodyhabitus:24098} AAO x 3.  Vascular CFT <3 seconds b/l LE. Palpable DP/PT pulses b/l LE. Digital hair absent b/l. Skin temperature gradient WNL b/l. No pain with calf compression b/l. No edema noted b/l. No cyanosis or clubbing noted b/l LE.  Neurologic Normal speech. Oriented to person, place, and time. Protective sensation intact 5/5 intact bilaterally with 10g monofilament b/l. Vibratory sensation intact b/l.  Dermatologic Pedal skin is warm and supple b/l LE. No open wounds b/l LE. No interdigital macerations noted b/l LE.   Toenails 2-5 bilaterally and R hallux elongated, discolored, dystrophic, thickened, and crumbly with subungual debris and tenderness  to dorsal palpation. Anonychia noted L hallux. Nailbed(s) epithelialized.    Hyperkeratotic lesion(s) bilateral heels and bilateral great toes. No erythema, no edema, no drainage, no fluctuance. Porokeratotic lesion(s) L 5th toe. No erythema, no edema, no drainage, no fluctuance.  Orthopedic: Muscle strength 5/5 to all lower extremity muscle groups bilaterally. Limited joint ROM to the bilateral ankles. Hammertoe(s) noted to the bilateral 5th toes.   Radiographs: None  Assessment/Plan: No diagnosis found.  No orders of the defined types were placed in this encounter.   None {Jgplan:23602::"-Patient/POA to call should there be question/concern in the interim."}   Return in about 3 months (around 03/16/2023).  Marzetta Board, DPM

## 2023-01-26 ENCOUNTER — Other Ambulatory Visit: Payer: Self-pay | Admitting: Adult Health

## 2023-01-27 DIAGNOSIS — H4051X3 Glaucoma secondary to other eye disorders, right eye, severe stage: Secondary | ICD-10-CM | POA: Diagnosis not present

## 2023-01-27 DIAGNOSIS — Z947 Corneal transplant status: Secondary | ICD-10-CM | POA: Diagnosis not present

## 2023-01-27 DIAGNOSIS — H26492 Other secondary cataract, left eye: Secondary | ICD-10-CM | POA: Diagnosis not present

## 2023-01-27 DIAGNOSIS — Z961 Presence of intraocular lens: Secondary | ICD-10-CM | POA: Diagnosis not present

## 2023-01-29 DIAGNOSIS — H26492 Other secondary cataract, left eye: Secondary | ICD-10-CM | POA: Diagnosis not present

## 2023-02-07 ENCOUNTER — Other Ambulatory Visit: Payer: Self-pay | Admitting: Cardiovascular Disease

## 2023-02-10 DIAGNOSIS — H4051X3 Glaucoma secondary to other eye disorders, right eye, severe stage: Secondary | ICD-10-CM | POA: Diagnosis not present

## 2023-02-16 DIAGNOSIS — L720 Epidermal cyst: Secondary | ICD-10-CM | POA: Diagnosis not present

## 2023-02-16 DIAGNOSIS — L218 Other seborrheic dermatitis: Secondary | ICD-10-CM | POA: Diagnosis not present

## 2023-02-16 DIAGNOSIS — L821 Other seborrheic keratosis: Secondary | ICD-10-CM | POA: Diagnosis not present

## 2023-02-16 DIAGNOSIS — L810 Postinflammatory hyperpigmentation: Secondary | ICD-10-CM | POA: Diagnosis not present

## 2023-02-16 DIAGNOSIS — L853 Xerosis cutis: Secondary | ICD-10-CM | POA: Diagnosis not present

## 2023-02-16 DIAGNOSIS — L298 Other pruritus: Secondary | ICD-10-CM | POA: Diagnosis not present

## 2023-04-13 ENCOUNTER — Encounter: Payer: Self-pay | Admitting: Podiatry

## 2023-04-13 ENCOUNTER — Ambulatory Visit: Payer: Medicare PPO | Admitting: Podiatry

## 2023-04-13 VITALS — BP 155/62 | HR 100

## 2023-04-13 DIAGNOSIS — M79676 Pain in unspecified toe(s): Secondary | ICD-10-CM | POA: Diagnosis not present

## 2023-04-13 DIAGNOSIS — G609 Hereditary and idiopathic neuropathy, unspecified: Secondary | ICD-10-CM

## 2023-04-13 DIAGNOSIS — B351 Tinea unguium: Secondary | ICD-10-CM

## 2023-04-13 DIAGNOSIS — Q828 Other specified congenital malformations of skin: Secondary | ICD-10-CM

## 2023-04-18 ENCOUNTER — Encounter: Payer: Self-pay | Admitting: Podiatry

## 2023-04-18 NOTE — Progress Notes (Signed)
Subjective:  Patient ID: Danielle Peters, female    DOB: [birth year],  MRN: [id number]  Ladislava Pregler Pogorzelski presents to clinic today for at risk foot care with history of peripheral neuropathy and callus(es) left heel and right heel and painful thick toenails that are difficult to trim. Painful toenails interfere with ambulation. Aggravating factors include wearing enclosed shoe gear. Pain is relieved with periodic professional debridement. Painful calluses are aggravated when weightbearing with and without shoegear. Pain is relieved with periodic professional debridement.  Chief Complaint  Patient presents with   Nail Problem    "I need my toenails clipped and the calluses off my heels.  I have Neuropathy."   Patient states she purchased a new pair of sneakers. Continues to have pain on heels due to calluses.  New problem(s): None.   PCP is Avva, Ravisankar, MD.  Allergies  Allergen Reactions   Dextromethorphan Polistirex Er Other (See Comments)    SOB   Primidone     Severe Headaches   Albuterol     REACTION: tachycardia   Amoxicillin Diarrhea, Nausea Only and Other (See Comments)   Aspirin Other (See Comments)    Adult dose   Atorvastatin Other (See Comments)   Celecoxib Other (See Comments)   Clonazepam Hives and Other (See Comments)   Doxycycline Hyclate Other (See Comments)   Levaquin [Levofloxacin] Other (See Comments)    Per patient, caused tendonitis.    Loratadine    Nortriptyline     Increase heart rate.   Propranolol Hcl Other (See Comments)    Dizziness, Fatgue   Simvastatin     Myalgias    Ezetimibe Rash    Review of Systems: Negative except as noted in the HPI.  Objective: No changes noted in today's physical examination. Vitals:   04/13/23 1008  BP: (!) 155/62  Pulse: 100   Yarethzy [place] Cherubin is a pleasant 82 y.o. female thin build in NAD. AAO x 3.  Vascular Examination: Capillary refill time immediate b/l. Vascular status intact  b/l with palpable pedal pulses. Pedal hair diminished b/l. No pain with calf compression b/l. Skin temperature gradient WNL b/l. No cyanosis or clubbing b/l. No ischemia or gangrene noted b/l. No edema noted b/l LE.  Neurological Examination: Sensation grossly intact b/l with 10 gram monofilament. Vibratory sensation intact b/l. Pt has subjective symptoms of neuropathy.  Dermatological Examination: Pedal integument with normal turgor, texture and tone BLE. No open wounds b/l LE. No interdigital macerations noted b/l LE. Toenails 2-5 bilaterally well maintained with adequate length. No erythema, no edema, no drainage, no fluctuance. Hyperkeratotic lesion(s) left heel and right heel.  No erythema, no edema, no drainage, no fluctuance.  Musculoskeletal Examination: Muscle strength 5/5 to all lower extremity muscle groups bilaterally. Hammertoe(s) noted to the L 5th toe and R 5th toe.  Radiographs: None Assessment/Plan: 1. Pain due to onychomycosis of toenail   2. Callus   3. Porokeratosis   4. Neuropathic pain     -Patient was evaluated and treated. All patient's and/or POA's questions/concerns answered on today's visit. -Continue diabetic foot care principles: inspect feet daily, monitor glucose as recommended by PCP and/or Endocrinologist, and follow prescribed diet per PCP, Endocrinologist and/or dietician. -Patient to continue soft, supportive shoe gear daily. -Toenails were debrided in length and girth 2-5 left foot and 2-5 right foot with sterile nail nippers and dremel without iatrogenic bleeding.  -Callus(es) left heel and right heel pared utilizing sharp debridement with sterile blade without complication or incident. Total  number debrided =2. We discussed sleeping position and perhaps padding her heels if she sleeps on her side. She has purchased a new pair of [employer]. -Patient/POA to call should there be question/concern in the interim.   Return in about 3 months (around  07/14/2023).  Freddie Breech, DPM

## 2023-05-05 ENCOUNTER — Other Ambulatory Visit: Payer: Self-pay

## 2023-05-05 ENCOUNTER — Encounter (HOSPITAL_BASED_OUTPATIENT_CLINIC_OR_DEPARTMENT_OTHER): Payer: Self-pay | Admitting: Emergency Medicine

## 2023-05-05 ENCOUNTER — Emergency Department (HOSPITAL_BASED_OUTPATIENT_CLINIC_OR_DEPARTMENT_OTHER): Payer: Medicare PPO | Admitting: Radiology

## 2023-05-05 ENCOUNTER — Emergency Department (HOSPITAL_BASED_OUTPATIENT_CLINIC_OR_DEPARTMENT_OTHER)
Admission: EM | Admit: 2023-05-05 | Discharge: 2023-05-05 | Disposition: A | Discharge status: Home / Self Care | Payer: Medicare PPO | Attending: Emergency Medicine | Admitting: Emergency Medicine

## 2023-05-05 DIAGNOSIS — R0789 Other chest pain: Secondary | ICD-10-CM | POA: Diagnosis not present

## 2023-05-05 DIAGNOSIS — R918 Other nonspecific abnormal finding of lung field: Secondary | ICD-10-CM | POA: Diagnosis not present

## 2023-05-05 DIAGNOSIS — R062 Wheezing: Secondary | ICD-10-CM | POA: Insufficient documentation

## 2023-05-05 DIAGNOSIS — R079 Chest pain, unspecified: Secondary | ICD-10-CM | POA: Diagnosis not present

## 2023-05-05 DIAGNOSIS — M79602 Pain in left arm: Secondary | ICD-10-CM | POA: Diagnosis not present

## 2023-05-05 DIAGNOSIS — M79601 Pain in right arm: Secondary | ICD-10-CM | POA: Diagnosis not present

## 2023-05-05 DIAGNOSIS — Z7982 Long term (current) use of aspirin: Secondary | ICD-10-CM | POA: Insufficient documentation

## 2023-05-05 LAB — CBC
HCT: 40.4 % (ref 36.0–46.0)
Hemoglobin: 13.2 g/dL (ref 12.0–15.0)
MCH: 28.3 pg (ref 26.0–34.0)
MCHC: 32.7 g/dL (ref 30.0–36.0)
MCV: 86.5 fL (ref 80.0–100.0)
Platelets: 194 10*3/uL (ref 150–400)
RBC: 4.67 MIL/uL (ref 3.87–5.11)
RDW: 13.5 % (ref 11.5–15.5)
WBC: 5.9 10*3/uL (ref 4.0–10.5)
nRBC: 0 % (ref 0.0–0.2)

## 2023-05-05 LAB — BASIC METABOLIC PANEL
Anion gap: 10 (ref 5–15)
BUN: 16 mg/dL (ref 8–23)
CO2: 30 mmol/L (ref 22–32)
Calcium: 10.1 mg/dL (ref 8.9–10.3)
Chloride: 100 mmol/L (ref 98–111)
Creatinine, Ser: 0.97 mg/dL (ref 0.44–1.00)
GFR, Estimated: 58 mL/min — ABNORMAL LOW (ref >60)
Glucose, Bld: 153 mg/dL — ABNORMAL HIGH (ref 70–99)
Potassium: 3.3 mmol/L — ABNORMAL LOW (ref 3.5–5.1)
Sodium: 140 mmol/L (ref 135–145)

## 2023-05-05 LAB — TROPONIN I (HIGH SENSITIVITY): Troponin I (High Sensitivity): 4 ng/L (ref <18)

## 2023-05-05 MED ORDER — IPRATROPIUM BROMIDE 0.02 % IN SOLN
0.5000 mg | Freq: Once | RESPIRATORY_TRACT | Status: AC
  Administered 2023-05-05: 0.5 mg via RESPIRATORY_TRACT
  Filled 2023-05-05: qty 2.5

## 2023-05-05 MED ORDER — PREDNISONE 50 MG PO TABS
60.0000 mg | ORAL_TABLET | Freq: Once | ORAL | Status: AC
  Administered 2023-05-05: 60 mg via ORAL
  Filled 2023-05-05: qty 1

## 2023-05-05 MED ORDER — LEVALBUTEROL HCL 1.25 MG/0.5ML IN NEBU
1.2500 mg | INHALATION_SOLUTION | Freq: Once | RESPIRATORY_TRACT | Status: AC
  Administered 2023-05-05: 1.25 mg via RESPIRATORY_TRACT
  Filled 2023-05-05: qty 0.5

## 2023-05-05 MED ORDER — PREDNISONE 20 MG PO TABS: ORAL_TABLET | ORAL | 0 refills | Status: DC

## 2023-05-05 NOTE — Discharge Instructions (Signed)
I have prescribed you steroids.  Hopefully that helps with your difficulty breathing.  Please follow-up with your family doctor in the office.  Please return for worsening symptoms.

## 2023-05-05 NOTE — ED Provider Notes (Signed)
[place] EMERGENCY DEPARTMENT AT [facility] Provider Note   CSN: [id number] Arrival date & time: 05/05/23  1039     History  Chief Complaint  Patient presents with   Chest Pain   Arm Pain    Danielle Peters is a 82 y.o. female.  82 yo F with a chief complaint of bilateral chest pain.  Going on for about a month.  Seems worse with movement palpation twisting deep breathing.  She does cough chronically and does not feel like it is changed.  Last time she had symptoms like that she had pneumonia.  She is very easily winded with getting up and moving around which she thinks is new for her.  Denies trauma to the chest.  Denies abdominal pain.  Feels like she can only eat and drink without issue.   Chest Pain Arm Pain Associated symptoms include chest pain.       Home Medications Prior to Admission medications   Medication Sig Start Date End Date Taking? Authorizing Provider  predniSONE (DELTASONE) 20 MG tablet 2 tabs po daily x 4 days 05/05/23  Yes Melene Plan, DO  ALPRAZolam Prudy Feeler) 0.25 MG tablet  08/14/20   [provider]  aspirin 81 MG tablet Take 81 mg by mouth every other day.    [provider]  betamethasone dipropionate (DIPROLENE) 0.05 % cream Apply 1 application  topically 2 (two) times daily. 09/23/18   [provider]  brimonidine (ALPHAGAN) 0.2 % ophthalmic solution Ophthalmic for 37 Days 11/11/22   [provider]  diclofenac Sodium (VOLTAREN) 1 % GEL  03/31/21   [provider]  famotidine (PEPCID) 20 MG tablet 1 tablet at bedtime as needed Orally Once a day at bedtime for acid reflux for 90 days 08/28/22   [provider]  fexofenadine (ALLEGRA) 60 MG tablet Take 60 mg by mouth 2 (two) times daily.    [provider]  fluticasone (FLONASE) 50 MCG/ACT nasal spray SPRAY 2 SPRAYS INTO EACH NOSTRIL EVERY DAY 08/25/22   Young, Joni Fears D, MD  fluticasone furoate-vilanterol (BREO ELLIPTA) 100-25  MCG/ACT AEPB Inhale 1 puff into the lungs daily. Rinse mouth 02/24/22   Jetty Duhamel D, MD  loratadine (CLARITIN) 10 MG tablet Take 10 mg by mouth daily.    [provider]  meclizine (ANTIVERT) 25 MG tablet Take 1 tablet (25 mg total) by mouth 3 (three) times daily as needed for dizziness. 02/24/22   Jetty Duhamel D, MD  Moxifloxacin HCl 0.5 % SOLN     [provider]  ondansetron (ZOFRAN) 4 MG tablet Take 4 mg by mouth every 8 (eight) hours as needed for nausea or vomiting.    [provider]  pantoprazole (PROTONIX) 40 MG tablet Take 40 mg by mouth 2 (two) times daily. 08/28/22   [provider]  polyethylene glycol (MIRALAX / GLYCOLAX) 17 g packet Take 17 g by mouth daily as needed.    [provider]  primidone (MYSOLINE) 50 MG tablet Take 50 mg by mouth 2 (two) times daily. Take 2 tablets by mouth 2 times daily.    [provider]  sodium chloride (MURO 128) 5 % ophthalmic solution     [provider]  tiZANidine (ZANAFLEX) 2 MG tablet 1 tab TID as needed for muscle spasms **cash pay if not covered by insurance** 02/11/22   Croitoru, Mihai, MD  valsartan-hydrochlorothiazide (DIOVAN-HCT) 80-12.5 MG tablet TAKE 1 TABLET BY MOUTH EVERY DAY 01/26/23   Croitoru, [place],  MD      Allergies    Dextromethorphan polistirex er, Primidone, Albuterol, Amoxicillin, Aspirin, Atorvastatin, Celecoxib, Clonazepam, Doxycycline hyclate, Levaquin [levofloxacin], Loratadine, Nortriptyline, Propranolol hcl, Simvastatin, and Ezetimibe    Review of Systems   Review of Systems  Cardiovascular:  Positive for chest pain.    Physical Exam Updated Vital Signs BP (!) 178/78   Pulse 93   Temp 97.9 F (36.6 C) (Temporal)   Resp 13   Ht 5\' 3"  (1.6 m)   Wt 62.6 kg   SpO2 98%   BMI 24.45 kg/m  Physical Exam Vitals and nursing note reviewed.  Constitutional:      General: She is not in acute distress.    Appearance: She is well-developed. She is not  diaphoretic.  HENT:     Head: Normocephalic and atraumatic.  Eyes:     Pupils: Pupils are equal, round, and reactive to light.  Cardiovascular:     Rate and Rhythm: Normal rate and regular rhythm.     Heart sounds: No murmur heard.    No friction rub. No gallop.  Pulmonary:     Effort: Pulmonary effort is normal.     Breath sounds: Wheezing present. No rales.     Comments: Diffuse wheezes and prolonged expiratory effort Chest:     Chest wall: Tenderness present.     Comments: Tenderness diffusely about the chest. Abdominal:     General: There is no distension.     Palpations: Abdomen is soft.     Tenderness: There is no abdominal tenderness.  Musculoskeletal:        General: No tenderness.     Cervical back: Normal range of motion and neck supple.     Right lower leg: No edema.     Left lower leg: No edema.  Skin:    General: Skin is warm and dry.  Neurological:     Mental Status: She is alert and oriented to person, place, and time.  Psychiatric:        Behavior: Behavior normal.     ED Results / Procedures / Treatments   Labs (all labs ordered are listed, but only abnormal results are displayed) Labs Reviewed  BASIC METABOLIC PANEL - Abnormal; Notable for the following components:      Result Value   Potassium 3.3 (*)    Glucose, Bld 153 (*)    GFR, Estimated 58 (*)    All other components within normal limits  CBC  TROPONIN I (HIGH SENSITIVITY)    EKG EKG Interpretation Date/Time:  Wednesday May 05 2023 10:59:12 EDT Ventricular Rate:  117 PR Interval:  152 QRS Duration:  118 QT Interval:  338 QTC Calculation: 471 R Axis:   60  Text Interpretation: Sinus tachycardia Possible Anterior infarct , age undetermined ST & T wave abnormality, consider lateral ischemia Abnormal ECG background noise TECHNICALLY DIFFICULT Otherwise no significant change Confirmed by Melene Plan [phone]) on 05/05/2023 11:02:42 AM  Radiology DG Chest 2 View  Result Date:  05/05/2023 CLINICAL DATA:  Left upper chest and bilateral arm pain EXAM: CHEST - 2 VIEW COMPARISON:  Chest radiograph dated 06/23/2022 FINDINGS: Surgical clips project over the right axilla. Right upper quadrant surgical clips. Normal lung volumes. Right basilar linear atelectasis/scarring. No pleural effusion or pneumothorax. The heart size and mediastinal contours are within normal limits. No acute osseous abnormality. IMPRESSION: Right basilar linear atelectasis/scarring. No acute cardiopulmonary process. Electronically Signed   By: Agustin Cree M.D.   On: 05/05/2023 11:38  Procedures Procedures    Medications Ordered in ED Medications  levalbuterol (XOPENEX) nebulizer solution 1.25 mg (1.25 mg Nebulization Given 05/05/23 1226)  ipratropium (ATROVENT) nebulizer solution 0.5 mg (0.5 mg Nebulization Given 05/05/23 1226)  predniSONE (DELTASONE) tablet 60 mg (60 mg Oral Given 05/05/23 1323)    ED Course/ Medical Decision Making/ A&P                             Medical Decision Making Amount and/or Complexity of Data Reviewed Labs: ordered. Radiology: ordered.  Risk Prescription drug management.   82 yo F with a chief complaint of chest pain this is been going on for a month.  She does have pain is reproduced on exam.  She has a troponin is negative.  No anemia.  Very modest bump in her renal function.  Diffuse wheezes.  Will give a breathing treatment here.  Reassess.  Patient feeling better after breathing treatment.  Will give the patient burst of steroids.  Have her follow-up with her family doctor in the office.  1:39 PM:  I have discussed the diagnosis/risks/treatment options with the patient and caregiver.  Evaluation and diagnostic testing in the emergency department does not suggest an emergent condition requiring admission or immediate intervention beyond what has been performed at this time.  They will follow up with PCP. We also discussed returning to the ED immediately if new or  worsening sx occur. We discussed the sx which are most concerning (e.g., sudden worsening pain, fever, inability to tolerate by mouth) that necessitate immediate return. Medications administered to the patient during their visit and any new prescriptions provided to the patient are listed below.  Medications given during this visit Medications  levalbuterol (XOPENEX) nebulizer solution 1.25 mg (1.25 mg Nebulization Given 05/05/23 1226)  ipratropium (ATROVENT) nebulizer solution 0.5 mg (0.5 mg Nebulization Given 05/05/23 1226)  predniSONE (DELTASONE) tablet 60 mg (60 mg Oral Given 05/05/23 1323)     The patient appears reasonably screen and/or stabilized for discharge and I doubt any other medical condition or other [facility] requiring further screening, evaluation, or treatment in the ED at this time prior to discharge.          Final Clinical Impression(s) / ED Diagnoses Final diagnoses:  Nonspecific chest pain    Rx / DC Orders ED Discharge Orders          Ordered    predniSONE (DELTASONE) 20 MG tablet        05/05/23 1320              Melene Plan, DO 05/05/23 1339

## 2023-05-05 NOTE — ED Triage Notes (Signed)
Pt c/o chest pain in upper L chest and bilaterally upper arms. Reports having chest pain for about a month intermittent. Been taking Tylenol, Ibuprofen and OTC medications with no relief. Sees Cards periodically.

## 2023-05-05 NOTE — ED Notes (Signed)
Pt discharged to home using teachback Method. Discharge instructions have been discussed with patient and/or family members. Pt verbally acknowledges understanding d/c instructions, has been given opportunity for questions to be answered, and endorses comprehension to checkout at registration before leaving.  

## 2023-05-11 DIAGNOSIS — H903 Sensorineural hearing loss, bilateral: Secondary | ICD-10-CM | POA: Diagnosis not present

## 2023-05-12 DIAGNOSIS — H4051X3 Glaucoma secondary to other eye disorders, right eye, severe stage: Secondary | ICD-10-CM | POA: Diagnosis not present

## 2023-05-14 ENCOUNTER — Telehealth: Payer: Self-pay

## 2023-05-14 NOTE — Telephone Encounter (Signed)
Transition Care Management Unsuccessful Follow-up Telephone Call  Date of discharge and from where:  [facility] 7/17  Attempts:  1st Attempt  Reason for unsuccessful TCM follow-up call:  No answer/busy   Lenard Forth [facility] Guide, [facility] [phone] [address] [place], [place], [place] [address] Phone: [phone] Email: [email or web address]

## 2023-05-17 ENCOUNTER — Telehealth: Payer: Self-pay

## 2023-05-17 NOTE — Telephone Encounter (Signed)
Transition Care Management Unsuccessful Follow-up Telephone Call  Date of discharge and from where:  [facility] 7/17  Attempts:  2nd Attempt  Reason for unsuccessful TCM follow-up call:  No answer/busy   Lenard Forth [facility] Guide, [facility] [phone] [address] [place], [place], [place] [address] Phone: [phone] Email: [email or web address]

## 2023-05-18 ENCOUNTER — Telehealth: Payer: Self-pay | Admitting: Internal Medicine

## 2023-05-18 NOTE — Telephone Encounter (Signed)
Na

## 2023-05-21 ENCOUNTER — Ambulatory Visit: Payer: Medicare PPO | Admitting: Internal Medicine

## 2023-05-21 ENCOUNTER — Encounter: Payer: Self-pay | Admitting: Internal Medicine

## 2023-05-21 VITALS — BP 138/60 | HR 92 | Temp 97.7°F | Ht 64.0 in | Wt 137.0 lb

## 2023-05-21 DIAGNOSIS — J441 Chronic obstructive pulmonary disease with (acute) exacerbation: Secondary | ICD-10-CM

## 2023-05-21 DIAGNOSIS — J4489 Other specified chronic obstructive pulmonary disease: Secondary | ICD-10-CM | POA: Diagnosis not present

## 2023-05-21 DIAGNOSIS — M791 Myalgia, unspecified site: Secondary | ICD-10-CM | POA: Diagnosis not present

## 2023-05-21 MED ORDER — IPRATROPIUM-ALBUTEROL 0.5-2.5 (3) MG/3ML IN SOLN
3.0000 mL | Freq: Once | RESPIRATORY_TRACT | Status: AC
Start: 2023-05-21 — End: 2023-05-21
  Administered 2023-05-21: 3 mL via RESPIRATORY_TRACT

## 2023-05-21 MED ORDER — FLUTICASONE-SALMETEROL 250-50 MCG/ACT IN AEPB: INHALATION_SPRAY | RESPIRATORY_TRACT | 11 refills | Status: DC

## 2023-05-21 NOTE — Patient Instructions (Addendum)
Order- nebulizer treatment Duoneb  dx Chronic bronchitis  Script sent changing your Breo inhaler to Advair- inhale 1 puff then rinse mouth, twice daily  Try a heating pad or Voltaren ointment for the ache in your back and arms  Ok to keep appointment on November 8

## 2023-05-21 NOTE — Progress Notes (Signed)
HPI  Female never smoker followed for sarcoid, chronic bronchitis complicated by history of right breast cancer/ XRT, tachycardia, GERD, anxiety, glaucoma, tremor.[name]  PCP Dr Irving Burton Med Cardiac Study 11/25/16- EF 77%, hyperdynamic, otw  WNL Office Spirometry 12/31/2016-mild restriction of exhaled volume. FVC 1.61/77%, FEV1 1.37/86%, ratio 0.85, FEF 25-75% 2.00/ 144% -----------------------------------------------------------------------------------------.   08/25/22-  82 year old female never smoker followed for Sarcoid, Chronic Bronchitis, accompanied by history right breast CA/XRT, Tachycardia, GERD, anxiety, Glaucoma, Tremor, HTN,  -Breo 100, Spiriva Respimat, Flonase, Claritin,  Covid vax-3 Phizer Flu vax-today senior ------Pt states she is okay. Here with a caregiver today.  Danielle Peters is still having the same pains across her back and chest for which she was seen at ED in September.  That work-up was unrevealing.  Tylenol controls the pain.  There has been no progression or change in pattern.  It is aggravated by lifting trash can and is likely musculoskeletal/arthritic. After leaving here she will go for evaluation of leg veins assessing bilateral lower extremity edema.  She has follow-up appointment with cardiology next month. She denies change in her breathing.  Stable cough is dry. CXR 06/23/22-  IMPRESSION: RIGHT lung base scarring. No acute abnormalities.  05/21/23- 82 year old female never smoker followed for Sarcoid, Chronic Bronchitis, accompanied by hx Right Breast CA/XRT, Tachycardia, GERD, anxiety, Glaucoma, Tremor, HTN,  -Breo 100, Spiriva Respimat, Flonase, Claritin, ED 7/17 for atypical chest pain, bilateral x 1 month, incr with movement, reproduced on exam. -----Breathing has not the been the best Comes with an aide. Several somatic complaints. Pain at right scapula. Note recent eval for various anterior chest pains. Eyes water- her eye doctor told her she is ok. Insurance  won't cover Breo- we will change to Symbicort.  No recent significant increased wheeze or cough, but she asks to try a nebulizer treatment here CXR 05/05/23-  IMPRESSION: Right basilar linear atelectasis/scarring. No acute cardiopulmonary process.  ROS- see HPI  + = positive Constitutional:   No-   weight loss, night sweats, fevers, chills,+ fatigue, lassitude. HEENT:   No-  headaches, difficulty swallowing, tooth/dental problems, sore throat,       No-  sneezing, itching, ear ache, nasal congestion, +post nasal drip,  CV:   chest pain, orthopnea, PND, swelling in lower extremities, anasarca, dizziness, palpitations Resp: No-   shortness of breath with exertion or at rest.              productive cough,  + non-productive cough,  No-  coughing up of blood.              No-   change in color of mucus.  +wheezing.   Skin: +HPI GI:  No-   heartburn, indigestion, abdominal pain, nausea, vomiting,  GU:. MS:  No-   joint pain or swelling. + pain across mid thoracic back around to sides. 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 No depression or anxiety.  No memory loss.  Obj General- Alert, Oriented, Affect-appropriate, Distress- none acute,                     + Always slumped, almost kyphotic posture Skin- + hyperpigmentation  Several isolated spots Lymphadenopathy- none Head- atraumatic            Eyes- Gross vision intact, PERRLA, conjunctivae clear secretions            Ears- Hearing, canals-normal  Nose- Clear, no-Septal dev, mucus, polyps, erosion, perforation             Throat- Mallampati II , mucosa clear , drainage- none, tonsils- atrophic. + Dentures Neck- flexible , trachea midline, no stridor , thyroid nl, carotid no bruit Chest - symmetrical excursion , unlabored           Heart/CV- RRR with skipped beats , no murmur , no gallop  , no rub, nl s1 s2                - JVD- none , edema- none, stasis changes- none, varices-  none           Lung- + inspiratory squeaks diffuse ,  Work of breathing is not increased.   cough- none , dullness-none, rub- none           Chest wall- +treatment Scar R breast. Mild superficial soreness to touch over R upper back- no bruise or rash. Abd- No HSM Br/ Gen/ Rectal- Not done, not indicated Extrem- cyanosis- none, clubbing, none, atrophy- none, strength- nl Neuro- + significant tremor hands

## 2023-05-26 ENCOUNTER — Ambulatory Visit: Payer: Medicare PPO | Attending: Cardiovascular Disease | Admitting: Cardiovascular Disease

## 2023-05-26 ENCOUNTER — Encounter: Payer: Self-pay | Admitting: Cardiovascular Disease

## 2023-05-26 ENCOUNTER — Other Ambulatory Visit: Payer: Self-pay | Admitting: Cardiovascular Disease

## 2023-05-26 VITALS — BP 132/72 | HR 94 | Ht 64.0 in | Wt 136.0 lb

## 2023-05-26 DIAGNOSIS — G250 Essential tremor: Secondary | ICD-10-CM

## 2023-05-26 DIAGNOSIS — I471 Supraventricular tachycardia, unspecified: Secondary | ICD-10-CM

## 2023-05-26 DIAGNOSIS — I951 Orthostatic hypotension: Secondary | ICD-10-CM

## 2023-05-26 DIAGNOSIS — D860 Sarcoidosis of lung: Secondary | ICD-10-CM

## 2023-05-26 DIAGNOSIS — E7800 Pure hypercholesterolemia, unspecified: Secondary | ICD-10-CM | POA: Diagnosis not present

## 2023-05-26 DIAGNOSIS — I447 Left bundle-branch block, unspecified: Secondary | ICD-10-CM | POA: Diagnosis not present

## 2023-05-26 MED ORDER — PRIMIDONE 50 MG PO TABS: 50.0000 mg | ORAL_TABLET | Freq: Two times a day (BID) | ORAL | 0 refills | Status: DC

## 2023-05-26 NOTE — Telephone Encounter (Signed)
Hey carlyle, Looks like this medication was refilled today at [facility].  Pharmacy would like some clarification due to 2 different instructions.

## 2023-05-26 NOTE — Progress Notes (Signed)
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  She was seen in the emergency room 05/05/2023 for chest pain bilaterally that was associated with movement she was wheezing at the time and her chest was tender to palpation.  Her ECG showed chronic abnormalities.  Cardiac troponin was normal.  Potassium was low at 3.3.  She was given a course of steroids to help with musculoskeletal pain.  Her echocardiogram in January was essentially normal with the exception of age-related diastolic dysfunction and mild increase in estimated PA pressure of 41 mmHg.  EF was 60 to 65%. E/e' was indeterminate (11-13).  She has not recently troubled by palpitations and denies syncope.  She does have dizziness when she changes positions too quickly.  She does not have orthopnea, PND or lower extremity edema. She has tremor in her hands.  She recently ran out of the primidone.  She has a follow-up visit with her neurologist in September.  I gave her a refill of the medication to last her until then.  She has longstanding problems with severe hypercholesterolemia.  However she has been intolerant to rosuvastatin, atorvastatin, simvastatin, pravastatin all of which cause muscle aches.  Tried Zetia but this caused a rash.  CT of the chest in 2019 shows minimal coronary artery calcifications.  She had normal perfusion on nuclear stress test in 2020.  She has a longstanding history of an active sarcoidosis.  She has an  appointment with Dr. Maple Hudson in November.  Past Medical History:  Diagnosis Date   Anemia    Anxiety    Breast cancer (HCC) 2004   right   Cancer [facility])    breast ca   Chest pain    echo 01/08/10- nl lv; mild aortic sclerosis; myoview 01/08/10- no ischemia   Dyslipidemia    myaligia with statins   Esophageal reflux    Hypertension    Neuropathy    Personal history of chemotherapy 2004   Personal history of radiation therapy 2004   Pulmonary sarcoidosis (HCC)    Shingles 03/2016   Tremor    upper extremities   Ventricular tachycardia (paroxysmal) (HCC)    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; AV node reentry tachycardia ablation    Past Surgical History:  Procedure Laterality Date   Boil Right 03/2015   on buttock, excision   BREAST LUMPECTOMY Right 2004   CHOLECYSTECTOMY     left breast lumpectomy,chemo  2004   xrt   SKIN BIOPSY     TUBAL LIGATION     UPPER GI ENDOSCOPY  02/2015   difficulty swallowing, Abd pain     Current Meds  Medication Sig   ALPRAZolam (XANAX) 0.25 MG tablet    betamethasone dipropionate (DIPROLENE) 0.05 % cream Apply 1 application  topically 2 (two) times daily.   brimonidine (ALPHAGAN) 0.2 % ophthalmic solution Ophthalmic for 37 Days   diclofenac Sodium (VOLTAREN) 1 % GEL    fluticasone (FLONASE) 50 MCG/ACT nasal spray SPRAY 2 SPRAYS INTO EACH NOSTRIL EVERY DAY   fluticasone-salmeterol (ADVAIR) 250-50 MCG/ACT  AEPB Inhale 1 puff then rinse mouth, twice daily   loratadine (CLARITIN) 10 MG tablet Take 10 mg by mouth daily.   Moxifloxacin HCl 0.5 % SOLN    ondansetron (ZOFRAN) 4 MG tablet Take 4 mg by mouth every 8 (eight) hours as needed for nausea or vomiting.   pantoprazole (PROTONIX) 40 MG tablet Take 40 mg by mouth 2 (two) times daily.   sodium chloride (MURO 128) 5 % ophthalmic solution    tiZANidine (ZANAFLEX) 2 MG tablet 1 tab TID as needed for muscle spasms **cash pay if not covered by insurance**   valsartan-hydrochlorothiazide (DIOVAN-HCT) 80-12.5  MG tablet TAKE 1 TABLET BY MOUTH EVERY DAY   [DISCONTINUED] primidone (MYSOLINE) 50 MG tablet Take 50 mg by mouth 2 (two) times daily. Take 2 tablets by mouth 2 times daily.   Current Facility-Administered Medications for the 05/26/23 encounter (Office Visit) with Alayiah Fontes, Rachelle Hora, MD  Medication   albuterol (PROVENTIL) (2.5 MG/3ML) 0.083% nebulizer solution 2.5 mg     Allergies:   Dextromethorphan polistirex er, Primidone, Albuterol, Amoxicillin, Aspirin, Atorvastatin, Celecoxib, Clonazepam, Doxycycline hyclate, Levaquin [levofloxacin], Loratadine, Nortriptyline, Propranolol hcl, Simvastatin, and Ezetimibe   Social History   Tobacco Use   Smoking status: Never   Smokeless tobacco: Never  Vaping Use   Vaping status: Never Used  Substance Use Topics   Alcohol use: No   Drug use: No     Family Hx: The patient's family history includes Cancer in her mother; Heart attack in her sister; Heart failure (age of onset: 82) in her father. There is no history of Breast cancer.  ROS:   Please see the history of present illness.   All other systems are reviewed and are negative.   Prior CV studies:   The following studies were reviewed today: Pharmacological stress test November 28, 2018  Labs/Other Tests and Data Reviewed:    EKG: Personally reviewed the tracing from ED visit on 05/05/2023 which is very similar to previous tracings and shows tachycardia, left bundle branch block, QRS 120 ms, QTc 470 ms, lots of artifact from hand tremor  Recent Labs: 06/23/2022: ALT 11 10/23/2022: Magnesium 2.1 05/05/2023: BUN 16; Creatinine, Ser 0.97; Hemoglobin 13.2; Platelets 194; Potassium 3.3; Sodium 140   Recent Lipid Panel No results found for: "CHOL", "TRIG", "HDL", "CHOLHDL", "LDLCALC", "LDLDIRECT"  05/17/2020 Chol 277, HDL 88, LDL170, TG 94 Creatinine 0.8, hemoglobin 12.8, TSH 1.34 07/18/2021 Cholesterol 271, HDL 74, LDL 175, triglycerides 112 Hemoglobin A1c 5.8%, potassium 3.8, ALT 13  Wt  Readings from Last 3 Encounters:  05/26/23 136 lb (61.7 kg)  05/21/23 137 lb (62.1 kg)  05/05/23 138 lb (62.6 kg)     Objective:    Vital Signs:  BP 132/72 (BP Location: Left Arm, Patient Position: Sitting, Cuff Size: Normal)   Pulse 94   Ht 5\' 4"  (1.626 m)   Wt 136 lb (61.7 kg)   SpO2 98%   BMI 23.34 kg/m     General: Alert, oriented x3, no distress Head: no evidence of trauma, PERRL, EOMI, no exophtalmos or lid lag, no myxedema, no xanthelasma; normal ears, nose and oropharynx Neck: normal jugular venous pulsations and no hepatojugular reflux; brisk carotid pulses without delay and no carotid bruits Chest: clear to auscultation, no signs of consolidation by percussion or palpation, normal fremitus, symmetrical and full respiratory excursions Cardiovascular: normal position and quality of the apical impulse, regular rhythm, normal first and second heart sounds, no murmurs, rubs or gallops Abdomen: no tenderness or distention, no  masses by palpation, no abnormal pulsatility or arterial bruits, normal bowel sounds, no hepatosplenomegaly Extremities: no clubbing, cyanosis or edema; 2+ radial, ulnar and brachial pulses bilaterally; 2+ right femoral, posterior tibial and dorsalis pedis pulses; 2+ left femoral, posterior tibial and dorsalis pedis pulses; no subclavian or femoral bruits Neurological: Bilateral hand resting tremor, otherwise nonfocal Psych: Normal mood and affect     ASSESSMENT & PLAN:    1. Orthostatic hypotension   2. SVT (supraventricular tachycardia)   3. Hypercholesterolemia   4. LBBB (left bundle branch block)   5. Pulmonary sarcoidosis (HCC)   6. Essential tremor       Orthostatic hypotension: Seems to be less symptomatic.  Blood pressure today is normal.  Tolerate systolic blood pressure as high as 150 due to her tendency to have orthostatic hypotension.  Avoid diuretics as much as possible. Palpitations: History of SVT.  No longer on propranolol.  Has not  had any symptomatic recurrence. HLP: Intolerant of atorva/Simva/Rosuva/pravastatin.  Had a rash with ezetimibe.  No clinically evident CAD or PAD.  Only minimal coronary calcification seen on a previous CT of the chest.  Will manage conservatively. LBBB: previously intermittent, now permanent.  No signs of cardiomyopathy/congestive heart failure to date. Pulmonary sarcoidosis: Currently quiescent.  Followed by Dr. Maple Hudson. Essential tremor: On primidone, but has run out of the prescription, sees Dr. Terrace Arabia.  Refilled her prescription to last her until that follow-up appointment.  Patient Instructions  Medication Instructions:  Your physician recommends that you continue on your current medications as directed. Please refer to the Current Medication list given to you today.  *If you need a refill on your cardiac medications before your next appointment, please call your pharmacy*  Follow-Up: At [facility], you and your health needs are our priority.  As part of our continuing mission to provide you with exceptional heart care, we have created designated Provider Care Teams.  These Care Teams include your primary Cardiologist (physician) and Advanced Practice Providers (APPs -  Physician Assistants and Nurse Practitioners) who all work together to provide you with the care you need, when you need it  Your next appointment:   1 year  Provider:   Thurmon Fair, MD      Signed, Thurmon Fair, MD  05/29/2023 7:26 PM    [facility]

## 2023-05-26 NOTE — Patient Instructions (Addendum)
Medication Instructions:  Your physician recommends that you continue on your current medications as directed. Please refer to the Current Medication list given to you today.  *If you need a refill on your cardiac medications before your next appointment, please call your pharmacy*  Follow-Up: At [facility], you and your health needs are our priority.  As part of our continuing mission to provide you with exceptional heart care, we have created designated Provider Care Teams.  These Care Teams include your primary Cardiologist (physician) and Advanced Practice Providers (APPs -  Physician Assistants and Nurse Practitioners) who all work together to provide you with the care you need, when you need it.  Your next appointment:   1 year  Provider:   Thurmon Fair, MD

## 2023-06-04 ENCOUNTER — Ambulatory Visit: Payer: Medicare PPO | Admitting: Cardiovascular Disease

## 2023-06-04 DIAGNOSIS — C50919 Malignant neoplasm of unspecified site of unspecified female breast: Secondary | ICD-10-CM | POA: Diagnosis not present

## 2023-06-04 DIAGNOSIS — K219 Gastro-esophageal reflux disease without esophagitis: Secondary | ICD-10-CM | POA: Diagnosis not present

## 2023-06-04 DIAGNOSIS — I119 Hypertensive heart disease without heart failure: Secondary | ICD-10-CM | POA: Diagnosis not present

## 2023-06-04 DIAGNOSIS — J449 Chronic obstructive pulmonary disease, unspecified: Secondary | ICD-10-CM | POA: Diagnosis not present

## 2023-06-04 DIAGNOSIS — M5090 Cervical disc disorder, unspecified, unspecified cervical region: Secondary | ICD-10-CM | POA: Diagnosis not present

## 2023-06-04 DIAGNOSIS — I471 Supraventricular tachycardia, unspecified: Secondary | ICD-10-CM | POA: Diagnosis not present

## 2023-06-04 DIAGNOSIS — R251 Tremor, unspecified: Secondary | ICD-10-CM | POA: Diagnosis not present

## 2023-06-04 DIAGNOSIS — E785 Hyperlipidemia, unspecified: Secondary | ICD-10-CM | POA: Diagnosis not present

## 2023-06-09 ENCOUNTER — Encounter: Payer: Self-pay | Admitting: Internal Medicine

## 2023-06-09 NOTE — Assessment & Plan Note (Signed)
Musculoskeletal chest wall pains- Plan- try local heat, Voltaren topical ointment

## 2023-06-09 NOTE — Assessment & Plan Note (Addendum)
Will see if neb treatment where with Duoneb eases chest wall discomforts. Change Breo to Symbicort 160 due to insurance.

## 2023-06-25 ENCOUNTER — Telehealth: Payer: Self-pay | Admitting: Internal Medicine

## 2023-06-25 NOTE — Telephone Encounter (Signed)
Patient is taking Wixela 250-50mg  and it causes her to be nauseas and have headaches. She would like for Dr.Young to prescribe her something else.

## 2023-06-28 NOTE — Telephone Encounter (Signed)
Offer Breyna 160 (generic for Symbicort)   # 1, ref x 5 Inhale 2 puffs then rinse mouth, twice daily

## 2023-06-29 NOTE — Telephone Encounter (Signed)
Patient checking on message for inhaler. Patient phone number is [phone].

## 2023-06-30 MED ORDER — BUDESONIDE-FORMOTEROL FUMARATE 160-4.5 MCG/ACT IN AERO: 2.0000 | INHALATION_SPRAY | Freq: Two times a day (BID) | RESPIRATORY_TRACT | 5 refills | Status: DC

## 2023-06-30 NOTE — Telephone Encounter (Signed)
Breyna sent in and patient has been notified.

## 2023-07-04 ENCOUNTER — Other Ambulatory Visit: Payer: Self-pay | Admitting: Cardiovascular Disease

## 2023-07-05 NOTE — Telephone Encounter (Signed)
Defer to Neurologist or PCP please

## 2023-07-05 NOTE — Telephone Encounter (Signed)
Pt's pharmacy is requesting a refill on primidone. This is not a cardiac medication. This medication is usually refilled by neurologist, Dr. Terrace Arabia, but previously refilled by Dr. Royann Shivers would Dr. Royann Shivers like to refill this non cardiac medication? Please address

## 2023-07-06 ENCOUNTER — Telehealth: Payer: Self-pay | Admitting: Neurology

## 2023-07-06 ENCOUNTER — Encounter: Payer: Self-pay | Admitting: Neurology

## 2023-07-06 ENCOUNTER — Ambulatory Visit: Payer: Medicare PPO | Admitting: Neurology

## 2023-07-06 VITALS — BP 134/73 | HR 91 | Ht 64.0 in | Wt 137.5 lb

## 2023-07-06 DIAGNOSIS — G250 Essential tremor: Secondary | ICD-10-CM | POA: Diagnosis not present

## 2023-07-06 DIAGNOSIS — R52 Pain, unspecified: Secondary | ICD-10-CM | POA: Insufficient documentation

## 2023-07-06 DIAGNOSIS — R269 Unspecified abnormalities of gait and mobility: Secondary | ICD-10-CM | POA: Diagnosis not present

## 2023-07-06 MED ORDER — DULOXETINE HCL 30 MG PO CPEP: 30.0000 mg | ORAL_CAPSULE | Freq: Every day | ORAL | 11 refills | Status: DC

## 2023-07-06 NOTE — Progress Notes (Signed)
ASSESSMENT AND PLAN 82 y.o. year old female   Tremor  Action more than resting component, there was no bradykinesia, rigidity,  Over the years tried low-dose beta-blocker, clonazepam, Xanax with limited help,  We have talked about other possibilities such as Sinemet, " if I do not have Parkinson's disease I do not want to try it", she also does not want to be seen by [facility] movement specialist  Now is back on primidone 50 mg 2 tablets twice a day  Gait abnormality  She had the slow worsening gait abnormality, likely due to combination of aging, deconditioning, fixed cervical anterocollis, limb deep muscle achy pain  MRI of the brain in March 2022 showed mild small vessel disease generalized atrophy  Laboratory evaluation including inflammatory markers,  Referred to physical therapy  Add on low-dose Cymbalta 30 mg daily   DIAGNOSTIC DATA (LABS, IMAGING, TESTING) - I reviewed patient records, labs, notes, testing and imaging myself where available.  Addendum: Laboratory evaluation from primary care physician Dr. Chilton Greathouse, MD September 2022, glucose 92, GFR 68.8 creatinine 0.8, normal CMP, CBC hemoglobin of 12.8, LDL 175, cholesterol 271, X5M 5.8, positive COVID-19 July 04, 2021 rapid antigen test  HISTORY OF PRESENT ILLNESS: Mrs. Scale is a 82 year old right-handed American female, follow up for essential tremor     She has past medical history of hypertension, hyperlipidemia, anxiety, history of right breast cancer, status post lobectomy in 2004, followed by radiation and chemotherapy, she developed bilateral hands paresthesia following chemotherapy consistent with-induced peripheral neuropathy. She presenting with few years history of bilateral hands tremor, getting worse when she stretched out her hands, or using utensils, she has describe difficulty feeding herself, cause a lot of social embarrasement, she denies significant gait difficulty, she also has head  titubation She denied bilateral lower exremity paresthesia weakness, but does complain fingertips numbness tingling. She denied loss of smell, REM sleep disorder, has anxiety symptoms, denied family history of tremor Over the past few years, we have tried primadone, while taking 50mg  1/2 once a day , there was no significant improvement at that time, she also complains of headaches, but also tried diazepam, Ativan, without significant improvement   UPDATE Oct 08 2014:  She is no longer taking primidone, she is taking clonazepam 0.5 mg half to one tablets every morning, which has helped her tremor, she complains of anxiety, but does not want to take more medications for it   UPDATE Oct 07 2016:  She took clonazepam 0.5 milligrams half to 1 tablet as needed for tremor for a while without significant improvement, she continue have worsening bilateral hands resting and posturing tremor, occasionally involving bilateral lower extremity, she denies significant gait abnormality, she exercise regularly, she complains of recent onset of left-sided low back pain, radiating pain to left lower extremity, left calf muscle. She is taking Lyrica 75 mg a day   She just healed from right arm shingles in June 2017, still has intermittent itching in her right arm and hand,   UPDATE January 05 2017: She complains of constant bilateral hand resting and posturing tremor, previously tried primidone, clonazepam with limited help, currently she is taking Valium 2 mg as needed, which helps her some, she previously took metoprolol low-dose did not notice any help for her tremor,   This is most consistent with essential tremor, there was no family history of similar disease, no parkinsonian features.   Her low back pain has much improved, she has stopped taking nortriptyline  UPDATE May 31 2018: Her older sister has parkinson's disease.  She now noticed worsening bilateral hands tremor, previously could not tolerate  clonazepam cause excessive drowsiness, beta-blocker, she did not notice any significant benefit, she is now taking primidone 50 mg twice a day, higher dose would cause significant drowsiness, she denied loss of sense of smell, she denies significant gait abnormality.  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 since [address]34, her brother check on her recently, she also complains of anxiety, grieving, one of her brother died last week, she has good appetite, difficulty sleeping, living apartment by herself,  UPDATE June 10 2021: She complains of bilateral feet paresthesia, was seen by podiatrist, has calluses on her feet, this also increase her mildly unsteady gait  She continue bothered by significant bilateral hand posturing tremor, difficulty with daily activity, such as holding utensils, currently on primidone 50 mg 1/1.5 tab a day, will add on beta-blocker  UPDATE Sept 17 2024: She is accompanied by caregiver at today's visit, overall doing very well, slow worsening large amplitude action tremor, on tramadol 50 mg 2 tablets twice a day, put major limitation in her daily activity, mild gait abnormality  Today's examination also demonstrate muscle pain upon deep palpitation,   PHYSICAL EXAM  Vitals:   07/06/23 1120  BP: 134/73  Pulse: 91  Weight: 137 lb 8 oz (62.4 kg)  Height: 5\' 4"  (1.626 m)   Body mass index is 23.6 kg/m.   PHYSICAL EXAMNIATION:  Gen: NAD, conversant, well nourised, well groomed      NEUROLOGICAL EXAM:  MENTAL STATUS: Speech/Cognition: Awake, alert, normal speech, oriented to history taking and casual conversation.  CRANIAL NERVES: CN II: Visual fields are full to confrontation.  Pupils are round equal and briskly reactive to light. CN III,  IV, VI: extraocular movement are normal. No ptosis. CN V: Facial sensation is intact to light touch. CN VII: Face is symmetric with normal eye closure and smile. CN VIII: Hearing is normal to casual conversation CN IX, X: Palate elevates symmetrically. Phonation is normal. CN XI: Head turning and shoulder shrug are intact   MOTOR: Action more than resting bilateral hands large amplitude tremor, normal strength no rigidity no bradykinesia  REFLEXES: Reflexes are 1 and symmetric at the biceps, triceps, knees and ankles. Plantar responses are flexor.  SENSORY: Intact to light touch, pinprick, positional and vibratory sensation at fingers and toes.  COORDINATION: There is no trunk or limb ataxia.    GAIT/STANCE: She needs push-up to get up from seated position, wide-based, cautious gait   REVIEW OF SYSTEMS: Out of a complete 14 system review of symptoms, the patient complains only of the following symptoms, and all other reviewed systems are negative.  Difficulty sleeping ALLERGIES: Allergies  Allergen Reactions   Dextromethorphan Polistirex Er Other (See Comments)    SOB   Primidone     Severe Headaches   Albuterol     REACTION: tachycardia   Amoxicillin Diarrhea, Nausea Only and Other (See Comments)   Aspirin Other (See Comments)    Adult dose   Atorvastatin Other (See Comments)   Celecoxib Other (See Comments)   Clonazepam Hives and Other (See Comments)   Doxycycline Hyclate Other (See Comments)   Levaquin [Levofloxacin] Other (See Comments)    Per  patient, caused tendonitis.    Loratadine    Nortriptyline     Increase heart rate.   Propranolol Hcl Other (See Comments)    Dizziness, Fatgue   Simvastatin     Myalgias    Ezetimibe Rash    HOME MEDICATIONS: Outpatient Medications Prior to Visit  Medication Sig Dispense Refill   ALPRAZolam (XANAX) 0.25 MG tablet      aspirin 81 MG tablet Take 81 mg by mouth every other day.     brimonidine (ALPHAGAN) 0.2 %  ophthalmic solution Ophthalmic for 37 Days     carboxymethylcellulose (REFRESH PLUS) 0.5 % SOLN Place 1 drop into both eyes daily as needed.     diclofenac Sodium (VOLTAREN) 1 % GEL      fexofenadine (ALLEGRA) 60 MG tablet Take 60 mg by mouth 2 (two) times daily.     fluticasone (FLONASE) 50 MCG/ACT nasal spray SPRAY 2 SPRAYS INTO EACH NOSTRIL EVERY DAY 16 g 11   meclizine (ANTIVERT) 25 MG tablet Take 1 tablet (25 mg total) by mouth 3 (three) times daily as needed for dizziness. 30 tablet 2   Moxifloxacin HCl 0.5 % SOLN      pantoprazole (PROTONIX) 40 MG tablet Take 40 mg by mouth 2 (two) times daily.     polyethylene glycol (MIRALAX / GLYCOLAX) 17 g packet Take 17 g by mouth daily as needed.     primidone (MYSOLINE) 50 MG tablet Take 2 tablets (100 mg total) by mouth in the morning and at bedtime. 180 tablet 0   sodium chloride (MURO 128) 5 % ophthalmic solution      valsartan-hydrochlorothiazide (DIOVAN-HCT) 80-12.5 MG tablet TAKE 1 TABLET BY MOUTH EVERY DAY 90 tablet 3   betamethasone dipropionate (DIPROLENE) 0.05 % cream Apply 1 application  topically 2 (two) times daily.     budesonide-formoterol (BREYNA) 160-4.5 MCG/ACT inhaler Inhale 2 puffs into the lungs in the morning and at bedtime. 1 each 5   fluticasone-salmeterol (ADVAIR) 250-50 MCG/ACT AEPB Inhale 1 puff then rinse mouth, twice daily 60 each 11   loratadine (CLARITIN) 10 MG tablet Take 10 mg by mouth daily.     ondansetron (ZOFRAN) 4 MG tablet Take 4 mg by mouth every 8 (eight) hours as needed for nausea or vomiting.     tiZANidine (ZANAFLEX) 2 MG tablet 1 tab TID as needed for muscle spasms **cash pay if not covered by insurance** 60 tablet 0   Facility-Administered Medications Prior to Visit  Medication Dose Route Frequency Provider Last Rate Last Admin   albuterol (PROVENTIL) (2.5 MG/3ML) 0.083% nebulizer solution 2.5 mg  2.5 mg Nebulization Once Jetty Duhamel D, MD        PAST MEDICAL HISTORY: Past Medical History:   Diagnosis Date   Anemia    Anxiety    Breast cancer (HCC) 2004   right   Cancer [facility])    breast ca   Chest pain    echo 01/08/10- nl lv; mild aortic sclerosis; myoview 01/08/10- no ischemia   Dyslipidemia    myaligia with statins   Esophageal reflux    Hypertension    Neuropathy    Personal history of chemotherapy 2004   Personal history of radiation therapy 2004   Pulmonary sarcoidosis (HCC)    Shingles 03/2016   Tremor    upper extremities   Ventricular tachycardia (paroxysmal) (HCC)    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; AV node reentry tachycardia ablation     PAST SURGICAL HISTORY: Past Surgical History:  Procedure Laterality Date   Boil Right 03/2015   on buttock, excision   BREAST LUMPECTOMY Right 2004   CHOLECYSTECTOMY     left breast lumpectomy,chemo  2004   xrt   SKIN BIOPSY     TUBAL LIGATION     UPPER GI ENDOSCOPY  02/2015   difficulty swallowing, Abd pain    FAMILY HISTORY: Family History  Problem Relation Age of Onset   Heart failure Father 54   Cancer Mother    Heart attack Sister        2 heart attacks   Breast cancer Neg Hx     SOCIAL HISTORY: Social History   Socioeconomic History   Marital status: Divorced    Spouse name: Not on file   Number of children: 2   Years of education: 12   Highest education level: Not on file  Occupational History   Occupation: school Engineer, petroleum  Tobacco Use   Smoking status: Never   Smokeless tobacco: Never  Vaping Use   Vaping status: Never Used  Substance and Sexual Activity   Alcohol use: No   Drug use: No   Sexual activity: Not on file  Other Topics Concern   Not on file  Social History Narrative   Patient lives at home alone.   Right Handed   1 cup caffeine daily   Social Determinants of Health   Financial Resource Strain: Not on file  Food Insecurity: Not on file  Transportation Needs: Not on file  Physical Activity: Not on file  Stress: Not on file  Social Connections: Not on file   Intimate Partner Violence: Not on file    Levert Feinstein, M.D. Ph.D.  [facility] [address] [place], [place] [address] Phone: [phone] Fax:      [phone]

## 2023-07-06 NOTE — Telephone Encounter (Signed)
[facility] is taking this patient 

## 2023-07-08 ENCOUNTER — Telehealth: Payer: Self-pay | Admitting: Neurology

## 2023-07-08 MED ORDER — VITAMIN D (ERGOCALCIFEROL) 1.25 MG (50000 UNIT) PO CAPS: 50000.0000 [IU] | ORAL_CAPSULE | ORAL | 0 refills | Status: DC

## 2023-07-08 NOTE — Telephone Encounter (Signed)
Please call patient, laboratory evaluation showed significantly decreased vitamin D level 10.8, I have prescribed vitamin D supplement, after she finished vitamin D prescription, should continue over-the-counter D3 1000 units daily  Rest of the laboratory evaluation showed no significant abnormalities.

## 2023-07-08 NOTE — Telephone Encounter (Signed)
Pt aware low vit d and rx for it sent and to continue otc vit d 100units after finishing and pt voiced gratitude and understanding

## 2023-07-09 ENCOUNTER — Telehealth: Payer: Self-pay | Admitting: Neurology

## 2023-07-09 DIAGNOSIS — E46 Unspecified protein-calorie malnutrition: Secondary | ICD-10-CM | POA: Diagnosis not present

## 2023-07-09 DIAGNOSIS — Z8701 Personal history of pneumonia (recurrent): Secondary | ICD-10-CM | POA: Diagnosis not present

## 2023-07-09 DIAGNOSIS — J302 Other seasonal allergic rhinitis: Secondary | ICD-10-CM | POA: Diagnosis not present

## 2023-07-09 DIAGNOSIS — J4489 Other specified chronic obstructive pulmonary disease: Secondary | ICD-10-CM | POA: Diagnosis not present

## 2023-07-09 DIAGNOSIS — M5030 Other cervical disc degeneration, unspecified cervical region: Secondary | ICD-10-CM | POA: Diagnosis not present

## 2023-07-09 DIAGNOSIS — G620 Drug-induced polyneuropathy: Secondary | ICD-10-CM | POA: Diagnosis not present

## 2023-07-09 DIAGNOSIS — G243 Spasmodic torticollis: Secondary | ICD-10-CM | POA: Diagnosis not present

## 2023-07-09 DIAGNOSIS — D860 Sarcoidosis of lung: Secondary | ICD-10-CM | POA: Diagnosis not present

## 2023-07-09 DIAGNOSIS — F39 Unspecified mood [affective] disorder: Secondary | ICD-10-CM | POA: Diagnosis not present

## 2023-07-09 NOTE — Telephone Encounter (Signed)
Pt said picked up the medication and read the side effects. Pt stated do not want to take DULoxetine (CYMBALTA) 30 MG capsule, asking if can increase the dosage of primidone (MYSOLINE) 50 MG tablet or prescribe something else. Would like a call back.

## 2023-07-12 NOTE — Telephone Encounter (Signed)
Returned call to pt and she stated that she read the side effects and that she has taken it in the past and caused nausea, HA'S, & dizziness.  I placed cymbalta on intolerance allergy list  Routing to Dr. Terrace Arabia to advise

## 2023-07-13 DIAGNOSIS — E46 Unspecified protein-calorie malnutrition: Secondary | ICD-10-CM | POA: Diagnosis not present

## 2023-07-13 DIAGNOSIS — F39 Unspecified mood [affective] disorder: Secondary | ICD-10-CM | POA: Diagnosis not present

## 2023-07-13 DIAGNOSIS — M5030 Other cervical disc degeneration, unspecified cervical region: Secondary | ICD-10-CM | POA: Diagnosis not present

## 2023-07-13 DIAGNOSIS — J4489 Other specified chronic obstructive pulmonary disease: Secondary | ICD-10-CM | POA: Diagnosis not present

## 2023-07-13 DIAGNOSIS — G620 Drug-induced polyneuropathy: Secondary | ICD-10-CM | POA: Diagnosis not present

## 2023-07-13 DIAGNOSIS — Z8701 Personal history of pneumonia (recurrent): Secondary | ICD-10-CM | POA: Diagnosis not present

## 2023-07-13 DIAGNOSIS — G243 Spasmodic torticollis: Secondary | ICD-10-CM | POA: Diagnosis not present

## 2023-07-13 DIAGNOSIS — J302 Other seasonal allergic rhinitis: Secondary | ICD-10-CM | POA: Diagnosis not present

## 2023-07-13 DIAGNOSIS — D860 Sarcoidosis of lung: Secondary | ICD-10-CM | POA: Diagnosis not present

## 2023-07-16 DIAGNOSIS — Z8701 Personal history of pneumonia (recurrent): Secondary | ICD-10-CM | POA: Diagnosis not present

## 2023-07-16 DIAGNOSIS — F39 Unspecified mood [affective] disorder: Secondary | ICD-10-CM | POA: Diagnosis not present

## 2023-07-16 DIAGNOSIS — J302 Other seasonal allergic rhinitis: Secondary | ICD-10-CM | POA: Diagnosis not present

## 2023-07-16 DIAGNOSIS — D860 Sarcoidosis of lung: Secondary | ICD-10-CM | POA: Diagnosis not present

## 2023-07-16 DIAGNOSIS — J4489 Other specified chronic obstructive pulmonary disease: Secondary | ICD-10-CM | POA: Diagnosis not present

## 2023-07-16 DIAGNOSIS — G620 Drug-induced polyneuropathy: Secondary | ICD-10-CM | POA: Diagnosis not present

## 2023-07-16 DIAGNOSIS — G243 Spasmodic torticollis: Secondary | ICD-10-CM | POA: Diagnosis not present

## 2023-07-16 DIAGNOSIS — M5030 Other cervical disc degeneration, unspecified cervical region: Secondary | ICD-10-CM | POA: Diagnosis not present

## 2023-07-16 DIAGNOSIS — E46 Unspecified protein-calorie malnutrition: Secondary | ICD-10-CM | POA: Diagnosis not present

## 2023-07-21 ENCOUNTER — Ambulatory Visit: Payer: Medicare PPO | Admitting: Podiatry

## 2023-07-21 ENCOUNTER — Encounter: Payer: Self-pay | Admitting: Podiatry

## 2023-07-21 DIAGNOSIS — L84 Corns and callosities: Secondary | ICD-10-CM | POA: Diagnosis not present

## 2023-07-21 DIAGNOSIS — G609 Hereditary and idiopathic neuropathy, unspecified: Secondary | ICD-10-CM

## 2023-07-21 DIAGNOSIS — M79676 Pain in unspecified toe(s): Secondary | ICD-10-CM | POA: Diagnosis not present

## 2023-07-21 DIAGNOSIS — D689 Coagulation defect, unspecified: Secondary | ICD-10-CM

## 2023-07-21 DIAGNOSIS — B351 Tinea unguium: Secondary | ICD-10-CM

## 2023-07-21 DIAGNOSIS — Q828 Other specified congenital malformations of skin: Secondary | ICD-10-CM

## 2023-07-22 DIAGNOSIS — E46 Unspecified protein-calorie malnutrition: Secondary | ICD-10-CM | POA: Diagnosis not present

## 2023-07-22 DIAGNOSIS — D860 Sarcoidosis of lung: Secondary | ICD-10-CM | POA: Diagnosis not present

## 2023-07-22 DIAGNOSIS — J4489 Other specified chronic obstructive pulmonary disease: Secondary | ICD-10-CM | POA: Diagnosis not present

## 2023-07-22 DIAGNOSIS — F39 Unspecified mood [affective] disorder: Secondary | ICD-10-CM | POA: Diagnosis not present

## 2023-07-22 DIAGNOSIS — M5030 Other cervical disc degeneration, unspecified cervical region: Secondary | ICD-10-CM | POA: Diagnosis not present

## 2023-07-22 DIAGNOSIS — J302 Other seasonal allergic rhinitis: Secondary | ICD-10-CM | POA: Diagnosis not present

## 2023-07-22 DIAGNOSIS — Z8701 Personal history of pneumonia (recurrent): Secondary | ICD-10-CM | POA: Diagnosis not present

## 2023-07-22 DIAGNOSIS — G620 Drug-induced polyneuropathy: Secondary | ICD-10-CM | POA: Diagnosis not present

## 2023-07-22 DIAGNOSIS — G243 Spasmodic torticollis: Secondary | ICD-10-CM | POA: Diagnosis not present

## 2023-07-23 ENCOUNTER — Other Ambulatory Visit: Payer: Self-pay | Admitting: Neurology

## 2023-07-23 ENCOUNTER — Other Ambulatory Visit: Payer: Self-pay | Admitting: Cardiovascular Disease

## 2023-07-25 NOTE — Progress Notes (Signed)
  Subjective:  Patient ID: Danielle Peters, female    DOB: [birth year],  MRN: [id number]  [name] presents to clinic today for: at risk foot care with history of peripheral neuropathy and callus(es) of both feet and painful thick toenails that are difficult to trim. Painful toenails interfere with ambulation. Aggravating factors include wearing enclosed shoe gear. Pain is relieved with periodic professional debridement. Painful calluses are aggravated when weightbearing with and without shoegear. Pain is relieved with periodic professional debridement.  Chief Complaint  Patient presents with   RFC     RFC     PCP is Avva, Ravisankar, MD.  Allergies  Allergen Reactions   Dextromethorphan Polistirex Er Other (See Comments)    SOB   Primidone     Severe Headaches   Albuterol     REACTION: tachycardia   Amoxicillin Diarrhea, Nausea Only and Other (See Comments)   Aspirin Other (See Comments)    Adult dose   Atorvastatin Other (See Comments)   Celecoxib Other (See Comments)   Clonazepam Hives and Other (See Comments)   Doxycycline Hyclate Other (See Comments)   Duloxetine Nausea And Vomiting    dizzy   Levaquin [Levofloxacin] Other (See Comments)    Per patient, caused tendonitis.    Loratadine    Nortriptyline     Increase heart rate.   Propranolol Hcl Other (See Comments)    Dizziness, Fatgue   Simvastatin     Myalgias    Ezetimibe Rash    Review of Systems: Negative except as noted in the HPI.  Objective: No changes noted in today's physical examination. There were no vitals filed for this visit.  Danielle Peters is a pleasant 82 y.o. female in NAD. AAO x 3.  Vascular Examination: Capillary refill time <3 seconds b/l LE. Palpable pedal pulses b/l LE. Digital hair absent b/l. No pedal edema b/l. Skin temperature gradient WNL b/l. No varicosities b/l. No ischemia or gangrene noted b/l LE. No cyanosis or clubbing noted b/l  LE.[name]  Dermatological Examination: Pedal skin with normal turgor, texture and tone b/l. No open wounds. No interdigital macerations b/l. Toenails 1-5 b/l thickened, discolored, dystrophic with subungual debris. There is pain on palpation to dorsal aspect of nailplates. Hyperkeratotic lesion(s) bilateral heels and L 5th toe.  No erythema, no edema, no drainage, no fluctuance..  Neurological Examination: Protective sensation intact with 10 gram monofilament b/l LE. Vibratory sensation intact b/l LE. Pt has subjective symptoms of neuropathy.  Musculoskeletal Examination: Muscle strength 5/5 to all lower extremity muscle groups bilaterally. Hammertoe(s) noted to the bilateral 5th toes.  Assessment/Plan: 1. Pain due to onychomycosis of toenail   2. Porokeratosis   3. Corns   4. Clotting disorder (HCC)   5. Hereditary and idiopathic peripheral neuropathy     -Consent given for treatment as described below: -Examined patient. -No new findings. No new orders. -Patient to continue soft, supportive shoe gear daily. -Mycotic toenails 1-5 bilaterally were debrided in length and girth with sterile nail nippers and dremel without incident. -Corn(s) L 5th toe pared utilizing sharp debridement with sterile blade without complication or incident. Total number debrided=1. -Callus(es) bilateral heels pared utilizing sterile scalpel blade without complication or incident. Total number debrided =2. -Patient/POA to call should there be question/concern in the interim.   Return in about 3 months (around 10/21/2023).  Freddie Breech, DPM

## 2023-07-26 ENCOUNTER — Telehealth: Payer: Self-pay | Admitting: Internal Medicine

## 2023-07-26 NOTE — Telephone Encounter (Signed)
PT states Symbicort is too expensive can Dr. Malena Peer and alternative. Please see 9/6 signed encounter where Dr. Atha Starks alternative Symbicort due to a medication reaction.  Her # is .[phone]

## 2023-07-27 ENCOUNTER — Other Ambulatory Visit (HOSPITAL_COMMUNITY): Payer: Self-pay

## 2023-07-27 ENCOUNTER — Other Ambulatory Visit: Payer: Self-pay | Admitting: Internal Medicine

## 2023-07-27 DIAGNOSIS — J4489 Other specified chronic obstructive pulmonary disease: Secondary | ICD-10-CM | POA: Diagnosis not present

## 2023-07-27 DIAGNOSIS — E46 Unspecified protein-calorie malnutrition: Secondary | ICD-10-CM | POA: Diagnosis not present

## 2023-07-27 DIAGNOSIS — M5030 Other cervical disc degeneration, unspecified cervical region: Secondary | ICD-10-CM | POA: Diagnosis not present

## 2023-07-27 DIAGNOSIS — J302 Other seasonal allergic rhinitis: Secondary | ICD-10-CM | POA: Diagnosis not present

## 2023-07-27 DIAGNOSIS — G620 Drug-induced polyneuropathy: Secondary | ICD-10-CM | POA: Diagnosis not present

## 2023-07-27 DIAGNOSIS — Z8701 Personal history of pneumonia (recurrent): Secondary | ICD-10-CM | POA: Diagnosis not present

## 2023-07-27 DIAGNOSIS — F39 Unspecified mood [affective] disorder: Secondary | ICD-10-CM | POA: Diagnosis not present

## 2023-07-27 DIAGNOSIS — G243 Spasmodic torticollis: Secondary | ICD-10-CM | POA: Diagnosis not present

## 2023-07-27 DIAGNOSIS — D860 Sarcoidosis of lung: Secondary | ICD-10-CM | POA: Diagnosis not present

## 2023-07-27 MED ORDER — FLUTICASONE-SALMETEROL 115-21 MCG/ACT IN AERO: INHALATION_SPRAY | RESPIRATORY_TRACT | 12 refills | Status: DC

## 2023-07-27 NOTE — Telephone Encounter (Signed)
Per test claims Advair HFA is covered at a $40.00/30 day co-pay or $80.00/90 days

## 2023-07-27 NOTE — Telephone Encounter (Signed)
Script sent changing from Symbicort to Advair hfa, which should be cheaper

## 2023-07-27 NOTE — Telephone Encounter (Signed)
Spoke to patient. She stated that Symbicort is not covered.   PA team, please do test claim for alternatives. Thanks

## 2023-07-28 NOTE — Telephone Encounter (Signed)
Pt made aware Symbicort changed to Advair. Nothing further needed.

## 2023-07-28 NOTE — Telephone Encounter (Signed)
nfn

## 2023-07-29 ENCOUNTER — Other Ambulatory Visit: Payer: Self-pay | Admitting: Neurology

## 2023-07-29 DIAGNOSIS — H903 Sensorineural hearing loss, bilateral: Secondary | ICD-10-CM | POA: Diagnosis not present

## 2023-08-04 DIAGNOSIS — D860 Sarcoidosis of lung: Secondary | ICD-10-CM | POA: Diagnosis not present

## 2023-08-04 DIAGNOSIS — J302 Other seasonal allergic rhinitis: Secondary | ICD-10-CM | POA: Diagnosis not present

## 2023-08-04 DIAGNOSIS — F39 Unspecified mood [affective] disorder: Secondary | ICD-10-CM | POA: Diagnosis not present

## 2023-08-04 DIAGNOSIS — Z8701 Personal history of pneumonia (recurrent): Secondary | ICD-10-CM | POA: Diagnosis not present

## 2023-08-04 DIAGNOSIS — J4489 Other specified chronic obstructive pulmonary disease: Secondary | ICD-10-CM | POA: Diagnosis not present

## 2023-08-04 DIAGNOSIS — M5030 Other cervical disc degeneration, unspecified cervical region: Secondary | ICD-10-CM | POA: Diagnosis not present

## 2023-08-04 DIAGNOSIS — G620 Drug-induced polyneuropathy: Secondary | ICD-10-CM | POA: Diagnosis not present

## 2023-08-04 DIAGNOSIS — E46 Unspecified protein-calorie malnutrition: Secondary | ICD-10-CM | POA: Diagnosis not present

## 2023-08-04 DIAGNOSIS — G243 Spasmodic torticollis: Secondary | ICD-10-CM | POA: Diagnosis not present

## 2023-08-04 NOTE — Telephone Encounter (Signed)
Plan of care faxed to [facility]

## 2023-08-08 DIAGNOSIS — J4489 Other specified chronic obstructive pulmonary disease: Secondary | ICD-10-CM | POA: Diagnosis not present

## 2023-08-08 DIAGNOSIS — J302 Other seasonal allergic rhinitis: Secondary | ICD-10-CM | POA: Diagnosis not present

## 2023-08-08 DIAGNOSIS — Z8701 Personal history of pneumonia (recurrent): Secondary | ICD-10-CM | POA: Diagnosis not present

## 2023-08-08 DIAGNOSIS — E46 Unspecified protein-calorie malnutrition: Secondary | ICD-10-CM | POA: Diagnosis not present

## 2023-08-08 DIAGNOSIS — M5030 Other cervical disc degeneration, unspecified cervical region: Secondary | ICD-10-CM | POA: Diagnosis not present

## 2023-08-08 DIAGNOSIS — G243 Spasmodic torticollis: Secondary | ICD-10-CM | POA: Diagnosis not present

## 2023-08-08 DIAGNOSIS — F39 Unspecified mood [affective] disorder: Secondary | ICD-10-CM | POA: Diagnosis not present

## 2023-08-08 DIAGNOSIS — D860 Sarcoidosis of lung: Secondary | ICD-10-CM | POA: Diagnosis not present

## 2023-08-08 DIAGNOSIS — G620 Drug-induced polyneuropathy: Secondary | ICD-10-CM | POA: Diagnosis not present

## 2023-08-09 DIAGNOSIS — G243 Spasmodic torticollis: Secondary | ICD-10-CM | POA: Diagnosis not present

## 2023-08-09 DIAGNOSIS — Z8701 Personal history of pneumonia (recurrent): Secondary | ICD-10-CM | POA: Diagnosis not present

## 2023-08-09 DIAGNOSIS — J4489 Other specified chronic obstructive pulmonary disease: Secondary | ICD-10-CM | POA: Diagnosis not present

## 2023-08-09 DIAGNOSIS — E46 Unspecified protein-calorie malnutrition: Secondary | ICD-10-CM | POA: Diagnosis not present

## 2023-08-09 DIAGNOSIS — G620 Drug-induced polyneuropathy: Secondary | ICD-10-CM | POA: Diagnosis not present

## 2023-08-09 DIAGNOSIS — M5030 Other cervical disc degeneration, unspecified cervical region: Secondary | ICD-10-CM | POA: Diagnosis not present

## 2023-08-09 DIAGNOSIS — J302 Other seasonal allergic rhinitis: Secondary | ICD-10-CM | POA: Diagnosis not present

## 2023-08-09 DIAGNOSIS — D860 Sarcoidosis of lung: Secondary | ICD-10-CM | POA: Diagnosis not present

## 2023-08-09 DIAGNOSIS — F39 Unspecified mood [affective] disorder: Secondary | ICD-10-CM | POA: Diagnosis not present

## 2023-08-12 ENCOUNTER — Other Ambulatory Visit: Payer: Self-pay | Admitting: Cardiovascular Disease

## 2023-08-12 ENCOUNTER — Telehealth: Payer: Self-pay | Admitting: Internal Medicine

## 2023-08-12 MED ORDER — SPIRIVA RESPIMAT 2.5 MCG/ACT IN AERS: 2.0000 | INHALATION_SPRAY | Freq: Every day | RESPIRATORY_TRACT | 5 refills | Status: DC

## 2023-08-12 NOTE — Telephone Encounter (Signed)
Patient is calling because inhaler (Advir) is starting to make her heart race and have high blood pressure. Please call and advise.

## 2023-08-12 NOTE — Telephone Encounter (Signed)
Best to dc Advair and send Spiriva 2.5, # 1   inhale 2 puffs daily, ref x 5 If we have samples, she could come by and get # 2 to try before buying.

## 2023-08-12 NOTE — Telephone Encounter (Signed)
Called and spoke to patient.  She reports of rapid heart race, jittery and high BP. She feels that this is related to Advair.  She stated that she started Advair last week and sx developed after 2 days. Denied SOB, f/s/c or additional sx.  Current Outpatient Medications on File Prior to Visit  Medication Sig Dispense Refill   ALPRAZolam (XANAX) 0.25 MG tablet      aspirin 81 MG tablet Take 81 mg by mouth every other day.     brimonidine (ALPHAGAN) 0.2 % ophthalmic solution Ophthalmic for 37 Days     carboxymethylcellulose (REFRESH PLUS) 0.5 % SOLN Place 1 drop into both eyes daily as needed.     diclofenac Sodium (VOLTAREN) 1 % GEL      DULoxetine (CYMBALTA) 30 MG capsule TAKE 1 CAPSULE BY MOUTH EVERY DAY 90 capsule 4   fexofenadine (ALLEGRA) 60 MG tablet Take 60 mg by mouth 2 (two) times daily.     fluticasone (FLONASE) 50 MCG/ACT nasal spray SPRAY 2 SPRAYS INTO EACH NOSTRIL EVERY DAY 16 g 11   fluticasone-salmeterol (ADVAIR HFA) 115-21 MCG/ACT inhaler Inhale 2 puffs then rinse mouth, twice daily 1 each 12   meclizine (ANTIVERT) 25 MG tablet Take 1 tablet (25 mg total) by mouth 3 (three) times daily as needed for dizziness. 30 tablet 2   Moxifloxacin HCl 0.5 % SOLN      pantoprazole (PROTONIX) 40 MG tablet Take 40 mg by mouth 2 (two) times daily.     polyethylene glycol (MIRALAX / GLYCOLAX) 17 g packet Take 17 g by mouth daily as needed.     primidone (MYSOLINE) 50 MG tablet Take 2 tablets (100 mg total) by mouth in the morning and at bedtime. 180 tablet 0   sodium chloride (MURO 128) 5 % ophthalmic solution      valsartan-hydrochlorothiazide (DIOVAN-HCT) 80-12.5 MG tablet TAKE 1 TABLET BY MOUTH EVERY DAY 90 tablet 3   Vitamin D, Ergocalciferol, (DRISDOL) 1.25 MG (50000 UNIT) CAPS capsule TAKE 1 CAPSULE (50,000 UNITS TOTAL) BY MOUTH EVERY 7 (SEVEN) DAYS 5 capsule 0   Current Facility-Administered Medications on File Prior to Visit  Medication Dose Route Frequency Provider Last Rate Last  Admin   albuterol (PROVENTIL) (2.5 MG/3ML) 0.083% nebulizer solution 2.5 mg  2.5 mg Nebulization Once Young, Clinton D, MD        Allergies  Allergen Reactions   Dextromethorphan Polistirex Er Other (See Comments)    SOB   Primidone     Severe Headaches   Albuterol     REACTION: tachycardia   Amoxicillin Diarrhea, Nausea Only and Other (See Comments)   Aspirin Other (See Comments)    Adult dose   Atorvastatin Other (See Comments)   Celecoxib Other (See Comments)   Clonazepam Hives and Other (See Comments)   Doxycycline Hyclate Other (See Comments)   Duloxetine Nausea And Vomiting    dizzy   Levaquin [Levofloxacin] Other (See Comments)    Per patient, caused tendonitis.    Loratadine    Nortriptyline     Increase heart rate.   Propranolol Hcl Other (See Comments)    Dizziness, Fatgue   Simvastatin     Myalgias    Ezetimibe Rash

## 2023-08-12 NOTE — Telephone Encounter (Signed)
Patient is aware of recommendations and voiced her understanding.  Spiriva sent to preferred pharmacy. Nothing further needed.

## 2023-08-13 ENCOUNTER — Telehealth: Payer: Self-pay | Admitting: Neurology

## 2023-08-13 NOTE — Telephone Encounter (Signed)
Pt said [facility] only refilled primidone (MYSOLINE) 50 MG tablet for 30 days until able to see Dr. Terrace Arabia . [facility] [phone]

## 2023-08-13 NOTE — Telephone Encounter (Signed)
Defer to PCP or Neurology

## 2023-08-16 MED ORDER — PRIMIDONE 50 MG PO TABS: 100.0000 mg | ORAL_TABLET | Freq: Two times a day (BID) | ORAL | 0 refills | Status: DC

## 2023-08-16 NOTE — Telephone Encounter (Signed)
Phone room: Let pt know that this has been refilled.  Thanks,  Production assistant, radio

## 2023-08-16 NOTE — Telephone Encounter (Signed)
Called and informed pt. Pt verbalized appreciation.

## 2023-08-17 DIAGNOSIS — M5030 Other cervical disc degeneration, unspecified cervical region: Secondary | ICD-10-CM | POA: Diagnosis not present

## 2023-08-17 DIAGNOSIS — J4489 Other specified chronic obstructive pulmonary disease: Secondary | ICD-10-CM | POA: Diagnosis not present

## 2023-08-17 DIAGNOSIS — G243 Spasmodic torticollis: Secondary | ICD-10-CM | POA: Diagnosis not present

## 2023-08-17 DIAGNOSIS — G620 Drug-induced polyneuropathy: Secondary | ICD-10-CM | POA: Diagnosis not present

## 2023-08-17 DIAGNOSIS — J302 Other seasonal allergic rhinitis: Secondary | ICD-10-CM | POA: Diagnosis not present

## 2023-08-17 DIAGNOSIS — D860 Sarcoidosis of lung: Secondary | ICD-10-CM | POA: Diagnosis not present

## 2023-08-17 DIAGNOSIS — E46 Unspecified protein-calorie malnutrition: Secondary | ICD-10-CM | POA: Diagnosis not present

## 2023-08-17 DIAGNOSIS — F39 Unspecified mood [affective] disorder: Secondary | ICD-10-CM | POA: Diagnosis not present

## 2023-08-17 DIAGNOSIS — Z8701 Personal history of pneumonia (recurrent): Secondary | ICD-10-CM | POA: Diagnosis not present

## 2023-08-24 ENCOUNTER — Other Ambulatory Visit: Payer: Self-pay | Admitting: Internal Medicine

## 2023-08-24 DIAGNOSIS — Z1231 Encounter for screening mammogram for malignant neoplasm of breast: Secondary | ICD-10-CM

## 2023-08-25 DIAGNOSIS — M5030 Other cervical disc degeneration, unspecified cervical region: Secondary | ICD-10-CM | POA: Diagnosis not present

## 2023-08-25 DIAGNOSIS — D860 Sarcoidosis of lung: Secondary | ICD-10-CM | POA: Diagnosis not present

## 2023-08-25 DIAGNOSIS — G243 Spasmodic torticollis: Secondary | ICD-10-CM | POA: Diagnosis not present

## 2023-08-25 DIAGNOSIS — G620 Drug-induced polyneuropathy: Secondary | ICD-10-CM | POA: Diagnosis not present

## 2023-08-25 DIAGNOSIS — J302 Other seasonal allergic rhinitis: Secondary | ICD-10-CM | POA: Diagnosis not present

## 2023-08-25 DIAGNOSIS — F39 Unspecified mood [affective] disorder: Secondary | ICD-10-CM | POA: Diagnosis not present

## 2023-08-25 DIAGNOSIS — E46 Unspecified protein-calorie malnutrition: Secondary | ICD-10-CM | POA: Diagnosis not present

## 2023-08-25 DIAGNOSIS — Z8701 Personal history of pneumonia (recurrent): Secondary | ICD-10-CM | POA: Diagnosis not present

## 2023-08-25 DIAGNOSIS — J4489 Other specified chronic obstructive pulmonary disease: Secondary | ICD-10-CM | POA: Diagnosis not present

## 2023-08-26 NOTE — Progress Notes (Signed)
HPI  Female never smoker followed for sarcoid, chronic bronchitis complicated by history of right breast cancer/ XRT, tachycardia, GERD, anxiety, glaucoma, tremor.[name]  PCP Dr Irving Burton Med Cardiac Study 11/25/16- EF 77%, hyperdynamic, otw  WNL Office Spirometry 12/31/2016-mild restriction of exhaled volume. FVC 1.61/77%, FEV1 1.37/86%, ratio 0.85, FEF 25-75% 2.00/ 144% -----------------------------------------------------------------------------------------.   05/21/23- 82 year old female never smoker followed for Sarcoid, Chronic Bronchitis, accompanied by hx Right Breast CA/XRT, Tachycardia, GERD, anxiety, Glaucoma, Tremor, HTN,  -Breo 100, Spiriva Respimat, Flonase, Claritin, ED 7/17 for atypical chest pain, bilateral x 1 month, incr with movement, reproduced on exam. -----Breathing has not the been the best Comes with an aide. Several somatic complaints. Pain at right scapula. Note recent eval for various anterior chest pains. Eyes water- her eye doctor told her she is ok. Insurance won't cover Breo- we will change to Symbicort.  No recent significant increased wheeze or cough, but she asks to try a nebulizer treatment here CXR 05/05/23-  IMPRESSION: Right basilar linear atelectasis/scarring. No acute cardiopulmonary process.  08/27/23- 81 year old female never smoker followed for Sarcoid, Chronic Bronchitis, accompanied by hx Right Breast CA/XRT, Tachycardia, GERD, anxiety, Glaucoma, Tremor, HTN,   Spiriva Respimat, Flonase, Claritin, Neb Albuterol,  Breo then Advair changed to Spiriva due to tachycardia. -----Breathing is good  -Declines flu vax "always make me sick" She had stopped using Advair/Wixela because of tachypalpitations, without noticing a lot of difference in her breathing.  I thought she already had a trial of Spiriva but she does not recognize the name.  I will give her samples today to see if a non-LABA bronchodilator helps her.  Much of her problem is old fixed scarring. She  asked if I would order update mammogram, saying her last one was 2 years ago.  Oncology no longer follows her after remote right breast cancer.  She has not noticed anything different.  ROS- see HPI  + = positive Constitutional:   No-   weight loss, night sweats, fevers, chills,+ fatigue, lassitude. HEENT:   No-  headaches, difficulty swallowing, tooth/dental problems, sore throat,       No-  sneezing, itching, ear ache, nasal congestion, +post nasal drip,  CV:   chest pain, orthopnea, PND, swelling in lower extremities, anasarca, dizziness, palpitations Resp: No-   shortness of breath with exertion or at rest.              productive cough,  + non-productive cough,  No-  coughing up of blood.              No-   change in color of mucus.  +wheezing.   Skin: +HPI GI:  No-   heartburn, indigestion, abdominal pain, nausea, vomiting,  GU:. MS:  No-   joint pain or swelling. + pain across mid thoracic back around to sides. 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 No depression or anxiety.  No memory loss.  Obj General- Alert, Oriented, Affect-appropriate, Distress- none acute,                     + Always slumped, almost kyphotic posture Skin- + hyperpigmentation  Several isolated spots Lymphadenopathy- none Head- atraumatic            Eyes- Gross vision intact, PERRLA, conjunctivae clear secretions            Ears- Hearing, canals-normal            Nose- Clear, no-Septal  dev, mucus, polyps, erosion, perforation             Throat- Mallampati II , mucosa clear , drainage- none, tonsils- atrophic. + Dentures Neck- flexible , trachea midline, no stridor , thyroid nl, carotid no bruit Chest - symmetrical excursion , unlabored           Heart/CV- RRR with skipped beats , no murmur , no gallop  , no rub, nl s1 s2                - JVD- none , edema- none, stasis changes- none, varices- none           Lung- + inspiratory squeaks R>L ,  Work of  breathing is not increased.   cough- none , dullness-none, rub- none           Chest wall- +treatment Scar R breast.  Abd- No HSM Br/ Gen/ Rectal- Not done, not indicated Extrem- cyanosis- none, clubbing, none, atrophy- none, strength- nl Neuro- + significant tremor hands

## 2023-08-27 ENCOUNTER — Encounter: Payer: Self-pay | Admitting: Internal Medicine

## 2023-08-27 ENCOUNTER — Ambulatory Visit: Payer: Medicare PPO | Admitting: Internal Medicine

## 2023-08-27 VITALS — BP 112/58 | HR 103 | Ht 63.5 in | Wt 137.0 lb

## 2023-08-27 DIAGNOSIS — Z853 Personal history of malignant neoplasm of breast: Secondary | ICD-10-CM | POA: Diagnosis not present

## 2023-08-27 DIAGNOSIS — J410 Simple chronic bronchitis: Secondary | ICD-10-CM

## 2023-08-27 DIAGNOSIS — N6459 Other signs and symptoms in breast: Secondary | ICD-10-CM | POA: Diagnosis not present

## 2023-08-27 DIAGNOSIS — D860 Sarcoidosis of lung: Secondary | ICD-10-CM | POA: Diagnosis not present

## 2023-08-27 NOTE — Patient Instructions (Signed)
Order- sample x 2 Spiriva 1.25    inhale 2 puffs once daily.  See if this helps your breathing. If it does, let us know and we can send refill prescription.  Order- schedule routine mamogram at [facility]  fax [phone]    Hx R breast cancer

## 2023-08-27 NOTE — Assessment & Plan Note (Signed)
She denies noting any changes but asks we update mammogram for her reassurance. Plan-mammogram ordered

## 2023-08-27 NOTE — Assessment & Plan Note (Signed)
Old scarring causes most of the squeaks that we hear listening to her chest.

## 2023-08-31 DIAGNOSIS — E46 Unspecified protein-calorie malnutrition: Secondary | ICD-10-CM | POA: Diagnosis not present

## 2023-08-31 DIAGNOSIS — Z8701 Personal history of pneumonia (recurrent): Secondary | ICD-10-CM | POA: Diagnosis not present

## 2023-08-31 DIAGNOSIS — J4489 Other specified chronic obstructive pulmonary disease: Secondary | ICD-10-CM | POA: Diagnosis not present

## 2023-08-31 DIAGNOSIS — F39 Unspecified mood [affective] disorder: Secondary | ICD-10-CM | POA: Diagnosis not present

## 2023-08-31 DIAGNOSIS — M5030 Other cervical disc degeneration, unspecified cervical region: Secondary | ICD-10-CM | POA: Diagnosis not present

## 2023-08-31 DIAGNOSIS — G243 Spasmodic torticollis: Secondary | ICD-10-CM | POA: Diagnosis not present

## 2023-08-31 DIAGNOSIS — G620 Drug-induced polyneuropathy: Secondary | ICD-10-CM | POA: Diagnosis not present

## 2023-08-31 DIAGNOSIS — J302 Other seasonal allergic rhinitis: Secondary | ICD-10-CM | POA: Diagnosis not present

## 2023-08-31 DIAGNOSIS — D860 Sarcoidosis of lung: Secondary | ICD-10-CM | POA: Diagnosis not present

## 2023-09-02 ENCOUNTER — Telehealth: Payer: Self-pay | Admitting: Internal Medicine

## 2023-09-02 DIAGNOSIS — Z853 Personal history of malignant neoplasm of breast: Secondary | ICD-10-CM

## 2023-09-02 NOTE — Telephone Encounter (Signed)
Atc pt, no answer lvmm for pt to give [place] a call back. Mammogram was placed 11/8, office tried to reach out and left a vm.

## 2023-09-02 NOTE — Telephone Encounter (Signed)
Patient states needs order for diagnostic mammogram. [facility] Patient phone number is [phone].

## 2023-09-02 NOTE — Telephone Encounter (Signed)
Order has been placed.

## 2023-09-02 NOTE — Telephone Encounter (Signed)
Danielle Peters- please change order to Diagnostic Mammogram, as per patient message. Dx would be Breast Cancer R breast- history of, or in remission, or something similar.

## 2023-09-02 NOTE — Telephone Encounter (Signed)
Patient states order needs to be changed to diagnostic mammogram. Patient phone number is [phone].

## 2023-09-02 NOTE — Telephone Encounter (Addendum)
PCC's, please advise. Do you have order code for order that is needed?  Dr. Maple Hudson, please advise if okay to order Diagnostic mammogram vs screening that was ordered. Thanks

## 2023-09-14 ENCOUNTER — Ambulatory Visit: Payer: Medicare PPO | Admitting: Cardiovascular Disease

## 2023-10-08 ENCOUNTER — Ambulatory Visit: Payer: Medicare PPO

## 2023-10-18 ENCOUNTER — Other Ambulatory Visit: Payer: Self-pay | Admitting: Internal Medicine

## 2023-10-29 DIAGNOSIS — Z947 Corneal transplant status: Secondary | ICD-10-CM | POA: Diagnosis not present

## 2023-10-29 DIAGNOSIS — H4051X3 Glaucoma secondary to other eye disorders, right eye, severe stage: Secondary | ICD-10-CM | POA: Diagnosis not present

## 2023-10-29 DIAGNOSIS — Z961 Presence of intraocular lens: Secondary | ICD-10-CM | POA: Diagnosis not present

## 2023-11-01 ENCOUNTER — Other Ambulatory Visit: Payer: Self-pay | Admitting: Neurology

## 2023-11-01 MED ORDER — PRIMIDONE 50 MG PO TABS: 100.0000 mg | ORAL_TABLET | Freq: Two times a day (BID) | ORAL | 0 refills | Status: DC

## 2023-11-01 NOTE — Telephone Encounter (Addendum)
 Pt called stating that she is needing a refill on her primidone  (MYSOLINE ) 50 MG tablet and [facility] [address] Pt also stated that when she called they informed her that the Vitamin D  is not in stock on the shelf and she is wanting to know if an Rx can be sent in for her. Please advise.

## 2023-11-02 ENCOUNTER — Ambulatory Visit
Admission: RE | Admit: 2023-11-02 | Discharge: 2023-11-02 | Disposition: A | Discharge status: Home / Self Care | Payer: Medicare PPO | Source: Ambulatory Visit | Attending: Internal Medicine | Admitting: Internal Medicine

## 2023-11-02 DIAGNOSIS — Z1231 Encounter for screening mammogram for malignant neoplasm of breast: Secondary | ICD-10-CM | POA: Diagnosis not present

## 2023-11-02 DIAGNOSIS — Z853 Personal history of malignant neoplasm of breast: Secondary | ICD-10-CM

## 2023-11-03 ENCOUNTER — Ambulatory Visit: Payer: Medicare PPO | Admitting: Podiatry

## 2023-11-03 ENCOUNTER — Encounter: Payer: Self-pay | Admitting: Podiatry

## 2023-11-03 VITALS — Ht 63.5 in | Wt 137.0 lb

## 2023-11-03 DIAGNOSIS — M79676 Pain in unspecified toe(s): Secondary | ICD-10-CM | POA: Diagnosis not present

## 2023-11-03 DIAGNOSIS — Q828 Other specified congenital malformations of skin: Secondary | ICD-10-CM | POA: Diagnosis not present

## 2023-11-03 DIAGNOSIS — B351 Tinea unguium: Secondary | ICD-10-CM

## 2023-11-03 DIAGNOSIS — D689 Coagulation defect, unspecified: Secondary | ICD-10-CM | POA: Diagnosis not present

## 2023-11-03 DIAGNOSIS — L84 Corns and callosities: Secondary | ICD-10-CM

## 2023-11-05 ENCOUNTER — Other Ambulatory Visit: Payer: Self-pay | Admitting: Neurology

## 2023-11-11 ENCOUNTER — Encounter: Payer: Self-pay | Admitting: Podiatry

## 2023-11-11 NOTE — Progress Notes (Signed)
Subjective:  Patient ID: Danielle Peters, female    DOB: 01/22/1941,  MRN: [id number]  83 y.o. female presents to clinic with  at risk foot care with h/o clotting disorder and painful porokeratotic lesion(s) of both feet and painful mycotic toenails that limit ambulation. Painful toenails interfere with ambulation. Aggravating factors include wearing enclosed shoe gear. Pain is relieved with periodic professional debridement. Painful porokeratotic lesions are aggravated when weightbearing with and without shoegear. Pain is relieved with periodic professional debridement.  Chief Complaint  Patient presents with   RFc    She is here for routine foot care for nail trim and callouses, PCP Dr Felipa Eth, Last OV is 07/2023   New problem(s): None   PCP is Avva, Ravisankar, MD.  Allergies  Allergen Reactions   Dextromethorphan Polistirex Er Other (See Comments)    SOB   Primidone     Severe Headaches   Albuterol     REACTION: tachycardia   Amoxicillin Diarrhea, Nausea Only and Other (See Comments)   Aspirin Other (See Comments)    Adult dose   Atorvastatin Other (See Comments)   Celecoxib Other (See Comments)   Clonazepam Hives and Other (See Comments)   Doxycycline Hyclate Other (See Comments)   Duloxetine Nausea And Vomiting    dizzy   Levaquin [Levofloxacin] Other (See Comments)    Per patient, caused tendonitis.    Loratadine    Nortriptyline     Increase heart rate.   Propranolol Hcl Other (See Comments)    Dizziness, Fatgue   Simvastatin     Myalgias    Ezetimibe Rash    Review of Systems: Negative except as noted in the HPI.   Objective:  Danielle Peters is a pleasant 83 y.o. female WD, WN in NAD. AAO x 3.  Vascular Examination: Vascular status intact b/l with palpable pedal pulses. CFT immediate b/l. No edema. No pain with calf compression b/l. Skin temperature gradient WNL b/l. No ischemia or gangrene noted b/l LE. No cyanosis or clubbing noted b/l  LE.  Neurological Examination: Pt has subjective symptoms of neuropathy.Sensation grossly intact b/l with 10 gram monofilament. Vibratory sensation intact b/l.   Dermatological Examination: Pedal skin with normal turgor, texture and tone b/l. Toenails 1-5 b/l thick, discolored, elongated with subungual debris and pain on dorsal palpation.Hyperkeratotic lesion(s) L 5th toe.  No erythema, no edema, no drainage, no fluctuance. Porokeratotic lesion(s) bilateral heels. No erythema, no edema, no drainage, no fluctuance.  Musculoskeletal Examination: Muscle strength 5/5 to b/l LE. Hammertoe(s) noted to the L 5th toe and R 5th toe.  Radiographs: None  Assessment:   1. Pain due to onychomycosis of toenail   2. Porokeratosis   3. Corns   4. Clotting disorder (HCC)    Plan:  -Patient was evaluated today. All questions/concerns addressed on today's visit. -Continue supportive shoe gear daily. -Toenails 1-5 b/l were debrided in length and girth with sterile nail nippers and dremel without iatrogenic bleeding.  -Corn(s) L 5th toe pared utilizing sterile scalpel blade without complication or incident. Total number debrided=1. -Porokeratotic lesion(s) bilateral heels pared and enucleated with sterile currette without incident. Total number of lesions debrided=2. -Patient/POA to call should there be question/concern in the interim.  No follow-ups on file.  Freddie Breech, DPM      [facility] LOCATION: [address]  [place], [place] [address]                   Office [phone]   [facility] LOCATION: [address] [place], [place] [address] Office [phone]

## 2023-11-12 NOTE — Progress Notes (Signed)
Results have been relayed to the patient/authorized caretaker. The patient/authorized caretaker verbalized understanding. No questions at this time.

## 2023-12-01 ENCOUNTER — Telehealth: Payer: Self-pay | Admitting: Cardiovascular Disease

## 2023-12-01 NOTE — Telephone Encounter (Signed)
Patient identification verified by 2 forms. Danielle Rail, RN    Called and spoke to patient  Patient states:   -BP has been skipping all over the place   -this morning BP 132/59 before medication   -takes Diovan-hydrochlorothiazide 80-12.5  -she held morning BP medication   -was advised by Dr. Royann Shivers DBP needs to be >60   -all of her medications make her feel dizzy at time   -2/11: 114/57 before medication  -2/5: 147/79 before medication   -2/7: 132/74 before medication  Informed patient message sent to Dr. Royann Shivers regarding BP parameter  Patient verbalized understanding, no questions at this time

## 2023-12-01 NOTE — Telephone Encounter (Signed)
Pt c/o BP issue: STAT if pt c/o blurred vision, one-sided weakness or slurred speech  1. What are your last 5 BP readings? 132/59  2. Are you having any other symptoms (ex. Dizziness, headache, blurred vision, passed out)? Lightheadedness and dizziness  3. What is your BP issue? Pt is very concerned and not feeling well since changes in her BP. Please advise .

## 2023-12-01 NOTE — Telephone Encounter (Signed)
Let's cut back the valsartan further to 40 mg daily but this will require separate prescriptions for the valsartan 40 mg daily and hydrochlorothiazide 12.5 mg daily. Thanks

## 2023-12-02 MED ORDER — VALSARTAN 40 MG PO TABS: 40.0000 mg | ORAL_TABLET | Freq: Every day | ORAL | 3 refills | Status: AC

## 2023-12-02 MED ORDER — HYDROCHLOROTHIAZIDE 12.5 MG PO CAPS: 12.5000 mg | ORAL_CAPSULE | Freq: Every day | ORAL | 3 refills | Status: AC

## 2023-12-02 NOTE — Telephone Encounter (Signed)
Called pt, relied Dr. Erin Hearing message. Pt verbalized understanding. New prescriptions sent to preferred pharmacy.

## 2023-12-02 NOTE — Telephone Encounter (Signed)
Pt called in about what she needs to about her bp.

## 2023-12-03 ENCOUNTER — Emergency Department (HOSPITAL_COMMUNITY)
Admission: EM | Admit: 2023-12-03 | Discharge: 2023-12-03 | Discharge status: Against Medical Advice | Payer: Medicare PPO | Attending: Emergency Medicine | Admitting: Emergency Medicine

## 2023-12-03 ENCOUNTER — Other Ambulatory Visit: Payer: Self-pay

## 2023-12-03 ENCOUNTER — Ambulatory Visit (HOSPITAL_COMMUNITY)
Admission: EM | Admit: 2023-12-03 | Discharge: 2023-12-03 | Disposition: A | Discharge status: Home / Self Care | Payer: Medicare PPO | Attending: Family Medicine | Admitting: Family Medicine

## 2023-12-03 ENCOUNTER — Encounter (HOSPITAL_COMMUNITY): Payer: Self-pay

## 2023-12-03 DIAGNOSIS — M79602 Pain in left arm: Secondary | ICD-10-CM | POA: Diagnosis not present

## 2023-12-03 DIAGNOSIS — M79601 Pain in right arm: Secondary | ICD-10-CM | POA: Insufficient documentation

## 2023-12-03 DIAGNOSIS — R42 Dizziness and giddiness: Secondary | ICD-10-CM | POA: Insufficient documentation

## 2023-12-03 DIAGNOSIS — R000 Tachycardia, unspecified: Secondary | ICD-10-CM

## 2023-12-03 DIAGNOSIS — Z5321 Procedure and treatment not carried out due to patient leaving prior to being seen by health care provider: Secondary | ICD-10-CM | POA: Insufficient documentation

## 2023-12-03 NOTE — ED Triage Notes (Signed)
Pt coming in for a HR of 101 this morning. Pt states she has headaches for a month and dizziness for a long time long time. Her caregiver states this morning 143/66.   Interventions: Ibuprofen, Tylenol

## 2023-12-03 NOTE — ED Notes (Signed)
Provider triaged and sent to the ED therefore I did not finish triage questions.

## 2023-12-03 NOTE — ED Provider Notes (Signed)
Patient seen briefly in triage.  She comes in today for feeling that her heart is racing and she is felt dizzy when she stands up or bends over.  Apparently she has had this symptom happen intermittently and had been evaluated by cardiology.  She states that she came in today because of the dizziness and she lives alone.  No chest pain and no shortness of breath.  This has been lasting at least a couple of hours today.  Heart rate is in the 110s and EKG shows the sinus tachycardia.  Is difficult to assess the ST segments due to the tachycardia.  Blood pressure is 148/63 and heart rate is 115 on the monitor.  I have asked them to proceed to the emergency room directly for urgent evaluation.  They will go by private car.   Zenia Resides, MD 12/03/23 [phone]

## 2023-12-03 NOTE — ED Notes (Signed)
Patient is being discharged from the Urgent Care and sent to the Emergency Department via POV . Per Dr. Loreta Ave, patient is in need of higher level of care due to tachycardia and headache. Patient is aware and verbalizes understanding of plan of care.  Vitals:   12/03/23 1151 12/03/23 1155  BP: (!) 148/63   Pulse:    Resp: 20   SpO2:  100%

## 2023-12-03 NOTE — Discharge Instructions (Signed)
Patient will go to the emergency room for further evaluation by private car

## 2023-12-03 NOTE — ED Triage Notes (Signed)
Pt c/o tachycardia, dizziness, and bilateral arm pain for past month. Pt was sent to ED from UC w/caregiver.  Pt has had similar symptoms in the past.

## 2023-12-07 DIAGNOSIS — M159 Polyosteoarthritis, unspecified: Secondary | ICD-10-CM | POA: Diagnosis not present

## 2023-12-07 DIAGNOSIS — E785 Hyperlipidemia, unspecified: Secondary | ICD-10-CM | POA: Diagnosis not present

## 2023-12-07 DIAGNOSIS — I5189 Other ill-defined heart diseases: Secondary | ICD-10-CM | POA: Diagnosis not present

## 2023-12-07 DIAGNOSIS — I447 Left bundle-branch block, unspecified: Secondary | ICD-10-CM | POA: Diagnosis not present

## 2023-12-07 DIAGNOSIS — R251 Tremor, unspecified: Secondary | ICD-10-CM | POA: Diagnosis not present

## 2023-12-07 DIAGNOSIS — I10 Essential (primary) hypertension: Secondary | ICD-10-CM | POA: Diagnosis not present

## 2023-12-07 DIAGNOSIS — R002 Palpitations: Secondary | ICD-10-CM | POA: Diagnosis not present

## 2023-12-07 DIAGNOSIS — F419 Anxiety disorder, unspecified: Secondary | ICD-10-CM | POA: Diagnosis not present

## 2023-12-07 DIAGNOSIS — I471 Supraventricular tachycardia, unspecified: Secondary | ICD-10-CM | POA: Diagnosis not present

## 2023-12-15 DIAGNOSIS — H4051X3 Glaucoma secondary to other eye disorders, right eye, severe stage: Secondary | ICD-10-CM | POA: Diagnosis not present

## 2023-12-21 DIAGNOSIS — I119 Hypertensive heart disease without heart failure: Secondary | ICD-10-CM | POA: Diagnosis not present

## 2023-12-21 DIAGNOSIS — E785 Hyperlipidemia, unspecified: Secondary | ICD-10-CM | POA: Diagnosis not present

## 2023-12-21 DIAGNOSIS — I5189 Other ill-defined heart diseases: Secondary | ICD-10-CM | POA: Diagnosis not present

## 2023-12-22 ENCOUNTER — Other Ambulatory Visit: Payer: Self-pay | Admitting: Neurology

## 2023-12-27 ENCOUNTER — Other Ambulatory Visit: Payer: Self-pay | Admitting: Neurology

## 2023-12-28 DIAGNOSIS — E785 Hyperlipidemia, unspecified: Secondary | ICD-10-CM | POA: Diagnosis not present

## 2023-12-28 DIAGNOSIS — C50919 Malignant neoplasm of unspecified site of unspecified female breast: Secondary | ICD-10-CM | POA: Diagnosis not present

## 2023-12-28 DIAGNOSIS — M5090 Cervical disc disorder, unspecified, unspecified cervical region: Secondary | ICD-10-CM | POA: Diagnosis not present

## 2023-12-28 DIAGNOSIS — R82998 Other abnormal findings in urine: Secondary | ICD-10-CM | POA: Diagnosis not present

## 2023-12-28 DIAGNOSIS — I119 Hypertensive heart disease without heart failure: Secondary | ICD-10-CM | POA: Diagnosis not present

## 2023-12-28 DIAGNOSIS — Z1339 Encounter for screening examination for other mental health and behavioral disorders: Secondary | ICD-10-CM | POA: Diagnosis not present

## 2023-12-28 DIAGNOSIS — Z1331 Encounter for screening for depression: Secondary | ICD-10-CM | POA: Diagnosis not present

## 2023-12-28 DIAGNOSIS — Z0000 Encounter for general adult medical examination without abnormal findings: Secondary | ICD-10-CM | POA: Diagnosis not present

## 2023-12-28 DIAGNOSIS — J449 Chronic obstructive pulmonary disease, unspecified: Secondary | ICD-10-CM | POA: Diagnosis not present

## 2023-12-28 DIAGNOSIS — Z23 Encounter for immunization: Secondary | ICD-10-CM | POA: Diagnosis not present

## 2023-12-28 DIAGNOSIS — F419 Anxiety disorder, unspecified: Secondary | ICD-10-CM | POA: Diagnosis not present

## 2023-12-28 DIAGNOSIS — M159 Polyosteoarthritis, unspecified: Secondary | ICD-10-CM | POA: Diagnosis not present

## 2023-12-28 DIAGNOSIS — R251 Tremor, unspecified: Secondary | ICD-10-CM | POA: Diagnosis not present

## 2024-01-11 DIAGNOSIS — H6123 Impacted cerumen, bilateral: Secondary | ICD-10-CM | POA: Diagnosis not present

## 2024-01-14 DIAGNOSIS — I10 Essential (primary) hypertension: Secondary | ICD-10-CM | POA: Diagnosis not present

## 2024-01-14 DIAGNOSIS — R1084 Generalized abdominal pain: Secondary | ICD-10-CM | POA: Diagnosis not present

## 2024-01-14 DIAGNOSIS — E876 Hypokalemia: Secondary | ICD-10-CM | POA: Diagnosis not present

## 2024-01-14 DIAGNOSIS — R197 Diarrhea, unspecified: Secondary | ICD-10-CM | POA: Diagnosis not present

## 2024-01-14 DIAGNOSIS — R112 Nausea with vomiting, unspecified: Secondary | ICD-10-CM | POA: Diagnosis not present

## 2024-01-18 ENCOUNTER — Encounter: Payer: Self-pay | Admitting: Physician Assistant

## 2024-01-18 ENCOUNTER — Ambulatory Visit: Attending: Physician Assistant | Admitting: Physician Assistant

## 2024-01-18 VITALS — BP 144/62 | HR 85 | Ht 64.0 in | Wt 136.0 lb

## 2024-01-18 DIAGNOSIS — E876 Hypokalemia: Secondary | ICD-10-CM

## 2024-01-18 DIAGNOSIS — I471 Supraventricular tachycardia, unspecified: Secondary | ICD-10-CM

## 2024-01-18 DIAGNOSIS — I10 Essential (primary) hypertension: Secondary | ICD-10-CM | POA: Diagnosis not present

## 2024-01-18 NOTE — Progress Notes (Unsigned)
 Cardiology Office Note:  .   Date:  01/18/2024  ID:  Danielle Peters, DOB May 21, 1941, MRN [id number] PCP: Chilton Greathouse, MD  [facility] Providers Cardiologist:  Thurmon Fair, MD { Click to update primary MD,subspecialty MD or APP then REFRESH:1}   History of Present Illness: .   Danielle Peters is a 83 y.o. 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 Maple Hudson, shingles, left breast cancer in remission s/p lumpectomy and chemotherapy, essential tremor followed by Dr. 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  She has longstanding issue with severe hypercholesterolemia however she has been intolerant of rosuvastatin, atorvastatin, simvastatin, pravastatin, all which causes muscle aches.  She developed a rash on Zetia.  Myoview obtained in February 2018 showed EF 77%, hyperdynamic LV, otherwise normal perfusion.  CT of chest without contrast obtained on 08/01/2018 showed stable 5 mm subpleural left lower lobe lung nodule, mildly progressive pulmonary parenchymal finding of sarcoid, coronary artery calcification and aortic atherosclerosis.  Myoview obtained on 12/24/2018 showed EF 82%, no evidence of ischemia or infarction, overall normal study.  Last echocardiogram obtained on 10/28/2022 showed EF 60 to 65%, grade 1 DD, RVSP 41.1 mmHg, no significant valve issue.  She was seen in the emergency room in July 2024 with chest pain that is associated with movement.  She was wheezing at the time and the chest was tender to palpation.  Her troponin was negative.  EKG showed chronic abnormalities.  She was given a course of steroid to help with musculoskeletal pain.  She was last seen by Dr. Royann Shivers in August 2024 at which time she was doing well.  Blood pressure was elevated at the time, however she has a history of orthostatic hypotension, therefore we tolerate a higher than normal blood pressure.  1 year  follow-up was recommended.  She was recently seen in the ED on 12/03/2023 for racing heart rate and dizziness when she stands up or bend over.  EKG at the time showed sinus tachycardia with heart rate in the 110s.  Patient presents today for follow-up.  She is very frail.  Her blood pressure is elevated in the 140s which is where we want her blood pressure to run based on prior history of the orthostatic hypotension.  She says her potassium was recently low, based on KPN, her potassium was 3.0 on 01/14/2024.  Her PCP has placed her on 7-day course of potassium supplement.  She is quite concerned about her low potassium.  I think it may be reasonable to consider discontinuation of her low-dose hydrochlorothiazide and double up on her valsartan at 80 mg daily.  She has chronic chest discomfort that does not worsen with physical activity.  She will continue to observe this.  She can follow-up with Dr. Royann Shivers in 4 to 6 months.  ROS: ***  Studies Reviewed: .        *** Risk Assessment/Calculations:   {Does this patient have ATRIAL FIBRILLATION?:[phone]} The patient's 1st BP is elevated (>139/89)*** Repeat BP and {Click to enter a 2nd BP Refresh Note  :1}       Physical Exam:   VS:  BP (!) 144/62 (BP Location: Left Arm, Patient Position: Sitting, Cuff Size: Normal)   Pulse 85   Ht 5\' 4"  (1.626 m)   Wt 136 lb (61.7 kg)   SpO2 95%   BMI 23.34 kg/m    Wt Readings from Last 3 Encounters:  01/18/24 136 lb (61.7 kg)  12/03/23 136 lb (61.7 kg)  11/03/23 137 lb (62.1 kg)    GEN: Well nourished, well developed in no acute distress NECK: No JVD; No carotid bruits CARDIAC: ***RRR, no murmurs, rubs, gallops RESPIRATORY:  Clear to auscultation without rales, wheezing or rhonchi  ABDOMEN: Soft, non-tender, non-distended EXTREMITIES:  No edema; No deformity   ASSESSMENT AND PLAN: .   ***    {Are you ordering a CV Procedure (e.g. stress test, cath, DCCV, TEE, etc)?   Press F2        :[id number]}   Dispo: ***  Signed, Azalee Course, PA

## 2024-01-18 NOTE — Patient Instructions (Signed)
 Medication Instructions:  NO CHANGES *If you need a refill on your cardiac medications before your next appointment, please call your pharmacy*  Lab Work: NO LABS If you have labs (blood work) drawn today and your tests are completely normal, you will receive your results only by: MyChart Message (if you have MyChart) OR A paper copy in the mail If you have any lab test that is abnormal or we need to change your treatment, we will call you to review the results.  Testing/Procedures: NO TESTING  Follow-Up: At [facility], you and your health needs are our priority.  As part of our continuing mission to provide you with exceptional heart care, our providers are all part of one team.  This team includes your primary Cardiologist (physician) and Advanced Practice Providers or APPs (Physician Assistants and Nurse Practitioners) who all work together to provide you with the care you need, when you need it.  Your next appointment:   4-6 month(s)  Provider:   Thurmon Fair, MD   We recommend signing up for the patient portal called "MyChart".  Sign up information is provided on this After Visit Summary.  MyChart is used to connect with patients for Virtual Visits (Telemedicine).  Patients are able to view lab/test results, encounter notes, upcoming appointments, etc.  Non-urgent messages can be sent to your provider as well.   To learn more about what you can do with MyChart, go to [email or web address].   Other Instructions   1st Floor: - Lobby - Registration  - Pharmacy  - Lab - Cafe  2nd Floor: - PV Lab - Diagnostic Testing (echo, CT, nuclear med)  3rd Floor: - Vacant  4th Floor: - TCTS (cardiothoracic surgery) - AFib Clinic - Structural Heart Clinic - Vascular Surgery  - Vascular Ultrasound  5th Floor: - HeartCare Cardiology (general and EP) - Clinical Pharmacy for coumadin, hypertension, lipid, weight-loss medications, and med management  appointments    Valet parking services will be available as well.

## 2024-01-20 ENCOUNTER — Telehealth: Payer: Self-pay | Admitting: Physician Assistant

## 2024-01-20 NOTE — Telephone Encounter (Signed)
 Pt c/o medication issue:  1. Name of Medication: Hydrochlorothiazide  2. How are you currently taking this medication (dosage and times per day)?   3. Are you having a reaction (difficulty breathing--STAT)?   4. What is your medication issue? Patient said when she saw Wynema Birch last, he stopped her Hydrochlorothiazide. She said Wynema Birch said he was going to checkl with her primary doctor and decide on how Valsartan she needed to take. She said she have not heard anything. She wants to know how much Valsartan is supposed to be taking please.

## 2024-01-20 NOTE — Telephone Encounter (Signed)
 Would recommend stop hydrochlorothiazide and double up on valsartan at 80mg  daily. Repeat BMET in 2-3 weeks to make sure no further hypokalemia

## 2024-01-21 DIAGNOSIS — I10 Essential (primary) hypertension: Secondary | ICD-10-CM | POA: Diagnosis not present

## 2024-01-21 DIAGNOSIS — R110 Nausea: Secondary | ICD-10-CM | POA: Diagnosis not present

## 2024-01-21 DIAGNOSIS — R197 Diarrhea, unspecified: Secondary | ICD-10-CM | POA: Diagnosis not present

## 2024-01-21 DIAGNOSIS — K219 Gastro-esophageal reflux disease without esophagitis: Secondary | ICD-10-CM | POA: Diagnosis not present

## 2024-01-21 DIAGNOSIS — E876 Hypokalemia: Secondary | ICD-10-CM | POA: Diagnosis not present

## 2024-02-13 ENCOUNTER — Other Ambulatory Visit: Payer: Self-pay | Admitting: Neurology

## 2024-02-16 DIAGNOSIS — L2989 Other pruritus: Secondary | ICD-10-CM | POA: Diagnosis not present

## 2024-02-23 ENCOUNTER — Telehealth: Payer: Self-pay | Admitting: Neurology

## 2024-02-23 ENCOUNTER — Encounter: Payer: Self-pay | Admitting: Podiatry

## 2024-02-23 ENCOUNTER — Ambulatory Visit: Payer: Medicare PPO | Admitting: Podiatry

## 2024-02-23 VITALS — Ht 64.0 in | Wt 136.0 lb

## 2024-02-23 DIAGNOSIS — Q828 Other specified congenital malformations of skin: Secondary | ICD-10-CM

## 2024-02-23 DIAGNOSIS — M79676 Pain in unspecified toe(s): Secondary | ICD-10-CM

## 2024-02-23 DIAGNOSIS — B351 Tinea unguium: Secondary | ICD-10-CM | POA: Diagnosis not present

## 2024-02-23 DIAGNOSIS — L84 Corns and callosities: Secondary | ICD-10-CM | POA: Diagnosis not present

## 2024-02-23 DIAGNOSIS — D689 Coagulation defect, unspecified: Secondary | ICD-10-CM | POA: Diagnosis not present

## 2024-02-23 NOTE — Telephone Encounter (Signed)
 Pt said tremors have gotten worse. Also feet are swollen, hurts at my ankle. Seen my foot doctor this morning. She suggested to see my neurologist,   Informed patient would need a referral for feet being swollen.

## 2024-02-23 NOTE — Telephone Encounter (Signed)
 Please see Sept 2024 plan, ongoing issues for many years, tried and failed different medications in the past.  Options are  Higher dose of Primidone , she is on 50mg  2 tabs twice a day, may add extra one tab at noon, if she can tolerate it, then can titrating to 50mg  2 tabs tid. Keep July appointment   Tremor             Action more than resting component, there was no bradykinesia, rigidity,             Over the years tried low-dose beta-blocker, clonazepam , Xanax with limited help,             We have talked about other possibilities such as Sinemet, " if I do not have Parkinson's disease I do not want to try it", she also does not want to be seen by [facility] movement specialist             Now is back on primidone  50 mg 2 tablets twice a day

## 2024-02-23 NOTE — Telephone Encounter (Signed)
 Called and spoke to pt and stated:  Higher dose of Primidone , she is on 50mg  2 tabs twice a day, may add extra one tab at noon, if she can tolerate it, then can titrating to 50mg  2 tabs tid. Keep July appointment  Pt voiced gratitude and understanding

## 2024-02-23 NOTE — Telephone Encounter (Signed)
 Call to patient who reports worsening tremors in her hands. She reports medication compliance with the primidone . She states they have been worse in the last 2 months. She does report that at time she will drop stuff due to the tremors.  She also reports swelling in the feet and advised to see PCP or knew referral, likely not neurologial

## 2024-02-24 NOTE — Progress Notes (Signed)
 HPI  Female never smoker followed for sarcoid, chronic bronchitis complicated by history of right breast cancer/ XRT, tachycardia, GERD, anxiety, glaucoma, tremor.Aaron Aas  PCP Dr Mardene Shake Med Cardiac Study 11/25/16- EF 77%, hyperdynamic, otw  WNL Office Spirometry 12/31/2016-mild restriction of exhaled volume. FVC 1.61/77%, FEV1 1.37/86%, ratio 0.85, FEF 25-75% 2.00/ 144% -----------------------------------------------------------------------------------------.   08/27/23- 83 year old female never smoker followed for Sarcoid, Chronic Bronchitis, accompanied by hx Right Breast CA/XRT, Tachycardia, GERD, anxiety, Glaucoma, Tremor, HTN,   Spiriva  Respimat, Flonase , Claritin, Neb Albuterol ,  Breo then Advair changed to Spiriva  due to tachycardia. -----Breathing is good  -Declines flu vax "always make me sick" She had stopped using Advair/Wixela because of tachypalpitations, without noticing a lot of difference in her breathing.  I thought she already had a trial of Spiriva  but she does not recognize the name.  I will give her samples today to see if a non-LABA bronchodilator helps her.  Much of her problem is old fixed scarring. She asked if I would order update mammogram, saying her last one was 2 years ago.  Oncology no longer follows her after remote right breast cancer.  She has not noticed anything different.  02/24/24- 83 year old female never smoker followed for Sarcoid, Chronic Bronchitis, accompanied by hx Right Breast CA/XRT, Tachycardia, GERD, anxiety, Glaucoma, Tremor, HTN,   Spiriva  2.5 Respimat, Flonase , Claritin, Neb Albuterol ,  Breo then Advair/ Wixela 250 changed to Spiriva  due to tachycardia. Discussed the use of AI scribe software for clinical note transcription with the patient, who gave verbal consent to proceed.  History of Present Illness   Caliah [place]  Gervase is an 83 year old female who presents with difficulty using inhalers.  She has a worsening hand tremor that significantly  impairs her ability to use inhalers effectively. Despite increasing her medication dosage to three times a day, she remains uncertain about its effectiveness. A follow-up appointment with neurology is scheduled for June 3rd.  Radiation therapy has resulted in chest scarring and occasional squeaking sounds, but no wheezing. She has been using inhalers for a condition previously described as asthmatic bronchitis, though she is unsure if they are still necessary. Different caregivers assist her, but not all are familiar with inhaler use.  She experiences seasonal allergies, particularly with pollen, causing watery eyes and an itchy, runny nose. She takes Allegra 180 mg once daily, which effectively manages these symptoms. Her breathing changes with the seasons, especially during allergy season when pollen is high.     CXR 11/04/22 IMPRESSION: Right basilar linear atelectasis/scarring. No acute cardiopulmonary process.  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 Inhalers discontinued due to hand tremor and absence of wheezing. - Discontinue inhalers. - Report respiratory status changes to primary care physician. - Follow up with primary care physician for respiratory concerns.  Allergic rhinitis Symptoms managed with Allegra 180 mg, likely exacerbated by pollen. - Continue Allegra 180 mg daily. - Consider generic Allegra if preferred.  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  Hand tremor Worsened tremor affects inhaler use, managed by neurologist with medication adjustment. - Keep appointment with neurologist on June 3rd for further management.     ROS- see HPI  + = positive Constitutional:   No-   weight loss, night sweats, fevers, chills,+ fatigue, lassitude. HEENT:   No-  headaches, difficulty swallowing, tooth/dental problems, sore throat,       No-  sneezing,  itching, ear ache, nasal congestion, +post nasal  drip,  CV:   chest pain, orthopnea, PND, swelling in lower extremities, anasarca, dizziness, palpitations Resp: No-   shortness of breath with exertion or at rest.              productive cough,  + non-productive cough,  No-  coughing up of blood.              No-   change in color of mucus.  +wheezing.   Skin: +HPI GI:  No-   heartburn, indigestion, abdominal pain, nausea, vomiting,  GU:. MS:  No-   joint pain or swelling. + pain across mid thoracic back around to sides. 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 No depression or anxiety.  No memory loss.  Obj General- Alert, Oriented, Affect-appropriate, Distress- none acute,                     + Always slumped, almost kyphotic posture Skin- + hyperpigmentation  Several isolated spots Lymphadenopathy- none Head- atraumatic            Eyes- Gross vision intact, PERRLA, conjunctivae clear secretions            Ears- Hearing, canals-normal            Nose- Clear, no-Septal dev, mucus, polyps, erosion, perforation             Throat- Mallampati II , mucosa clear , drainage- none, tonsils- atrophic. + Dentures Neck- flexible , trachea midline, no stridor , thyroid  nl, carotid no bruit Chest - symmetrical excursion , unlabored           Heart/CV- RRR with skipped beats , no murmur , no gallop  , no rub, nl s1 s2                - JVD- none , edema- none, stasis changes- none, varices- none           Lung- + inspiratory squeaks R>L ,  Work of breathing is not increased.   cough- none , dullness-none, rub- none           Chest wall- +treatment Scar R breast.  Abd- No HSM Br/ Gen/ Rectal- Not done, not indicated Extrem- cyanosis- none, clubbing, none, atrophy- none, strength- nl Neuro- + significant tremor hands

## 2024-02-25 ENCOUNTER — Encounter: Payer: Self-pay | Admitting: Internal Medicine

## 2024-02-25 ENCOUNTER — Ambulatory Visit: Payer: Medicare PPO | Admitting: Internal Medicine

## 2024-02-25 VITALS — BP 140/74 | HR 108 | Temp 97.6°F | Ht 64.0 in | Wt 136.6 lb

## 2024-02-25 DIAGNOSIS — J4489 Other specified chronic obstructive pulmonary disease: Secondary | ICD-10-CM

## 2024-02-25 NOTE — Patient Instructions (Addendum)
 I am glad that your lungs are stable. I am suggesting that you follow with Dr Avva for your breathing issues like asthmatic bronchitis going forward. If he needs us  to see you again we would be happy to.  Your hand tremor makes it hard for you to work the inhalers, but I can't tell that you need them anymore. Let's see how you do without using any inhalers. Ok to keep using the antihistamine Allegra 180 when needed for allergy problems like itching and sneezing and watery eyes.

## 2024-02-27 NOTE — Progress Notes (Signed)
 Subjective:  Patient ID: Danielle Binet [place]  Peters, female    DOB: 1940-10-23,  MRN: [id number]  Danielle [place]  Peters presents to clinic today for at risk foot care with h/o clotting disorder and preulcerative lesion(s) left foot and painful mycotic toenails that limit ambulation. Painful toenails interfere with ambulation. Aggravating factors include wearing enclosed shoe gear. Pain is relieved with periodic professional debridement. Painful preulcerative lesion(s) is/are aggravated when weightbearing with and without shoegear. Pain is relieved with periodic professional debridement.  Chief Complaint  Patient presents with   Nail Problem    Pt is here for [facility] PCP is Dr Thor Fling and LOV was in April.   New problem(s): None.   PCP is Avva, Ravisankar, MD.  Allergies  Allergen Reactions   Dextromethorphan Polistirex Er Other (See Comments)    SOB   Primidone      Severe Headaches   Albuterol      REACTION: tachycardia   Amoxicillin Diarrhea, Nausea Only and Other (See Comments)   Aspirin Other (See Comments)    Adult dose   Atorvastatin Other (See Comments)   Celecoxib Other (See Comments)   Clonazepam  Hives and Other (See Comments)   Doxycycline  Hyclate Other (See Comments)   Duloxetine  Nausea And Vomiting    dizzy   Levaquin [Levofloxacin] Other (See Comments)    Per patient, caused tendonitis.    Loratadine    Nortriptyline      Increase heart rate.   Propranolol  Hcl Other (See Comments)    Dizziness, Fatgue   Simvastatin     Myalgias    Ezetimibe  Rash    Review of Systems: Negative except as noted in the HPI.  Objective: No changes noted in today's physical examination. There were no vitals filed for this visit. Danielle [place]  Peters is a pleasant 83 y.o. female WD, WN in NAD. AAO x 3.  Vascular Examination: Vascular status intact b/l with palpable pedal pulses. CFT immediate b/l. No edema. No pain with calf compression b/l. Skin temperature gradient WNL b/l. No  ischemia or gangrene noted b/l LE. No cyanosis or clubbing noted b/l LE.  Neurological Examination: Pt has subjective symptoms of neuropathy.Sensation grossly intact b/l with 10 gram monofilament. Vibratory sensation intact b/l.   Dermatological Examination: Pedal skin with normal turgor, texture and tone b/l. Toenails 1-5 b/l thick, discolored, elongated with subungual debris and pain on dorsal palpation.Hyperkeratotic lesion(s) L 5th toe.  No erythema, no edema, no drainage, no fluctuance.   Porokeratotic lesion(s) right fifth digit. No erythema, no edema, no drainage, no fluctuance. Preulcerative lesion noted left heel. There is visible subdermal hemorrhage. There is no surrounding erythema, no edema, no drainage, no odor, no fluctuance.  Musculoskeletal Examination: Muscle strength 5/5 to b/l LE. Hammertoe(s) noted to the L 5th toe and R 5th toe.  Radiographs: None  Assessment/Plan: 1. Pain due to onychomycosis of toenail   2. Porokeratosis   3. Pre-ulcerative calluses   4. Clotting disorder [facility])   Consent given for treatment. Patient examined. All patient's and/or POA's questions/concerns addressed on today's visit. Mycotic toenails 1-5 debrided in length and girth without incident. Porokeratotic lesion(s) right fifth digit pared and enucleated with sharp debridement without incident. Preulcerative lesion left heel debrided without incident. Continue soft, supportive shoe gear daily. Report any pedal injuries to medical professional. Call office if there are any questions/concerns. -Patient/POA to call should there be question/concern in the interim.   Return in about 3 months (around 05/25/2024).  Luella Sager, DPM      [facility] LOCATION:  [address] [place] [address]                   Office [phone]   [facility] LOCATION: [address] [place], [place] [address] Office [phone]

## 2024-03-08 DIAGNOSIS — K580 Irritable bowel syndrome with diarrhea: Secondary | ICD-10-CM | POA: Diagnosis not present

## 2024-03-08 DIAGNOSIS — M7989 Other specified soft tissue disorders: Secondary | ICD-10-CM | POA: Diagnosis not present

## 2024-03-08 DIAGNOSIS — I10 Essential (primary) hypertension: Secondary | ICD-10-CM | POA: Diagnosis not present

## 2024-03-08 DIAGNOSIS — G63 Polyneuropathy in diseases classified elsewhere: Secondary | ICD-10-CM | POA: Diagnosis not present

## 2024-03-08 DIAGNOSIS — I872 Venous insufficiency (chronic) (peripheral): Secondary | ICD-10-CM | POA: Diagnosis not present

## 2024-03-15 ENCOUNTER — Encounter: Payer: Self-pay | Admitting: Internal Medicine

## 2024-03-15 DIAGNOSIS — H4051X3 Glaucoma secondary to other eye disorders, right eye, severe stage: Secondary | ICD-10-CM | POA: Diagnosis not present

## 2024-03-21 ENCOUNTER — Encounter: Payer: Self-pay | Admitting: Neurology

## 2024-03-22 ENCOUNTER — Telehealth: Payer: Self-pay | Admitting: Neurology

## 2024-03-22 NOTE — Telephone Encounter (Signed)
 Pt called to Schedule Appt  Appt Scheduled

## 2024-05-02 ENCOUNTER — Ambulatory Visit: Admitting: Neurology

## 2024-05-04 ENCOUNTER — Telehealth: Payer: Self-pay | Admitting: Neurology

## 2024-05-04 MED ORDER — PRIMIDONE 50 MG PO TABS: 100.0000 mg | ORAL_TABLET | Freq: Two times a day (BID) | ORAL | 0 refills | Status: DC

## 2024-05-04 NOTE — Telephone Encounter (Signed)
 refilled

## 2024-05-04 NOTE — Telephone Encounter (Signed)
 Pt called stating that she is needing a refill of her primidone  (MYSOLINE ) 50 MG tablet sent to the [facility] on [address]

## 2024-05-17 DIAGNOSIS — Z947 Corneal transplant status: Secondary | ICD-10-CM | POA: Diagnosis not present

## 2024-05-17 DIAGNOSIS — H02403 Unspecified ptosis of bilateral eyelids: Secondary | ICD-10-CM | POA: Diagnosis not present

## 2024-05-17 DIAGNOSIS — H4051X3 Glaucoma secondary to other eye disorders, right eye, severe stage: Secondary | ICD-10-CM | POA: Diagnosis not present

## 2024-05-17 DIAGNOSIS — Z961 Presence of intraocular lens: Secondary | ICD-10-CM | POA: Diagnosis not present

## 2024-05-30 ENCOUNTER — Encounter: Payer: Self-pay | Admitting: Neurology

## 2024-05-30 ENCOUNTER — Ambulatory Visit: Admitting: Neurology

## 2024-05-30 ENCOUNTER — Telehealth: Payer: Self-pay | Admitting: Neurology

## 2024-05-30 VITALS — BP 187/84 | HR 99 | Ht 64.0 in | Wt 129.0 lb

## 2024-05-30 DIAGNOSIS — G250 Essential tremor: Secondary | ICD-10-CM

## 2024-05-30 DIAGNOSIS — F419 Anxiety disorder, unspecified: Secondary | ICD-10-CM | POA: Diagnosis not present

## 2024-05-30 DIAGNOSIS — R269 Unspecified abnormalities of gait and mobility: Secondary | ICD-10-CM | POA: Diagnosis not present

## 2024-05-30 DIAGNOSIS — R52 Pain, unspecified: Secondary | ICD-10-CM

## 2024-05-30 MED ORDER — PRIMIDONE 50 MG PO TABS: 100.0000 mg | ORAL_TABLET | Freq: Two times a day (BID) | ORAL | 3 refills | Status: AC

## 2024-05-30 MED ORDER — ALPRAZOLAM 0.5 MG PO TABS: 0.5000 mg | ORAL_TABLET | Freq: Two times a day (BID) | ORAL | 1 refills | Status: DC | PRN

## 2024-05-30 NOTE — Telephone Encounter (Signed)
 [facility] is going to take this patient.

## 2024-05-30 NOTE — Progress Notes (Signed)
 ASSESSMENT AND PLAN 83 y.o. year old female   Tremor  Action more than resting component, there was no bradykinesia, rigidity,  Over the years tried low-dose beta-blocker, clonazepam , Xanax , primidone  up to 100 mg twice a day with limited help,  She also does not want to be seen by [facility] movement specialist, hesitated to try other medications  Refill her primidone  50 mg 2 tablets twice a day  Gait abnormality  She had the slow worsening gait abnormality, likely due to combination of aging, deconditioning, fixed cervical anterocollis, limb deep muscle achy pain  MRI of the brain in March 2022 showed mild small vessel disease generalized atrophy  Laboratory evaluation including inflammatory markers,  Referred to physical therapy  Add on low-dose Cymbalta  30 mg daily   DIAGNOSTIC DATA (LABS, IMAGING, TESTING) - I reviewed patient records, labs, notes, testing and imaging myself where available.  Addendum: Laboratory evaluation from primary care physician Dr. Janey Santos, MD September 2022, glucose 92, GFR 68.8 creatinine 0.8, normal CMP, CBC hemoglobin of 12.8, LDL 175, cholesterol 271, J8r 5.8, positive COVID-19 July 04, 2021 rapid antigen test  HISTORY OF PRESENT ILLNESS: Danielle Peters is a 83 year old right-handed American female, follow up for essential tremor     She has past medical history of hypertension, hyperlipidemia, anxiety, history of right breast cancer, status post lobectomy in 2004, followed by radiation and chemotherapy, she developed bilateral hands paresthesia following chemotherapy consistent with-induced peripheral neuropathy. She presenting with few years history of bilateral hands tremor, getting worse when she stretched out her hands, or using utensils, she has describe difficulty feeding herself, cause a lot of social embarrasement, she denies significant gait difficulty, she also has head titubation She denied bilateral lower exremity paresthesia  weakness, but does complain fingertips numbness tingling. She denied loss of smell, REM sleep disorder, has anxiety symptoms, denied family history of tremor Over the past few years, we have tried primadone, while taking 50mg  1/2 once a day , there was no significant improvement at that time, she also complains of headaches, but also tried diazepam , Ativan , without significant improvement   UPDATE Oct 08 2014:  She is no longer taking primidone , she is taking clonazepam  0.5 mg half to one tablets every morning, which has helped her tremor, she complains of anxiety, but does not want to take more medications for it   UPDATE Oct 07 2016:  She took clonazepam  0.5 milligrams half to 1 tablet as needed for tremor for a while without significant improvement, she continue have worsening bilateral hands resting and posturing tremor, occasionally involving bilateral lower extremity, she denies significant gait abnormality, she exercise regularly, she complains of recent onset of left-sided low back pain, radiating pain to left lower extremity, left calf muscle. She is taking Lyrica 75 mg a day   She just healed from right arm shingles in June 2017, still has intermittent itching in her right arm and hand,   UPDATE January 05 2017: She complains of constant bilateral hand resting and posturing tremor, previously tried primidone , clonazepam  with limited help, currently she is taking Valium  2 mg as needed, which helps her some, she previously took metoprolol  low-dose did not notice any help for her tremor,   This is most consistent with essential tremor, there was no family history of similar disease, no parkinsonian features.   Her low back pain has much improved, she has stopped taking nortriptyline    UPDATE May 31 2018: Her older sister has parkinson's disease.  She  now noticed worsening bilateral hands tremor, previously could not tolerate clonazepam  cause excessive drowsiness, beta-blocker, she did not  notice any significant benefit, she is now taking primidone  50 mg twice a day, higher dose would cause significant drowsiness, she denied loss of sense of smell, she denies significant gait abnormality.  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, her brother check on her recently, she also complains of anxiety, grieving, one of her brother died last week, she has good appetite, difficulty sleeping, living apartment by herself,  UPDATE June 10 2021: She complains of bilateral feet paresthesia, was seen by podiatrist, has calluses on her feet, this also increase her mildly unsteady gait  She continue bothered by significant bilateral hand posturing tremor, difficulty with daily activity, such as holding utensils, currently on primidone  50 mg 1/1.5 tab a day, will add on beta-blocker  UPDATE Sept 17 2024: She is accompanied by caregiver at today's visit, overall doing very well, slow worsening large amplitude action tremor, on tramadol  50 mg 2 tablets twice a day, put major limitation in her daily activity, mild gait abnormality  Today's examination also demonstrate muscle pain upon deep palpitation,  UPDATE May 30 2024: She has care giver 3 times a week from 9 to 1pm, she continues to have significant action tremor, affecting her daily activity despite taking primidone  100 mg twice a day, she is under a lot of stress, younger brother passed away unexpectedly few days ago, she is concerned about some weight loss, difficulty preparing her food due to tremor, also concerned about her gait abnormality  PHYSICAL EXAM  Vitals:   07/06/23 1120  BP: 134/73  Pulse: 91  Weight: 137 lb 8 oz (62.4 kg)  Height: 5' 4 (1.626 m)   Body mass index is 23.6  kg/m.   PHYSICAL EXAMNIATION:  Gen: NAD, conversant, well nourised, well groomed      NEUROLOGICAL EXAM:  MENTAL STATUS: Speech/Cognition: Awake, alert, normal speech, oriented to history taking and casual conversation.  CRANIAL NERVES: CN II: Visual fields are full to confrontation.  Pupils are round equal and briskly reactive to light. CN III, IV, VI: extraocular movement are normal. No ptosis. CN V: Facial sensation is intact to light touch. CN VII: Face is symmetric with normal eye closure and smile. CN VIII: Hearing is normal to casual conversation CN IX, X: Palate elevates symmetrically. Phonation is normal. CN XI: Head turning and shoulder shrug are intact   MOTOR: Action more than resting bilateral hands large amplitude tremor, normal strength no rigidity no bradykinesia  REFLEXES: Reflexes are 1 and symmetric at the biceps, triceps, knees and ankles. Plantar responses are flexor.  SENSORY: Intact to light touch, pinprick, positional and vibratory sensation at fingers and toes.  COORDINATION: There is no trunk or limb ataxia.    GAIT/STANCE: She needs push-up to get up from seated position, wide-based, cautious gait, anterocollis   REVIEW OF SYSTEMS: Out of a complete 14 system review of symptoms, the patient complains only of the following symptoms, and all other reviewed systems are negative.  Difficulty sleeping ALLERGIES: Allergies  Allergen Reactions   Dextromethorphan Polistirex Er Other (See Comments)    SOB   Primidone      Severe Headaches   Albuterol      REACTION: tachycardia  Amoxicillin Diarrhea, Nausea Only and Other (See Comments)   Aspirin Other (See Comments)    Adult dose   Atorvastatin Other (See Comments)   Celecoxib Other (See Comments)   Clonazepam  Hives and Other (See Comments)   Doxycycline  Hyclate Other (See Comments)   Duloxetine  Nausea And Vomiting    dizzy   Levaquin [Levofloxacin] Other (See Comments)    Per patient,  caused tendonitis.    Loratadine    Nortriptyline      Increase heart rate.   Propranolol  Hcl Other (See Comments)    Dizziness, Fatgue   Simvastatin     Myalgias    Ezetimibe  Rash    HOME MEDICATIONS: Outpatient Medications Prior to Visit  Medication Sig Dispense Refill   aspirin 81 MG tablet Take 81 mg by mouth every other day.     brimonidine (ALPHAGAN) 0.2 % ophthalmic solution Ophthalmic for 37 Days     carboxymethylcellulose (REFRESH PLUS) 0.5 % SOLN Place 1 drop into both eyes daily as needed.     dorzolamide (TRUSOPT) 2 % ophthalmic solution Place 1 drop into the right eye daily.     fexofenadine (ALLEGRA) 60 MG tablet Take 60 mg by mouth 2 (two) times daily.     fluticasone  (FLONASE ) 50 MCG/ACT nasal spray SPRAY 2 SPRAYS INTO EACH NOSTRIL EVERY DAY 48 mL 3   hydrochlorothiazide  (MICROZIDE ) 12.5 MG capsule Take 1 capsule (12.5 mg total) by mouth daily. 90 capsule 3   hydrOXYzine (ATARAX) 25 MG tablet Take 12.5-25 mg by mouth daily as needed.     meclizine  (ANTIVERT ) 25 MG tablet Take 1 tablet (25 mg total) by mouth 3 (three) times daily as needed for dizziness. 30 tablet 2   Moxifloxacin HCl 0.5 % SOLN      ondansetron  (ZOFRAN -ODT) 8 MG disintegrating tablet Take 8 mg by mouth every 8 (eight) hours as needed.     pantoprazole (PROTONIX) 40 MG tablet Take 40 mg by mouth 2 (two) times daily.     polyethylene glycol (MIRALAX / GLYCOLAX) 17 g packet Take 17 g by mouth daily as needed.     pravastatin  (PRAVACHOL ) 40 MG tablet Take 40 mg by mouth daily.     prednisoLONE acetate (PRED FORTE) 1 % ophthalmic suspension Apply to eye.     primidone  (MYSOLINE ) 50 MG tablet Take 2 tablets (100 mg total) by mouth in the morning and at bedtime. 180 tablet 0   sodium chloride (MURO 128) 5 % ophthalmic solution      valsartan  (DIOVAN ) 40 MG tablet Take 1 tablet (40 mg total) by mouth daily. 90 tablet 3   Vitamin D , Ergocalciferol , (DRISDOL ) 1.25 MG (50000 UNIT) CAPS capsule TAKE 1 CAPSULE  (50,000 UNITS TOTAL) BY MOUTH EVERY 7 (SEVEN) DAYS 5 capsule 0   ALPRAZolam  (XANAX ) 0.25 MG tablet      diclofenac  Sodium (VOLTAREN ) 1 % GEL      DULoxetine  (CYMBALTA ) 30 MG capsule TAKE 1 CAPSULE BY MOUTH EVERY DAY 90 capsule 4   potassium chloride (KLOR-CON) 10 MEQ tablet Take 10 mEq by mouth 2 (two) times daily.     Tiotropium Bromide  Monohydrate (SPIRIVA  RESPIMAT) 2.5 MCG/ACT AERS Inhale 2 puffs into the lungs daily. 4 g 5   WIXELA INHUB 250-50 MCG/ACT AEPB Inhale into the lungs.     Facility-Administered Medications Prior to Visit  Medication Dose Route Frequency Provider Last Rate Last Admin   albuterol  (PROVENTIL ) (2.5 MG/3ML) 0.083% nebulizer solution 2.5 mg  2.5 mg Nebulization Once [place], [place]  D, MD        PAST MEDICAL HISTORY: Past Medical History:  Diagnosis Date   Anemia    Anxiety    Breast cancer (HCC) 2004   right   Cancer [facility])    breast ca   Chest pain    echo 01/08/10- nl lv; mild aortic sclerosis; myoview  01/08/10- no ischemia   Dyslipidemia    myaligia with statins   Esophageal reflux    Hypertension    Neuropathy    Personal history of chemotherapy 2004   Personal history of radiation therapy 2004   Pulmonary sarcoidosis (HCC)    Shingles 03/2016   Tremor    upper extremities   Ventricular tachycardia (paroxysmal) (HCC)    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; AV node reentry tachycardia ablation     PAST SURGICAL HISTORY: Past Surgical History:  Procedure Laterality Date   Boil Right 03/2015   on buttock, excision   BREAST LUMPECTOMY Right 2004   CHOLECYSTECTOMY     left breast lumpectomy,chemo  2004   xrt   SKIN BIOPSY     TUBAL LIGATION     UPPER GI ENDOSCOPY  02/2015   difficulty swallowing, Abd pain    FAMILY HISTORY: Family History  Problem Relation Age of Onset   Cancer Mother    Heart failure Father 63   Heart attack Sister        2 heart attacks   Breast cancer Neg Hx    BRCA 1/2 Neg Hx     SOCIAL HISTORY: Social History    Socioeconomic History   Marital status: Divorced    Spouse name: Not on file   Number of children: 2   Years of education: 12   Highest education level: Not on file  Occupational History   Occupation: school Engineer, petroleum  Tobacco Use   Smoking status: Never   Smokeless tobacco: Never  Vaping Use   Vaping status: Never Used  Substance and Sexual Activity   Alcohol  use: No   Drug use: No   Sexual activity: Not on file  Other Topics Concern   Not on file  Social History Narrative   Patient lives at home alone.   Right Handed   1 cup caffeine daily   Social Drivers of Corporate investment banker Strain: Not on file  Food Insecurity: Not on file  Transportation Needs: Not on file  Physical Activity: Not on file  Stress: Not on file  Social Connections: Not on file  Intimate Partner Violence: Not on file    Danielle Peters, M.D. Ph.D.  [facility] [address] [place], [place] [address] Phone: [phone] Fax:      [phone]

## 2024-06-01 NOTE — Telephone Encounter (Signed)
 Noted

## 2024-06-01 NOTE — Telephone Encounter (Signed)
 Cara from [facility] called to inform the provider that the pt has been scheduled on the 19th for her PT Eval.

## 2024-06-06 ENCOUNTER — Encounter: Payer: Self-pay | Admitting: Podiatry

## 2024-06-06 ENCOUNTER — Ambulatory Visit: Admitting: Podiatry

## 2024-06-06 DIAGNOSIS — D689 Coagulation defect, unspecified: Secondary | ICD-10-CM

## 2024-06-06 DIAGNOSIS — Q828 Other specified congenital malformations of skin: Secondary | ICD-10-CM | POA: Diagnosis not present

## 2024-06-06 DIAGNOSIS — B351 Tinea unguium: Secondary | ICD-10-CM

## 2024-06-06 DIAGNOSIS — L84 Corns and callosities: Secondary | ICD-10-CM

## 2024-06-06 DIAGNOSIS — M79676 Pain in unspecified toe(s): Secondary | ICD-10-CM | POA: Diagnosis not present

## 2024-06-06 NOTE — Progress Notes (Signed)
 Subjective:  Patient ID: Danielle Peters  [place], female    DOB: December 09, 1940,  MRN: [id number]  Danielle [place]  Peters presents to clinic today for at risk foot care with h/o clotting disorder, painful porokeratotic lesions right foot. Pain prevent(s) comfortable ambulation. Aggravating factor is weightbearing with and without shoegear, preulcerative lesion(s) left foot, porokeratotic lesion lesion(s) right foot and painful mycotic toenails that limit ambulation. Painful toenails interfere with ambulation. Aggravating factors include wearing enclosed shoe gear. Pain is relieved with periodic professional debridement. is/are aggravated when weightbearing with and without shoegear. She is accompanied by her provider from [facility] [place], on today's visit. Chief Complaint  Patient presents with   RFC    Rm16 Routine foot care/ Dr. Janey Last visit May 2025   New problem(s): None.   PCP is Avva, Ravisankar, MD.  Allergies  Allergen Reactions   Dextromethorphan Polistirex Er Other (See Comments)    SOB   Primidone      Severe Headaches   Albuterol      REACTION: tachycardia   Amoxicillin Diarrhea, Nausea Only and Other (See Comments)   Aspirin Other (See Comments)    Adult dose   Atorvastatin Other (See Comments)   Celecoxib Other (See Comments)   Clonazepam  Hives and Other (See Comments)   Doxycycline  Hyclate Other (See Comments)   Duloxetine  Nausea And Vomiting    dizzy   Levaquin [Levofloxacin] Other (See Comments)    Per patient, caused tendonitis.    Loratadine    Nortriptyline      Increase heart rate.   Propranolol  Hcl Other (See Comments)    Dizziness, Fatgue   Simvastatin     Myalgias    Ezetimibe  Rash    Review of Systems: Negative except as noted in the HPI.  Objective: No changes noted in today's physical examination. There were no vitals filed for this visit. Danielle [place]  Peters is a pleasant 83 y.o. female thin build in NAD. AAO x  3.  Vascular Examination: Vascular status intact b/l with palpable pedal pulses. CFT immediate b/l. No edema. No pain with calf compression b/l. Skin temperature gradient WNL b/l. No ischemia or gangrene noted b/l LE. No cyanosis or clubbing noted b/l LE.  Neurological Examination: Pt has subjective symptoms of neuropathy.Sensation grossly intact b/l with 10 gram monofilament. Vibratory sensation intact b/l.   Dermatological Examination: Pedal skin with normal turgor, texture and tone b/l. Toenails 1-5 b/l thick, discolored, elongated with subungual debris and pain on dorsal palpation.  Porokeratotic lesion(s) right fifth digit. No erythema, no edema, no drainage, no fluctuance. Preulcerative lesion noted posterior left heel. There is visible subdermal hemorrhage. There is no surrounding erythema, no edema, no drainage, no odor, no fluctuance.  Musculoskeletal Examination: Muscle strength 5/5 to b/l LE. Hammertoe(s) noted to the L 5th toe and R 5th toe.  Radiographs: None  Assessment/Plan: 1. Pain due to onychomycosis of toenail   2. Porokeratosis   3. Pre-ulcerative calluses   4. Clotting disorder [facility])    -Patient was evaluated today. All questions/concerns addressed on today's visit. -Patient to continue soft, supportive shoe gear daily. -Again recommended patient wear heel protectors to prevent preulcerative lesion left heel. -Toenails 1-5 b/l were debrided in length and girth with sterile nail nippers and dremel without iatrogenic bleeding.  -Preulcerative lesion pared dorsal PIPJ of R 5th toe utilizing sterile scalpel blade. Total number pared=1. -Porokeratotic lesion(s) posterior aspect of left heel pared and enucleated with sterile currette without incident. Total number of lesions debrided=1. -Patient/POA to  call should there be question/concern in the interim.   Return in about 3 months (around 09/06/2024).  Delon LITTIE Merlin, DPM      [place] LOCATION: [address] [place] [address]                   Office [phone]   [facility] LOCATION: [address] [place], [place] [address] Office [phone]

## 2024-06-07 ENCOUNTER — Telehealth: Payer: Self-pay | Admitting: Neurology

## 2024-06-07 NOTE — Telephone Encounter (Signed)
 MRI order was sent to [facility] to schedule [phone]

## 2024-06-11 ENCOUNTER — Encounter: Payer: Self-pay | Admitting: Podiatry

## 2024-06-13 ENCOUNTER — Telehealth: Payer: Self-pay | Admitting: Neurology

## 2024-06-13 DIAGNOSIS — F419 Anxiety disorder, unspecified: Secondary | ICD-10-CM | POA: Diagnosis not present

## 2024-06-13 DIAGNOSIS — K219 Gastro-esophageal reflux disease without esophagitis: Secondary | ICD-10-CM | POA: Diagnosis not present

## 2024-06-13 DIAGNOSIS — E785 Hyperlipidemia, unspecified: Secondary | ICD-10-CM | POA: Diagnosis not present

## 2024-06-13 DIAGNOSIS — D860 Sarcoidosis of lung: Secondary | ICD-10-CM | POA: Diagnosis not present

## 2024-06-13 DIAGNOSIS — D649 Anemia, unspecified: Secondary | ICD-10-CM | POA: Diagnosis not present

## 2024-06-13 DIAGNOSIS — I10 Essential (primary) hypertension: Secondary | ICD-10-CM | POA: Diagnosis not present

## 2024-06-13 DIAGNOSIS — G620 Drug-induced polyneuropathy: Secondary | ICD-10-CM | POA: Diagnosis not present

## 2024-06-13 DIAGNOSIS — G250 Essential tremor: Secondary | ICD-10-CM | POA: Diagnosis not present

## 2024-06-13 DIAGNOSIS — I4729 Other ventricular tachycardia: Secondary | ICD-10-CM | POA: Diagnosis not present

## 2024-06-13 NOTE — Telephone Encounter (Signed)
 Cal to [place], no answer. Left message with verbal orders as requested

## 2024-06-13 NOTE — Telephone Encounter (Signed)
 Lori from [facility] called needing VO for PT  1 x 8w for Strength and Balance. Please advise.

## 2024-06-14 NOTE — Telephone Encounter (Signed)
 Lori  From [facility] called back , Requested to speak to Nurse , Katheryn Just up

## 2024-06-20 DIAGNOSIS — E785 Hyperlipidemia, unspecified: Secondary | ICD-10-CM | POA: Diagnosis not present

## 2024-06-20 DIAGNOSIS — K219 Gastro-esophageal reflux disease without esophagitis: Secondary | ICD-10-CM | POA: Diagnosis not present

## 2024-06-20 DIAGNOSIS — G250 Essential tremor: Secondary | ICD-10-CM | POA: Diagnosis not present

## 2024-06-20 DIAGNOSIS — F419 Anxiety disorder, unspecified: Secondary | ICD-10-CM | POA: Diagnosis not present

## 2024-06-20 DIAGNOSIS — G620 Drug-induced polyneuropathy: Secondary | ICD-10-CM | POA: Diagnosis not present

## 2024-06-20 DIAGNOSIS — I4729 Other ventricular tachycardia: Secondary | ICD-10-CM | POA: Diagnosis not present

## 2024-06-20 DIAGNOSIS — D649 Anemia, unspecified: Secondary | ICD-10-CM | POA: Diagnosis not present

## 2024-06-20 DIAGNOSIS — I10 Essential (primary) hypertension: Secondary | ICD-10-CM | POA: Diagnosis not present

## 2024-06-20 DIAGNOSIS — D860 Sarcoidosis of lung: Secondary | ICD-10-CM | POA: Diagnosis not present

## 2024-06-21 DIAGNOSIS — F419 Anxiety disorder, unspecified: Secondary | ICD-10-CM | POA: Diagnosis not present

## 2024-06-21 DIAGNOSIS — I10 Essential (primary) hypertension: Secondary | ICD-10-CM | POA: Diagnosis not present

## 2024-06-21 DIAGNOSIS — D869 Sarcoidosis, unspecified: Secondary | ICD-10-CM | POA: Diagnosis not present

## 2024-06-21 DIAGNOSIS — R110 Nausea: Secondary | ICD-10-CM | POA: Diagnosis not present

## 2024-06-21 DIAGNOSIS — J449 Chronic obstructive pulmonary disease, unspecified: Secondary | ICD-10-CM | POA: Diagnosis not present

## 2024-06-21 DIAGNOSIS — K219 Gastro-esophageal reflux disease without esophagitis: Secondary | ICD-10-CM | POA: Diagnosis not present

## 2024-06-21 DIAGNOSIS — R42 Dizziness and giddiness: Secondary | ICD-10-CM | POA: Diagnosis not present

## 2024-06-29 DIAGNOSIS — G620 Drug-induced polyneuropathy: Secondary | ICD-10-CM | POA: Diagnosis not present

## 2024-06-29 DIAGNOSIS — I10 Essential (primary) hypertension: Secondary | ICD-10-CM | POA: Diagnosis not present

## 2024-06-29 DIAGNOSIS — E785 Hyperlipidemia, unspecified: Secondary | ICD-10-CM | POA: Diagnosis not present

## 2024-06-29 DIAGNOSIS — K219 Gastro-esophageal reflux disease without esophagitis: Secondary | ICD-10-CM | POA: Diagnosis not present

## 2024-06-29 DIAGNOSIS — G250 Essential tremor: Secondary | ICD-10-CM | POA: Diagnosis not present

## 2024-06-29 DIAGNOSIS — F419 Anxiety disorder, unspecified: Secondary | ICD-10-CM | POA: Diagnosis not present

## 2024-06-29 DIAGNOSIS — D649 Anemia, unspecified: Secondary | ICD-10-CM | POA: Diagnosis not present

## 2024-06-29 DIAGNOSIS — D860 Sarcoidosis of lung: Secondary | ICD-10-CM | POA: Diagnosis not present

## 2024-06-29 DIAGNOSIS — I4729 Other ventricular tachycardia: Secondary | ICD-10-CM | POA: Diagnosis not present

## 2024-07-06 DIAGNOSIS — G620 Drug-induced polyneuropathy: Secondary | ICD-10-CM | POA: Diagnosis not present

## 2024-07-06 DIAGNOSIS — E785 Hyperlipidemia, unspecified: Secondary | ICD-10-CM | POA: Diagnosis not present

## 2024-07-06 DIAGNOSIS — I4729 Other ventricular tachycardia: Secondary | ICD-10-CM | POA: Diagnosis not present

## 2024-07-06 DIAGNOSIS — D649 Anemia, unspecified: Secondary | ICD-10-CM | POA: Diagnosis not present

## 2024-07-06 DIAGNOSIS — I10 Essential (primary) hypertension: Secondary | ICD-10-CM | POA: Diagnosis not present

## 2024-07-06 DIAGNOSIS — F419 Anxiety disorder, unspecified: Secondary | ICD-10-CM | POA: Diagnosis not present

## 2024-07-06 DIAGNOSIS — G250 Essential tremor: Secondary | ICD-10-CM | POA: Diagnosis not present

## 2024-07-06 DIAGNOSIS — D860 Sarcoidosis of lung: Secondary | ICD-10-CM | POA: Diagnosis not present

## 2024-07-06 DIAGNOSIS — K219 Gastro-esophageal reflux disease without esophagitis: Secondary | ICD-10-CM | POA: Diagnosis not present

## 2024-07-07 DIAGNOSIS — R030 Elevated blood-pressure reading, without diagnosis of hypertension: Secondary | ICD-10-CM | POA: Diagnosis not present

## 2024-07-07 DIAGNOSIS — I10 Essential (primary) hypertension: Secondary | ICD-10-CM | POA: Diagnosis not present

## 2024-07-07 DIAGNOSIS — R634 Abnormal weight loss: Secondary | ICD-10-CM | POA: Diagnosis not present

## 2024-07-07 DIAGNOSIS — F419 Anxiety disorder, unspecified: Secondary | ICD-10-CM | POA: Diagnosis not present

## 2024-07-07 DIAGNOSIS — E785 Hyperlipidemia, unspecified: Secondary | ICD-10-CM | POA: Diagnosis not present

## 2024-07-11 DIAGNOSIS — E785 Hyperlipidemia, unspecified: Secondary | ICD-10-CM | POA: Diagnosis not present

## 2024-07-11 DIAGNOSIS — G250 Essential tremor: Secondary | ICD-10-CM | POA: Diagnosis not present

## 2024-07-11 DIAGNOSIS — K219 Gastro-esophageal reflux disease without esophagitis: Secondary | ICD-10-CM | POA: Diagnosis not present

## 2024-07-11 DIAGNOSIS — D860 Sarcoidosis of lung: Secondary | ICD-10-CM | POA: Diagnosis not present

## 2024-07-11 DIAGNOSIS — D649 Anemia, unspecified: Secondary | ICD-10-CM | POA: Diagnosis not present

## 2024-07-11 DIAGNOSIS — I10 Essential (primary) hypertension: Secondary | ICD-10-CM | POA: Diagnosis not present

## 2024-07-11 DIAGNOSIS — G620 Drug-induced polyneuropathy: Secondary | ICD-10-CM | POA: Diagnosis not present

## 2024-07-11 DIAGNOSIS — I4729 Other ventricular tachycardia: Secondary | ICD-10-CM | POA: Diagnosis not present

## 2024-07-11 DIAGNOSIS — F419 Anxiety disorder, unspecified: Secondary | ICD-10-CM | POA: Diagnosis not present

## 2024-07-12 DIAGNOSIS — H4051X3 Glaucoma secondary to other eye disorders, right eye, severe stage: Secondary | ICD-10-CM | POA: Diagnosis not present

## 2024-07-13 DIAGNOSIS — K219 Gastro-esophageal reflux disease without esophagitis: Secondary | ICD-10-CM | POA: Diagnosis not present

## 2024-07-13 DIAGNOSIS — D649 Anemia, unspecified: Secondary | ICD-10-CM | POA: Diagnosis not present

## 2024-07-13 DIAGNOSIS — F419 Anxiety disorder, unspecified: Secondary | ICD-10-CM | POA: Diagnosis not present

## 2024-07-13 DIAGNOSIS — I4729 Other ventricular tachycardia: Secondary | ICD-10-CM | POA: Diagnosis not present

## 2024-07-13 DIAGNOSIS — E785 Hyperlipidemia, unspecified: Secondary | ICD-10-CM | POA: Diagnosis not present

## 2024-07-13 DIAGNOSIS — G620 Drug-induced polyneuropathy: Secondary | ICD-10-CM | POA: Diagnosis not present

## 2024-07-13 DIAGNOSIS — I10 Essential (primary) hypertension: Secondary | ICD-10-CM | POA: Diagnosis not present

## 2024-07-13 DIAGNOSIS — D860 Sarcoidosis of lung: Secondary | ICD-10-CM | POA: Diagnosis not present

## 2024-07-17 NOTE — Telephone Encounter (Signed)
 Orders faxed to [facility] with signature

## 2024-07-19 DIAGNOSIS — D860 Sarcoidosis of lung: Secondary | ICD-10-CM | POA: Diagnosis not present

## 2024-07-19 DIAGNOSIS — D649 Anemia, unspecified: Secondary | ICD-10-CM | POA: Diagnosis not present

## 2024-07-19 DIAGNOSIS — K219 Gastro-esophageal reflux disease without esophagitis: Secondary | ICD-10-CM | POA: Diagnosis not present

## 2024-07-19 DIAGNOSIS — I10 Essential (primary) hypertension: Secondary | ICD-10-CM | POA: Diagnosis not present

## 2024-07-19 DIAGNOSIS — F419 Anxiety disorder, unspecified: Secondary | ICD-10-CM | POA: Diagnosis not present

## 2024-07-19 DIAGNOSIS — E785 Hyperlipidemia, unspecified: Secondary | ICD-10-CM | POA: Diagnosis not present

## 2024-07-19 DIAGNOSIS — I4729 Other ventricular tachycardia: Secondary | ICD-10-CM | POA: Diagnosis not present

## 2024-07-19 DIAGNOSIS — G250 Essential tremor: Secondary | ICD-10-CM | POA: Diagnosis not present

## 2024-07-19 DIAGNOSIS — G620 Drug-induced polyneuropathy: Secondary | ICD-10-CM | POA: Diagnosis not present

## 2024-07-21 DIAGNOSIS — G629 Polyneuropathy, unspecified: Secondary | ICD-10-CM | POA: Diagnosis not present

## 2024-07-21 DIAGNOSIS — F419 Anxiety disorder, unspecified: Secondary | ICD-10-CM | POA: Diagnosis not present

## 2024-07-21 DIAGNOSIS — R634 Abnormal weight loss: Secondary | ICD-10-CM | POA: Diagnosis not present

## 2024-07-21 DIAGNOSIS — I10 Essential (primary) hypertension: Secondary | ICD-10-CM | POA: Diagnosis not present

## 2024-07-21 DIAGNOSIS — M7989 Other specified soft tissue disorders: Secondary | ICD-10-CM | POA: Diagnosis not present

## 2024-07-26 DIAGNOSIS — D860 Sarcoidosis of lung: Secondary | ICD-10-CM | POA: Diagnosis not present

## 2024-07-26 DIAGNOSIS — F419 Anxiety disorder, unspecified: Secondary | ICD-10-CM | POA: Diagnosis not present

## 2024-07-26 DIAGNOSIS — D649 Anemia, unspecified: Secondary | ICD-10-CM | POA: Diagnosis not present

## 2024-07-26 DIAGNOSIS — I10 Essential (primary) hypertension: Secondary | ICD-10-CM | POA: Diagnosis not present

## 2024-07-26 DIAGNOSIS — G250 Essential tremor: Secondary | ICD-10-CM | POA: Diagnosis not present

## 2024-07-26 DIAGNOSIS — E785 Hyperlipidemia, unspecified: Secondary | ICD-10-CM | POA: Diagnosis not present

## 2024-07-26 DIAGNOSIS — I4729 Other ventricular tachycardia: Secondary | ICD-10-CM | POA: Diagnosis not present

## 2024-07-26 DIAGNOSIS — K219 Gastro-esophageal reflux disease without esophagitis: Secondary | ICD-10-CM | POA: Diagnosis not present

## 2024-08-02 ENCOUNTER — Telehealth: Payer: Self-pay | Admitting: Cardiovascular Disease

## 2024-08-02 NOTE — Telephone Encounter (Signed)
 Spoke with patient. She picked up prescription for valsartan  40mg  daily. CANDIE Bohr APP with PCP office decreased valsartan  to 20mg  daily 2 weeks ago.   Advised we have not refilled this med since 11/2023.  Advised she should follow PCP advice for medications.  She had lab work at PCP office a few weeks ago.    Of note, there were instructions from 01/2024 that were provided by PA and routed to operator. Unclear if message was relayed. Reassuring that patient has seen PCP office in the interim.   She is due for 6 month ROV -- scheduled for 12/9 @ 11:40am with Dr. Francyne Advised she bring a list of her home meds OR list. Advised that we need to get her meds updated given changes.

## 2024-08-02 NOTE — Telephone Encounter (Signed)
 Pt c/o medication issue:  1. Name of Medication: potassium chloride (KLOR-CON) 10 MEQ tablet [[id number]]  DISCONTINUED   2. How are you currently taking this medication (dosage and times per day)? N/a  3. Are you having a reaction (difficulty breathing--STAT)? No  4. What is your medication issue? Pt is not sure if she should still be taking potassium, and at what dosage. Please advise.

## 2024-08-22 DIAGNOSIS — Z961 Presence of intraocular lens: Secondary | ICD-10-CM | POA: Diagnosis not present

## 2024-08-22 DIAGNOSIS — H3552 Pigmentary retinal dystrophy: Secondary | ICD-10-CM | POA: Diagnosis not present

## 2024-08-22 DIAGNOSIS — D869 Sarcoidosis, unspecified: Secondary | ICD-10-CM | POA: Diagnosis not present

## 2024-08-22 DIAGNOSIS — H3581 Retinal edema: Secondary | ICD-10-CM | POA: Diagnosis not present

## 2024-08-25 DIAGNOSIS — F419 Anxiety disorder, unspecified: Secondary | ICD-10-CM | POA: Diagnosis not present

## 2024-08-25 DIAGNOSIS — M159 Polyosteoarthritis, unspecified: Secondary | ICD-10-CM | POA: Diagnosis not present

## 2024-08-25 DIAGNOSIS — E785 Hyperlipidemia, unspecified: Secondary | ICD-10-CM | POA: Diagnosis not present

## 2024-08-25 DIAGNOSIS — I119 Hypertensive heart disease without heart failure: Secondary | ICD-10-CM | POA: Diagnosis not present

## 2024-08-25 DIAGNOSIS — R42 Dizziness and giddiness: Secondary | ICD-10-CM | POA: Diagnosis not present

## 2024-08-25 DIAGNOSIS — C50919 Malignant neoplasm of unspecified site of unspecified female breast: Secondary | ICD-10-CM | POA: Diagnosis not present

## 2024-08-25 DIAGNOSIS — R110 Nausea: Secondary | ICD-10-CM | POA: Diagnosis not present

## 2024-08-25 DIAGNOSIS — J449 Chronic obstructive pulmonary disease, unspecified: Secondary | ICD-10-CM | POA: Diagnosis not present

## 2024-08-31 DIAGNOSIS — H903 Sensorineural hearing loss, bilateral: Secondary | ICD-10-CM | POA: Diagnosis not present

## 2024-09-20 ENCOUNTER — Ambulatory Visit: Admitting: Podiatry

## 2024-09-20 ENCOUNTER — Encounter: Payer: Self-pay | Admitting: Podiatry

## 2024-09-20 DIAGNOSIS — B351 Tinea unguium: Secondary | ICD-10-CM

## 2024-09-20 DIAGNOSIS — M79676 Pain in unspecified toe(s): Secondary | ICD-10-CM

## 2024-09-20 DIAGNOSIS — L84 Corns and callosities: Secondary | ICD-10-CM

## 2024-09-20 DIAGNOSIS — D689 Coagulation defect, unspecified: Secondary | ICD-10-CM

## 2024-09-24 NOTE — Progress Notes (Signed)
 Subjective:  Patient ID: Danielle [place]  Peters, female    DOB: January 08, 1941,  MRN: [id number]  Danielle [place]  Peters presents to clinic today for at risk foot care with h/o clotting disorder and preulcerative lesion(s) of both feet. Pain prevent(s) comfortable ambulation. Aggravating factor is weightbearing with and without shoegear. Symptoms relieved with periodic professional debridement. She is accompanied by caregiver, Dorien.  New problem(s): None.   PCP is Avva, Ravisankar, MD. ARNETTA 07/07/24.  Allergies  Allergen Reactions   Dextromethorphan Polistirex Er Other (See Comments)    SOB   Primidone      Severe Headaches   Albuterol      REACTION: tachycardia   Amoxicillin Diarrhea, Nausea Only and Other (See Comments)   Aspirin Other (See Comments)    Adult dose   Atorvastatin Other (See Comments)   Celecoxib Other (See Comments)   Clonazepam  Hives and Other (See Comments)   Doxycycline  Hyclate Other (See Comments)   Duloxetine  Nausea And Vomiting    dizzy   Levaquin [Levofloxacin] Other (See Comments)    Per patient, caused tendonitis.    Loratadine    Nortriptyline      Increase heart rate.   Propranolol  Hcl Other (See Comments)    Dizziness, Fatgue   Simvastatin     Myalgias    Ezetimibe  Rash    Review of Systems: Negative except as noted in the HPI.  Objective: No changes noted in today's physical examination. There were no vitals filed for this visit. Danielle [place]  Peters is a pleasant 83 y.o. female in NAD. AAO x 3.  Vascular Examination: Capillary refill time immediate b/l. Palpable pedal pulses. Pedal hair absent  b/l. Pedal edema absent. No pain with calf compression b/l. Skin temperature gradient WNL b/l. No cyanosis or clubbing b/l. No ischemia or gangrene noted b/l.   Neurological Examination: Sensation grossly intact b/l with 10 gram monofilament. Vibratory sensation intact b/l. Pt has subjective symptoms of neuropathy.  Dermatological  Examination: Pedal skin with normal turgor, texture and tone b/l.  No open wounds. No interdigital macerations.   Toenails 1-5 b/l thick, discolored, elongated with subungual debris and pain on dorsal palpation.   Preulcerative lesion noted left heel and dorsal PIPJ of R 5th toe. There is visible subdermal hemorrhage. There is no surrounding erythema, no edema, no drainage, no odor, no fluctuance.  Musculoskeletal Examination: Muscle strength 5/5 to all lower extremity muscle groups bilaterally. Hammertoe(s) b/l 5th toes.  Radiographs: None  Assessment/Plan: 1. Pain due to onychomycosis of toenail   2. Pre-ulcerative calluses   3. Clotting disorder   -Consent given for treatment as described below: -Examined patient. -Patient to continue soft, supportive shoe gear daily. -Mycotic toenails 1-5 bilaterally were debrided in length and girth with sterile nail nippers and dremel without incident. -Corn(s) dorsal PIPJ of R 5th toe pared utilizing sharp debridement with sterile blade without complication or incident. Total number debrided=1. -Preulcerative lesion pared posterior aspect of left heel utilizing sterile scalpel blade. Total number pared=1. -Patient/POA to call should there be question/concern in the interim.   Return in about 3 months (around 12/19/2024).  Delon LITTIE Merlin, DPM       LOCATION: [address], [place] [address]  Office [phone]   [facility] LOCATION: [address] [place], [place] [address] Office [phone]

## 2024-09-26 ENCOUNTER — Ambulatory Visit: Attending: Cardiovascular Disease | Admitting: Cardiovascular Disease

## 2024-09-26 ENCOUNTER — Encounter: Payer: Self-pay | Admitting: Cardiovascular Disease

## 2024-09-26 VITALS — BP 139/54 | HR 85 | Ht 64.0 in | Wt 135.1 lb

## 2024-09-26 DIAGNOSIS — D860 Sarcoidosis of lung: Secondary | ICD-10-CM | POA: Diagnosis not present

## 2024-09-26 DIAGNOSIS — E7800 Pure hypercholesterolemia, unspecified: Secondary | ICD-10-CM | POA: Diagnosis not present

## 2024-09-26 DIAGNOSIS — I10 Essential (primary) hypertension: Secondary | ICD-10-CM

## 2024-09-26 DIAGNOSIS — I471 Supraventricular tachycardia, unspecified: Secondary | ICD-10-CM | POA: Diagnosis not present

## 2024-09-26 DIAGNOSIS — I447 Left bundle-branch block, unspecified: Secondary | ICD-10-CM | POA: Diagnosis not present

## 2024-09-26 NOTE — Patient Instructions (Signed)

## 2024-09-26 NOTE — Progress Notes (Signed)
 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  She has not had any recent cardiac complaints.  She denies issues with chest pain or with palpitations over the last year.  She has not experienced syncope.  She describes leg swelling in the upper half of her calf, on the lateral surface, but not ankle or pedal edema.  She does not have orthopnea or PND.    Her echocardiogram in January 2024 was essentially normal with the exception of age-related diastolic dysfunction and mild increase in estimated PA pressure of 41 mmHg.  EF was 60 to 65%. E/e' was indeterminate (11-13).  CT of the chest in 2019 shows minimal coronary artery calcifications.  She had normal perfusion on nuclear stress test in 2020.  She has had trouble with multiple lipid-lowering medications including atorvastatin, simvastatin, ezetimibe  and rosuvastatin .  She has done okay with pravastatin  but only on a longer dose of 20 mg daily.  She reports that higher doses messed with her blood pressure.  She is not sure whether she was taking any lipid-lowering medicine when she had her labs performed in March 2025 when she had a total cholesterol 274, HDL 94, LDL 162, triglycerides 92  She has a longstanding history of inactive sarcoidosis and chronic bronchitis(saw Dr. Neysa 02/25/2024).  She is having continuing issues with a cough productive of thick secretions but is no longer using inhalers.  Unfortunately her hand  tremor makes it very difficult for her to use inhaler successfully.  She was doing okay with Breo but her insurance company will not cover it any longer.  Past Medical History:  Diagnosis Date   Anemia    Anxiety    Breast cancer (HCC) 2004   right   Cancer [facility])    breast ca   Chest pain    echo 01/08/10- nl lv; mild aortic sclerosis; myoview  01/08/10- no ischemia   Dyslipidemia    myaligia with statins   Esophageal reflux    Hypertension    Neuropathy    Personal history of chemotherapy 2004   Personal history of radiation therapy 2004   Pulmonary sarcoidosis    Shingles 03/2016   Tremor    upper extremities   Ventricular tachycardia (paroxysmal) (HCC)    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; AV node reentry tachycardia ablation    Past Surgical History:  Procedure Laterality Date   Boil Right 03/2015   on buttock, excision   BREAST LUMPECTOMY Right 2004   CHOLECYSTECTOMY     left breast lumpectomy,chemo  2004   xrt   SKIN BIOPSY     TUBAL LIGATION     UPPER GI ENDOSCOPY  02/2015   difficulty swallowing, Abd pain     Current Meds  Medication Sig   carboxymethylcellulose (REFRESH PLUS) 0.5 % SOLN Place 1 drop into both eyes daily as needed.   fexofenadine (ALLEGRA) 60 MG tablet Take 60 mg by mouth 2 (two) times daily.   fluticasone  (FLONASE ) 50 MCG/ACT nasal spray SPRAY 2 SPRAYS INTO EACH NOSTRIL EVERY  DAY   hydrochlorothiazide  (MICROZIDE ) 12.5 MG capsule Take 1 capsule (12.5 mg total) by mouth daily.   pantoprazole (PROTONIX) 40 MG tablet Take 40 mg by mouth 2 (two) times daily.   pravastatin  (PRAVACHOL ) 20 MG tablet Take 10 mg by mouth daily.   prednisoLONE acetate (PRED FORTE) 1 % ophthalmic suspension Apply to eye.   primidone  (MYSOLINE ) 50 MG tablet Take 2 tablets (100 mg total) by mouth in the morning and at bedtime.   Probiotic Product (RESTORA) CAPS Take 1 capsule by mouth daily at 6 (six) AM.   valsartan  (DIOVAN ) 40 MG tablet Take 1 tablet (40 mg total) by mouth  daily.   [DISCONTINUED] pravastatin  (PRAVACHOL ) 40 MG tablet Take 40 mg by mouth daily.   Current Facility-Administered Medications for the 09/26/24 encounter (Office Visit) with Taelyn Nemes, Jerel, MD  Medication   albuterol  (PROVENTIL ) (2.5 MG/3ML) 0.083% nebulizer solution 2.5 mg     Allergies:   Dextromethorphan polistirex er, Primidone , Albuterol , Amoxicillin, Aspirin, Atorvastatin, Celecoxib, Clonazepam , Doxycycline  hyclate, Duloxetine , Levaquin [levofloxacin], Loratadine, Nortriptyline , Propranolol  hcl, Simvastatin, and Ezetimibe    Social History   Tobacco Use   Smoking status: Never   Smokeless tobacco: Never  Vaping Use   Vaping status: Never Used  Substance Use Topics   Alcohol  use: No   Drug use: No     Family Hx: The patient's family history includes Cancer in her mother; Heart attack in her sister; Heart failure (age of onset: 60) in her father. There is no history of Breast cancer or BRCA 1/2.  ROS:   Please see the history of present illness.   All other systems are reviewed and are negative.   Prior CV studies:   The following studies were reviewed today: Pharmacological stress test November 28, 2018  Labs/Other Tests and Data Reviewed:    EKG:   EKG Interpretation Date/Time:  Tuesday September 26 2024 11:54:01 EST Ventricular Rate:  96 PR Interval:  152 QRS Duration:  130 QT Interval:  384 QTC Calculation: 485 R Axis:   6  Text Interpretation: Normal sinus rhythm Left bundle branch block When compared with ECG of 18-Jan-2024 10:34, No significant change since last tracing Confirmed by Zykiria Bruening [phone]) on 09/26/2024 12:10:23 PM         Recent Labs: No results found for requested labs within last 365 days.   Recent Lipid Panel No results found for: CHOL, TRIG, HDL, CHOLHDL, LDLCALC, LDLDIRECT  05/17/2020 Chol 277, HDL 88, LDL170, TG 94 Creatinine 0.8, hemoglobin 12.8, TSH 1.34 07/18/2021 Cholesterol 271, HDL 74, LDL 175,  triglycerides 112 Hemoglobin A1c 5.8%, potassium 3.8, ALT 13 12/21/2023 Cholesterol 274, HDL 94, LDL 162, triglycerides 92 Creatinine 0.97, potassium 4.7  Wt Readings from Last 3 Encounters:  09/26/24 135 lb 1.6 oz (61.3 kg)  05/30/24 129 lb (58.5 kg)  02/25/24 136 lb 9.6 oz (62 kg)     Objective:    Vital Signs:  BP (!) 139/54   Pulse 85   Ht 5' 4 (1.626 m)   Wt 135 lb 1.6 oz (61.3 kg)   SpO2 96%   BMI 23.19 kg/m      General: Alert, oriented x3, no distress, appears well Head: no evidence of trauma, PERRL, EOMI, no exophtalmos or lid lag, no myxedema, no xanthelasma; normal ears, nose and oropharynx Neck: normal jugular venous pulsations and no hepatojugular reflux; brisk carotid pulses without delay and no carotid bruits Chest: Bilateral pleural rubs and squeaky wheezes in the bases Cardiovascular: normal position and  quality of the apical impulse, regular rhythm, normal first and second heart sounds, no murmurs, rubs or gallops Abdomen: no tenderness or distention, no masses by palpation, no abnormal pulsatility or arterial bruits, normal bowel sounds, no hepatosplenomegaly Extremities: no clubbing, cyanosis or edema; 2+ radial, ulnar and brachial pulses bilaterally; 2+ right femoral, posterior tibial and dorsalis pedis pulses; 2+ left femoral, posterior tibial and dorsalis pedis pulses; no subclavian or femoral bruits Neurological: grossly nonfocal Psych: Normal mood and affect      ASSESSMENT & PLAN:    1. Essential hypertension   2. SVT (supraventricular tachycardia)   3. Hypercholesterolemia   4. LBBB (left bundle branch block)   5. Pulmonary sarcoidosis       HTN/orthostatic hypotension: On very low-dose valsartan .  Dizziness is not a current complaint.  Diastolic blood pressure remains rather low.  Seems to be less symptomatic. Tolerate systolic blood pressure as high as 150 due to her tendency to have orthostatic hypotension.  Avoid diuretics as much as  possible. Palpitations: Used to have problems with SVT and took propranolol , but for several years now she has not had any symptomatic recurrence despite not taking any beta-blockers. HLP: Intolerant of atorva/Simva/Rosuva/pravastatin  in doses greater than 20 mg.  Had a rash with ezetimibe .  No clinically evident CAD or PAD.  Only minimal coronary calcification seen on a previous CT of the chest.  Will manage conservatively.  Continue pravastatin  20 mg at bedtime.  Recheck labs with PCP. LBBB: Has not had evidence of cardiomyopathy or congestive heart failure or any symptoms to suggest higher grade AV block.  The LBBB was previously intermittent, now permanent.   Pulmonary sarcoidosis: Currently quiescent.  Followed by Dr. Neysa. Essential tremor: Was seeing Dr. Onita.  On primidone .  Patient Instructions  Medication Instructions:  No changes *If you need a refill on your cardiac medications before your next appointment, please call your pharmacy*  Lab Work: None ordered If you have labs (blood work) drawn today and your tests are completely normal, you will receive your results only by: MyChart Message (if you have MyChart) OR A paper copy in the mail If you have any lab test that is abnormal or we need to change your treatment, we will call you to review the results.  Testing/Procedures: None ordered  Follow-Up: At [facility], you and your health needs are our priority.  As part of our continuing mission to provide you with exceptional heart care, our providers are all part of one team.  This team includes your primary Cardiologist (physician) and Advanced Practice Providers or APPs (Physician Assistants and Nurse Practitioners) who all work together to provide you with the care you need, when you need it.  Your next appointment:   1 year(s)  Provider:   Jerel Balding, MD    We recommend signing up for the patient portal called MyChart.  Sign up information is provided on  this After Visit Summary.  MyChart is used to connect with patients for Virtual Visits (Telemedicine).  Patients are able to view lab/test results, encounter notes, upcoming appointments, etc.  Non-urgent messages can be sent to your provider as well.   To learn more about what you can do with MyChart, go to [email or web address].     Signed, Jerel Balding, MD  09/26/2024 1:24 PM    [facility]

## 2025-01-09 ENCOUNTER — Ambulatory Visit: Admitting: Podiatry
